# Patient Record
Sex: Male | Born: 1950
Health system: Southern US, Community
[De-identification: ages and names within clinical notes are randomized; demographics above are authoritative.]

## PROBLEM LIST (undated history)

## (undated) DIAGNOSIS — G959 Disease of spinal cord, unspecified: Secondary | ICD-10-CM

## (undated) DIAGNOSIS — I4891 Unspecified atrial fibrillation: Secondary | ICD-10-CM

## (undated) HISTORY — PX: APPENDECTOMY: SHX54

## (undated) HISTORY — PX: BACK SURGERY: SHX140

---

## 2005-10-25 ENCOUNTER — Ambulatory Visit: Payer: Self-pay | Admitting: Family Medicine

## 2005-11-01 ENCOUNTER — Ambulatory Visit: Payer: Self-pay | Admitting: Family Medicine

## 2005-11-16 ENCOUNTER — Ambulatory Visit: Payer: Self-pay | Admitting: Family Medicine

## 2006-09-06 HISTORY — PX: ANTERIOR CERVICAL DECOMP/DISCECTOMY FUSION: SHX1161

## 2018-07-31 DIAGNOSIS — F322 Major depressive disorder, single episode, severe without psychotic features: Secondary | ICD-10-CM | POA: Diagnosis not present

## 2018-07-31 DIAGNOSIS — G4701 Insomnia due to medical condition: Secondary | ICD-10-CM | POA: Diagnosis not present

## 2018-07-31 DIAGNOSIS — R252 Cramp and spasm: Secondary | ICD-10-CM | POA: Diagnosis not present

## 2018-08-16 DIAGNOSIS — F325 Major depressive disorder, single episode, in full remission: Secondary | ICD-10-CM | POA: Diagnosis not present

## 2018-08-16 DIAGNOSIS — G4701 Insomnia due to medical condition: Secondary | ICD-10-CM | POA: Diagnosis not present

## 2018-08-16 DIAGNOSIS — I712 Thoracic aortic aneurysm, without rupture: Secondary | ICD-10-CM | POA: Diagnosis not present

## 2018-08-16 DIAGNOSIS — Z23 Encounter for immunization: Secondary | ICD-10-CM | POA: Diagnosis not present

## 2018-08-16 DIAGNOSIS — R252 Cramp and spasm: Secondary | ICD-10-CM | POA: Diagnosis not present

## 2018-08-16 DIAGNOSIS — Z0000 Encounter for general adult medical examination without abnormal findings: Secondary | ICD-10-CM | POA: Diagnosis not present

## 2018-08-16 DIAGNOSIS — E782 Mixed hyperlipidemia: Secondary | ICD-10-CM | POA: Diagnosis not present

## 2018-08-16 DIAGNOSIS — I480 Paroxysmal atrial fibrillation: Secondary | ICD-10-CM | POA: Diagnosis not present

## 2021-09-06 DIAGNOSIS — M48061 Spinal stenosis, lumbar region without neurogenic claudication: Secondary | ICD-10-CM

## 2021-09-06 HISTORY — DX: Spinal stenosis, lumbar region without neurogenic claudication: M48.061

## 2021-12-03 ENCOUNTER — Emergency Department (HOSPITAL_COMMUNITY): Payer: Medicare HMO

## 2021-12-03 ENCOUNTER — Emergency Department (HOSPITAL_COMMUNITY): Payer: Medicare HMO | Admitting: Certified Registered Nurse Anesthetist

## 2021-12-03 ENCOUNTER — Encounter (HOSPITAL_COMMUNITY): Payer: Self-pay | Admitting: Neurology

## 2021-12-03 ENCOUNTER — Encounter (HOSPITAL_COMMUNITY)
Admission: EM | Disposition: A | Discharge status: Rehabilitation Facility | Payer: Self-pay | Source: Home / Self Care | Attending: Neurology

## 2021-12-03 ENCOUNTER — Other Ambulatory Visit: Payer: Self-pay

## 2021-12-03 ENCOUNTER — Inpatient Hospital Stay (HOSPITAL_COMMUNITY)
Admission: EM | Admit: 2021-12-03 | Discharge: 2021-12-08 | DRG: 023 | Disposition: A | Discharge status: Rehabilitation Facility | Payer: Medicare HMO | Attending: Neurology | Admitting: Neurology

## 2021-12-03 DIAGNOSIS — S01312A Laceration without foreign body of left ear, initial encounter: Secondary | ICD-10-CM | POA: Diagnosis present

## 2021-12-03 DIAGNOSIS — R7301 Impaired fasting glucose: Secondary | ICD-10-CM | POA: Diagnosis present

## 2021-12-03 DIAGNOSIS — I6601 Occlusion and stenosis of right middle cerebral artery: Secondary | ICD-10-CM | POA: Diagnosis not present

## 2021-12-03 DIAGNOSIS — R29810 Facial weakness: Secondary | ICD-10-CM | POA: Diagnosis present

## 2021-12-03 DIAGNOSIS — I712 Thoracic aortic aneurysm, without rupture, unspecified: Secondary | ICD-10-CM

## 2021-12-03 DIAGNOSIS — Z880 Allergy status to penicillin: Secondary | ICD-10-CM

## 2021-12-03 DIAGNOSIS — I63511 Cerebral infarction due to unspecified occlusion or stenosis of right middle cerebral artery: Principal | ICD-10-CM | POA: Diagnosis present

## 2021-12-03 DIAGNOSIS — I7121 Aneurysm of the ascending aorta, without rupture: Secondary | ICD-10-CM | POA: Diagnosis present

## 2021-12-03 DIAGNOSIS — Z20822 Contact with and (suspected) exposure to covid-19: Secondary | ICD-10-CM | POA: Diagnosis present

## 2021-12-03 DIAGNOSIS — H518 Other specified disorders of binocular movement: Secondary | ICD-10-CM | POA: Diagnosis not present

## 2021-12-03 DIAGNOSIS — G8194 Hemiplegia, unspecified affecting left nondominant side: Secondary | ICD-10-CM

## 2021-12-03 DIAGNOSIS — R482 Apraxia: Secondary | ICD-10-CM | POA: Diagnosis present

## 2021-12-03 DIAGNOSIS — I63411 Cerebral infarction due to embolism of right middle cerebral artery: Secondary | ICD-10-CM | POA: Diagnosis present

## 2021-12-03 DIAGNOSIS — J9811 Atelectasis: Secondary | ICD-10-CM | POA: Diagnosis not present

## 2021-12-03 DIAGNOSIS — Z981 Arthrodesis status: Secondary | ICD-10-CM

## 2021-12-03 DIAGNOSIS — M25552 Pain in left hip: Secondary | ICD-10-CM | POA: Diagnosis present

## 2021-12-03 DIAGNOSIS — Z789 Other specified health status: Secondary | ICD-10-CM

## 2021-12-03 DIAGNOSIS — W19XXXA Unspecified fall, initial encounter: Secondary | ICD-10-CM | POA: Diagnosis present

## 2021-12-03 DIAGNOSIS — I4891 Unspecified atrial fibrillation: Secondary | ICD-10-CM

## 2021-12-03 DIAGNOSIS — G936 Cerebral edema: Secondary | ICD-10-CM | POA: Diagnosis not present

## 2021-12-03 DIAGNOSIS — I63421 Cerebral infarction due to embolism of right anterior cerebral artery: Secondary | ICD-10-CM | POA: Diagnosis present

## 2021-12-03 DIAGNOSIS — I480 Paroxysmal atrial fibrillation: Secondary | ICD-10-CM

## 2021-12-03 DIAGNOSIS — R414 Neurologic neglect syndrome: Secondary | ICD-10-CM | POA: Diagnosis present

## 2021-12-03 DIAGNOSIS — Y92012 Bathroom of single-family (private) house as the place of occurrence of the external cause: Secondary | ICD-10-CM

## 2021-12-03 DIAGNOSIS — R29720 NIHSS score 20: Secondary | ICD-10-CM | POA: Diagnosis present

## 2021-12-03 DIAGNOSIS — K5900 Constipation, unspecified: Secondary | ICD-10-CM | POA: Diagnosis present

## 2021-12-03 DIAGNOSIS — D72829 Elevated white blood cell count, unspecified: Secondary | ICD-10-CM | POA: Diagnosis present

## 2021-12-03 DIAGNOSIS — I482 Chronic atrial fibrillation, unspecified: Secondary | ICD-10-CM | POA: Diagnosis present

## 2021-12-03 DIAGNOSIS — R4781 Slurred speech: Secondary | ICD-10-CM | POA: Diagnosis present

## 2021-12-03 DIAGNOSIS — J9601 Acute respiratory failure with hypoxia: Secondary | ICD-10-CM | POA: Diagnosis not present

## 2021-12-03 DIAGNOSIS — D62 Acute posthemorrhagic anemia: Secondary | ICD-10-CM | POA: Diagnosis present

## 2021-12-03 DIAGNOSIS — R471 Dysarthria and anarthria: Secondary | ICD-10-CM | POA: Diagnosis present

## 2021-12-03 DIAGNOSIS — I69391 Dysphagia following cerebral infarction: Secondary | ICD-10-CM | POA: Diagnosis not present

## 2021-12-03 DIAGNOSIS — M48061 Spinal stenosis, lumbar region without neurogenic claudication: Secondary | ICD-10-CM | POA: Diagnosis present

## 2021-12-03 DIAGNOSIS — G8114 Spastic hemiplegia affecting left nondominant side: Secondary | ICD-10-CM | POA: Diagnosis present

## 2021-12-03 DIAGNOSIS — Z87891 Personal history of nicotine dependence: Secondary | ICD-10-CM

## 2021-12-03 DIAGNOSIS — G479 Sleep disorder, unspecified: Secondary | ICD-10-CM | POA: Diagnosis not present

## 2021-12-03 DIAGNOSIS — L7632 Postprocedural hematoma of skin and subcutaneous tissue following other procedure: Secondary | ICD-10-CM | POA: Diagnosis not present

## 2021-12-03 DIAGNOSIS — I700 Atherosclerosis of aorta: Secondary | ICD-10-CM | POA: Diagnosis present

## 2021-12-03 DIAGNOSIS — E785 Hyperlipidemia, unspecified: Secondary | ICD-10-CM | POA: Diagnosis present

## 2021-12-03 DIAGNOSIS — R1312 Dysphagia, oropharyngeal phase: Secondary | ICD-10-CM | POA: Diagnosis not present

## 2021-12-03 DIAGNOSIS — I639 Cerebral infarction, unspecified: Secondary | ICD-10-CM | POA: Diagnosis not present

## 2021-12-03 DIAGNOSIS — R1310 Dysphagia, unspecified: Secondary | ICD-10-CM | POA: Diagnosis present

## 2021-12-03 DIAGNOSIS — K5901 Slow transit constipation: Secondary | ICD-10-CM | POA: Diagnosis not present

## 2021-12-03 HISTORY — PX: RADIOLOGY WITH ANESTHESIA: SHX6223

## 2021-12-03 HISTORY — PX: IR PERCUTANEOUS ART THROMBECTOMY/INFUSION INTRACRANIAL INC DIAG ANGIO: IMG6087

## 2021-12-03 HISTORY — PX: IR CT HEAD LTD: IMG2386

## 2021-12-03 HISTORY — DX: Disease of spinal cord, unspecified: G95.9

## 2021-12-03 HISTORY — DX: Unspecified atrial fibrillation: I48.91

## 2021-12-03 LAB — I-STAT CHEM 8, ED
BUN: 21 mg/dL (ref 8–23)
Calcium, Ion: 1.18 mmol/L (ref 1.15–1.40)
Chloride: 105 mmol/L (ref 98–111)
Creatinine, Ser: 1 mg/dL (ref 0.61–1.24)
Glucose, Bld: 133 mg/dL — ABNORMAL HIGH (ref 70–99)
HCT: 42 % (ref 39.0–52.0)
Hemoglobin: 14.3 g/dL (ref 13.0–17.0)
Potassium: 4.4 mmol/L (ref 3.5–5.1)
Sodium: 139 mmol/L (ref 135–145)
TCO2: 27 mmol/L (ref 22–32)

## 2021-12-03 LAB — DIFFERENTIAL
Abs Immature Granulocytes: 0.05 10*3/uL (ref 0.00–0.07)
Basophils Absolute: 0.1 10*3/uL (ref 0.0–0.1)
Basophils Relative: 1 %
Eosinophils Absolute: 0.1 10*3/uL (ref 0.0–0.5)
Eosinophils Relative: 1 %
Immature Granulocytes: 1 %
Lymphocytes Relative: 12 %
Lymphs Abs: 1.2 10*3/uL (ref 0.7–4.0)
Monocytes Absolute: 0.7 10*3/uL (ref 0.1–1.0)
Monocytes Relative: 7 %
Neutro Abs: 8.2 10*3/uL — ABNORMAL HIGH (ref 1.7–7.7)
Neutrophils Relative %: 78 %

## 2021-12-03 LAB — CBC
HCT: 42.6 % (ref 39.0–52.0)
Hemoglobin: 14.4 g/dL (ref 13.0–17.0)
MCH: 29.8 pg (ref 26.0–34.0)
MCHC: 33.8 g/dL (ref 30.0–36.0)
MCV: 88 fL (ref 80.0–100.0)
Platelets: 199 10*3/uL (ref 150–400)
RBC: 4.84 MIL/uL (ref 4.22–5.81)
RDW: 12.5 % (ref 11.5–15.5)
WBC: 10.3 10*3/uL (ref 4.0–10.5)
nRBC: 0 % (ref 0.0–0.2)

## 2021-12-03 LAB — COMPREHENSIVE METABOLIC PANEL
ALT: 24 U/L (ref 0–44)
AST: 28 U/L (ref 15–41)
Albumin: 4 g/dL (ref 3.5–5.0)
Alkaline Phosphatase: 67 U/L (ref 38–126)
Anion gap: 8 (ref 5–15)
BUN: 19 mg/dL (ref 8–23)
CO2: 24 mmol/L (ref 22–32)
Calcium: 9.4 mg/dL (ref 8.9–10.3)
Chloride: 106 mmol/L (ref 98–111)
Creatinine, Ser: 1.02 mg/dL (ref 0.61–1.24)
GFR, Estimated: >60 mL/min (ref >60)
Glucose, Bld: 134 mg/dL — ABNORMAL HIGH (ref 70–99)
Potassium: 4.4 mmol/L (ref 3.5–5.1)
Sodium: 138 mmol/L (ref 135–145)
Total Bilirubin: 0.9 mg/dL (ref 0.3–1.2)
Total Protein: 6.2 g/dL — ABNORMAL LOW (ref 6.5–8.1)

## 2021-12-03 LAB — PROTIME-INR
INR: 1.2 (ref 0.8–1.2)
Prothrombin Time: 14.8 seconds (ref 11.4–15.2)

## 2021-12-03 LAB — RESP PANEL BY RT-PCR (FLU A&B, COVID) ARPGX2
Influenza A by PCR: NEGATIVE
Influenza B by PCR: NEGATIVE
SARS Coronavirus 2 by RT PCR: NEGATIVE

## 2021-12-03 LAB — MRSA NEXT GEN BY PCR, NASAL: MRSA by PCR Next Gen: NOT DETECTED

## 2021-12-03 LAB — APTT: aPTT: 28 seconds (ref 24–36)

## 2021-12-03 LAB — CBG MONITORING, ED: Glucose-Capillary: 131 mg/dL — ABNORMAL HIGH (ref 70–99)

## 2021-12-03 SURGERY — IR WITH ANESTHESIA
Anesthesia: General

## 2021-12-03 MED ORDER — SENNOSIDES-DOCUSATE SODIUM 8.6-50 MG PO TABS: 1.0000 | ORAL_TABLET | Freq: Every evening | ORAL | Status: DC | PRN

## 2021-12-03 MED ORDER — DEXAMETHASONE SODIUM PHOSPHATE 10 MG/ML IJ SOLN
INTRAMUSCULAR | Status: DC | PRN
Start: 2021-12-03 — End: 2021-12-03
  Administered 2021-12-03: 10 mg via INTRAVENOUS

## 2021-12-03 MED ORDER — FENTANYL CITRATE PF 50 MCG/ML IJ SOSY
25.0000 ug | PREFILLED_SYRINGE | INTRAMUSCULAR | Status: DC | PRN
  Administered 2021-12-03: 25 ug via INTRAVENOUS

## 2021-12-03 MED ORDER — POLYETHYLENE GLYCOL 3350 17 G PO PACK: 17.0000 g | PACK | Freq: Every day | ORAL | Status: DC

## 2021-12-03 MED ORDER — LIDOCAINE 2% (20 MG/ML) 5 ML SYRINGE
INTRAMUSCULAR | Status: DC | PRN
  Administered 2021-12-03: 100 mg via INTRAVENOUS

## 2021-12-03 MED ORDER — ASPIRIN 81 MG PO CHEW
CHEWABLE_TABLET | ORAL | Status: AC
  Filled 2021-12-03: qty 1

## 2021-12-03 MED ORDER — PHENYLEPHRINE HCL-NACL 20-0.9 MG/250ML-% IV SOLN
INTRAVENOUS | Status: DC | PRN
Start: 2021-12-03 — End: 2021-12-03
  Administered 2021-12-03: 25 ug/min via INTRAVENOUS

## 2021-12-03 MED ORDER — ACETAMINOPHEN 325 MG PO TABS
650.0000 mg | ORAL_TABLET | ORAL | Status: DC | PRN
  Administered 2021-12-05 – 2021-12-06 (×2): 650 mg via ORAL
  Filled 2021-12-03 (×3): qty 2

## 2021-12-03 MED ORDER — FENTANYL CITRATE (PF) 250 MCG/5ML IJ SOLN
INTRAMUSCULAR | Status: DC | PRN
  Administered 2021-12-03: 100 ug via INTRAVENOUS

## 2021-12-03 MED ORDER — TICAGRELOR 90 MG PO TABS
ORAL_TABLET | ORAL | Status: AC
  Filled 2021-12-03: qty 2

## 2021-12-03 MED ORDER — PROPOFOL 10 MG/ML IV BOLUS
INTRAVENOUS | Status: DC | PRN
  Administered 2021-12-03: 100 mg via INTRAVENOUS

## 2021-12-03 MED ORDER — PROPOFOL 500 MG/50ML IV EMUL
INTRAVENOUS | Status: DC | PRN
  Administered 2021-12-03: 50 ug/kg/min via INTRAVENOUS

## 2021-12-03 MED ORDER — CEFAZOLIN SODIUM-DEXTROSE 2-4 GM/100ML-% IV SOLN
INTRAVENOUS | Status: AC
Start: 2021-12-03 — End: 2021-12-04
  Filled 2021-12-03: qty 100

## 2021-12-03 MED ORDER — PROPOFOL 500 MG/50ML IV EMUL
INTRAVENOUS | Status: AC
  Filled 2021-12-03: qty 50

## 2021-12-03 MED ORDER — IOHEXOL 300 MG/ML  SOLN
150.0000 mL | Freq: Once | INTRAMUSCULAR | Status: AC | PRN
  Administered 2021-12-03: 48 mL via INTRA_ARTERIAL

## 2021-12-03 MED ORDER — ACETAMINOPHEN 650 MG RE SUPP: 650.0000 mg | RECTAL | Status: DC | PRN

## 2021-12-03 MED ORDER — CANGRELOR TETRASODIUM 50 MG IV SOLR
INTRAVENOUS | Status: AC
  Filled 2021-12-03: qty 50

## 2021-12-03 MED ORDER — SUCCINYLCHOLINE CHLORIDE 200 MG/10ML IV SOSY
PREFILLED_SYRINGE | INTRAVENOUS | Status: DC | PRN
  Administered 2021-12-03: 120 mg via INTRAVENOUS

## 2021-12-03 MED ORDER — FENTANYL CITRATE PF 50 MCG/ML IJ SOSY
25.0000 ug | PREFILLED_SYRINGE | INTRAMUSCULAR | Status: DC | PRN
  Filled 2021-12-03: qty 1

## 2021-12-03 MED ORDER — ROCURONIUM BROMIDE 10 MG/ML (PF) SYRINGE
PREFILLED_SYRINGE | INTRAVENOUS | Status: DC | PRN
  Administered 2021-12-03: 100 mg via INTRAVENOUS

## 2021-12-03 MED ORDER — STROKE: EARLY STAGES OF RECOVERY BOOK: Freq: Once | Status: AC

## 2021-12-03 MED ORDER — TENECTEPLASE FOR STROKE
0.2500 mg/kg | PACK | Freq: Once | INTRAVENOUS | Status: AC
  Administered 2021-12-03: 24 mg via INTRAVENOUS
  Filled 2021-12-03: qty 10

## 2021-12-03 MED ORDER — CEFAZOLIN SODIUM-DEXTROSE 2-3 GM-%(50ML) IV SOLR
INTRAVENOUS | Status: DC | PRN
  Administered 2021-12-03: 2 g via INTRAVENOUS

## 2021-12-03 MED ORDER — LACTATED RINGERS IV SOLN: INTRAVENOUS | Status: DC | PRN

## 2021-12-03 MED ORDER — DOCUSATE SODIUM 50 MG/5ML PO LIQD: 100.0000 mg | Freq: Two times a day (BID) | ORAL | Status: DC

## 2021-12-03 MED ORDER — SODIUM CHLORIDE 0.9 % IV SOLN: INTRAVENOUS | Status: DC | PRN

## 2021-12-03 MED ORDER — SODIUM CHLORIDE 0.9% FLUSH: 3.0000 mL | Freq: Once | INTRAVENOUS | Status: DC

## 2021-12-03 MED ORDER — PANTOPRAZOLE SODIUM 40 MG IV SOLR
40.0000 mg | Freq: Every day | INTRAVENOUS | Status: DC
  Administered 2021-12-04 – 2021-12-06 (×3): 40 mg via INTRAVENOUS
  Filled 2021-12-03 (×3): qty 10

## 2021-12-03 MED ORDER — PROPOFOL 1000 MG/100ML IV EMUL
0.0000 ug/kg/min | INTRAVENOUS | Status: DC
  Administered 2021-12-03 – 2021-12-04 (×2): 35 ug/kg/min via INTRAVENOUS
  Administered 2021-12-04: 25 ug/kg/min via INTRAVENOUS
  Filled 2021-12-03 (×3): qty 100

## 2021-12-03 MED ORDER — ONDANSETRON HCL 4 MG/2ML IJ SOLN
INTRAMUSCULAR | Status: DC | PRN
  Administered 2021-12-03: 4 mg via INTRAVENOUS

## 2021-12-03 MED ORDER — IOHEXOL 350 MG/ML SOLN
75.0000 mL | Freq: Once | INTRAVENOUS | Status: AC | PRN
  Administered 2021-12-03: 75 mL via INTRAVENOUS

## 2021-12-03 MED ORDER — ACETAMINOPHEN 160 MG/5ML PO SOLN: 650.0000 mg | ORAL | Status: DC | PRN

## 2021-12-03 MED ORDER — NITROGLYCERIN 1 MG/10 ML FOR IR/CATH LAB
INTRA_ARTERIAL | Status: AC
  Administered 2021-12-03: 50 ug via INTRA_ARTERIAL
  Administered 2021-12-03: 25 ug via INTRA_ARTERIAL
  Filled 2021-12-03: qty 10

## 2021-12-03 MED ORDER — EPTIFIBATIDE 20 MG/10ML IV SOLN
INTRAVENOUS | Status: AC
  Filled 2021-12-03: qty 10

## 2021-12-03 MED ORDER — CLOPIDOGREL BISULFATE 300 MG PO TABS
ORAL_TABLET | ORAL | Status: AC
  Filled 2021-12-03: qty 1

## 2021-12-03 MED ORDER — SODIUM CHLORIDE 0.9 % IV SOLN: INTRAVENOUS | Status: DC

## 2021-12-03 MED ORDER — FENTANYL CITRATE (PF) 100 MCG/2ML IJ SOLN
INTRAMUSCULAR | Status: AC
  Filled 2021-12-03: qty 2

## 2021-12-03 MED ORDER — VERAPAMIL HCL 2.5 MG/ML IV SOLN
INTRAVENOUS | Status: AC
  Filled 2021-12-03: qty 2

## 2021-12-03 MED ORDER — FENTANYL CITRATE PF 50 MCG/ML IJ SOSY: 25.0000 ug | PREFILLED_SYRINGE | INTRAMUSCULAR | Status: DC | PRN

## 2021-12-03 NOTE — Transfer of Care (Signed)
Immediate Anesthesia Transfer of Care Note ? ?Patient: Patrick Rose ? ?Procedure(s) Performed: IR WITH ANESTHESIA ? ?Patient Location: ICU ? ?Anesthesia Type:General ? ?Level of Consciousness: unresponsive and Patient remains intubated per anesthesia plan ? ?Airway & Oxygen Therapy: Patient remains intubated per anesthesia plan and Patient placed on Ventilator (see vital sign flow sheet for setting) ? ?Post-op Assessment: Report given to RN and Post -op Vital signs reviewed and stable ? ?Post vital signs: Reviewed and stable ? ?Last Vitals:  ?Vitals Value Taken Time  ?BP 162/86 12/03/21 2038  ?Temp    ?Pulse 67 12/03/21 2045  ?Resp 16 12/03/21 2045  ?SpO2 98 % 12/03/21 2045  ?Vitals shown include unvalidated device data. ? ?Last Pain:  ?Vitals:  ? 12/03/21 1923  ?PainSc: 0-No pain  ?   ? ?  ? ?Complications: No notable events documented. ?

## 2021-12-03 NOTE — Consult Note (Signed)
? ?NAME:  Patrick Rose, MRN:  [id number], DOB:  [birth year], LOS: 0 ?ADMISSION DATE:  12/03/2021 CONSULTATION DATE:  12/03/2021 ?REFERRING MD:  Corliss Skains - NIR CHIEF COMPLAINT:  R MCA stroke  ? ?History of Present Illness:  ?71 year old man who presented to [facility] ED 3/30 as a Code Stroke due to L hemiplegia and R gaze deviation. LKW 1700 3/30. PMHx significant Afib (CHA2DS2-VASc 1, not on AC), aortic aneurysm (stable).  ? ?Per chart review patient developed acute left hemiplegia and right gaze deviation with slurred speech around 1703/30.  CT head did not demonstrate ICH but did show hyperdense R MCA.  TNK was administered.  CTA head and neck demonstrated right M1 cutoff. Patient was taken for R common carotid arteriogram via R common femoral approach.  Findings included occluded right MCA M1 segment.  Underwent IR mechanical thrombectomy (Dr. Corliss Skains) with complete revascularization of the R MCA M1 occlusion with 1 pass (TICI 3 revascularization). ? ?Patient remained intubated postprocedure and PCCM was consulted for ventilator management ? ?Pertinent Medical History:  ?Afib (not on [facility]), stable aortic aneurysm ? ?Significant Hospital Events: ?Including procedures, antibiotic start and stop dates in addition to other pertinent events   ?3/30 - Presented to [facility] ED as Code Stroke; LKW 1700. CT Head with hyperdense R MCA. CTA Head/Neck with R MCA M1 occlusion. Underwent mechanical thrombectomy with TICI 3 revascularization. ? ?Interim History / Subjective:  ?PCCM consulted post-IR mechanical thrombectomy, as patient left intubated ? ?Objective:  ?Blood pressure 99/71, pulse 69, resp. rate 16, height 6\' 1"  (1.854 m), weight 96.4 kg, SpO2 98 %. ?   ?Vent Mode: PRVC ?FiO2 (%):  [60 %] 60 % ?Set Rate:  [16 bmp] 16 bmp ?Vt Set:  mL] 640 mL ?PEEP:  [5 cmH20] 5 cmH20 ?Plateau Pressure:  [17 cmH20] 17 cmH20  ? ?Intake/Output Summary (Last 24 hours) at 12/03/2021 2343 ?Last data filed at 12/03/2021 2042 ?Gross per 24 hour   ?Intake 1000 ml  ?Output --  ?Net 1000 ml  ? ?Filed Weights  ? 12/03/21 1800 12/03/21 1929  ?Weight: 96.4 kg 96.4 kg  ? ?Physical Examination: ?General: Acutely ill-appearing elderly man in NAD. ?HEENT: /AT, anicteric sclera, PERRL 9mm, moist mucous membranes. L ear with small triangular laceration with active oozing. ?Neuro: Sedated. Withdraws to pain in all 4 extremities. Not following commands. Moves all 4 extremities spontaneously. +Cough and +Gag  ?CV: RRR, no m/g/r. ?PULM: Breathing even and unlabored on vent (PEEP 5, FiO2 60%). Lung fields CTAB. ?GI: Soft, nontender, nondistended. Normoactive bowel sounds. ?Extremities: No LE edema noted. +DP pulses present and equal. ?Skin: Warm/dry, no rashes. ? ?Resolved Hospital Problem List:  ? ? ?Assessment & Plan:  ?Ischemic stroke 2/2 R MCA M1 occlusion ?- Admit to 4N Neuro ICU ?- S/p NIR intervention (Dr. 1m), R MCA M1 recanalization (TICI 3) ?- Goal SBP 140-160 ?- Repeat CT post-TNK/intervention per Neuro/NIR ?- MRI Brain per Neuro ?- Neuro checks Q4H ?- Neuroprotective measures: HOB > 30 degrees, normoglycemia, normothermia, electrolytes WNL ? ?Acute hypoxemic respiratory failure 2/2 procedure ?- Continue full vent support (4-8cc/kg IBW) ?- Wean FiO2 for O2 sat > 90% ?- Daily WUA/SBT ?- VAP bundle ?- Pulmonary hygiene ?- PAD protocol for sedation: Propofol and Fentanyl for goal RASS 0 to -1 ? ?Atrial fibrillation ?CHA2DS2-VASc score 3 (1 prior to stroke, not on [facility] ?- NSR at present ?- Cardiac monitoring ?- Discuss AC initiation 24H post-procedure, given history of ischemic stroke and CHA2DS2-VASc 3 (moderate-high risk) ? ?  AAA, stable ?Stable at several recent checks ?- Continue monitoring ? ?Best Practice: (right click and "Reselect all SmartList Selections" daily)  ? ?Per Primary Team ? ?Labs:  ?CBC: ?Recent Labs  ?Lab 12/03/21 ?1835 12/03/21 ?1837  ?WBC 10.3  --   ?NEUTROABS 8.2*  --   ?HGB 14.4 14.3  ?HCT 42.6 42.0  ?MCV 88.0  --   ?PLT 199  --    ? ?Basic Metabolic Panel: ?Recent Labs  ?Lab 12/03/21 ?1835 12/03/21 ?1837  ?NA 138 139  ?K 4.4 4.4  ?CL 106 105  ?CO2 24  --   ?GLUCOSE 134* 133*  ?BUN 19 21  ?CREATININE 1.02 1.00  ?CALCIUM 9.4  --   ? ?GFR: ?Estimated Creatinine Clearance: 82.9 mL/min (by C-G formula based on SCr of 1 mg/dL). ?Recent Labs  ?Lab 12/03/21 ?1835  ?WBC 10.3  ? ?Liver Function Tests: ?Recent Labs  ?Lab 12/03/21 ?1835  ?AST 28  ?ALT 24  ?ALKPHOS 67  ?BILITOT 0.9  ?PROT 6.2*  ?ALBUMIN 4.0  ? ?No results for input(s): LIPASE, AMYLASE in the last 168 hours. ?No results for input(s): AMMONIA in the last 168 hours. ? ?ABG: ?   ?Component Value Date/Time  ? TCO2 27 12/03/2021 1837  ?  ?Coagulation Profile: ?Recent Labs  ?Lab 12/03/21 ?1835  ?INR 1.2  ? ?Cardiac Enzymes: ?No results for input(s): CKTOTAL, CKMB, CKMBINDEX, TROPONINI in the last 168 hours. ? ?HbA1C: ?No results found for: HGBA1C ? ?CBG: ?Recent Labs  ?Lab 12/03/21 ?1831  ?GLUCAP 131*  ? ?Review of Systems:   ?Patient is encephalopathic and/or intubated. Therefore history has been obtained from chart review.  ? ?Past Medical History:  ?Afib, AAA (stable) ? ?Surgical History:  ?History reviewed. No pertinent surgical history.  ? ?Social History:  ?   ? ?Family History:  ?His family history is not on file.  ? ?Allergies: ?Allergies  ?Allergen Reactions  ? Amoxicillin-Pot Clavulanate Rash  ? Penicillins Hives and Rash  ?  ?Home Medications: ?Prior to Admission medications   ?Not on File  ?  ?Critical care time: 42 minutes  ? ?Tim Lair, PA-C ?[facility] ?12/03/21 11:43 PM ? ?Please see [email or web address] for pager details. ? ?From 7A-7P if no response, please call [phone] ?After hours, please call ELink [phone]  ?

## 2021-12-03 NOTE — H&P (Signed)
NEUROLOGY H&P  ? ?Date of service: December 03, 2021 ?Patient Name: Patrick Rose ?MRN:  [id number] ?DOB:  1951-06-13 ?Reason for admission: stroke code L hemiplegia, R gaze deviation ?_ _ _   _ __   _ __ _ _  __ __   _ __   __ _ ? ?History of Present Illness  ? ?CONSENT FOR TNK AND IR MECHANICAL THROMBECTOMY OBTAINED FROM WIFE [place] BY PHONE [phone] ? ?This is a 71 yo man with hx a fib on daily aspirin 81 mg only, stable aortic aneurysm, who presented as a stroke code pre-activated by [facility] for left hemiplegia and right gaze deviation.  Last known well was 5 PM today. Wife reports that he is extremely healthy at baseline, is an avid biker, and has no baseline deficits.  The only medication he takes on a daily basis is aspirin 81 g daily.  He is not on any anticoagulation for A-fib per chart review because his CHA2DS2-VASc score was 1. ? ?At 5 PM he developed acute left hemiplegia with right gaze deviation and slurred speech. NIHSS = 15, see below. Head CT showed no ICH but did show hyperdense R MCA.  2/2 lethargy which worsened over the course of the stroke code, patient was unable to consent for himself.  I discussed the risks and benefits of TNK with wife by phone who confirmed he met inclusion criteria, did not meet exclusion criteria, and gave informed consent to administer TNK. Aortic aneurysm stable per wife on past several check ups. CTA H&N showed R M1 cutoff.  Benefits and risks of IR mechanical thrombectomy reviewed with wife including 10% risk of ICH and she gave informed consent to proceed with IR procedure. Case d/w Dr. Estanislado Pandy and code IR was activated. Patient is currently in IR and will be admitted to Neuro ICU after the procedure. ? ?CNS imaging personally reviewed and discussed by phone with neuroradiology and neuro-interventionalist. ? ?ROS  ? ?UTA 2/2 mental status ? ?Past History  ? ?I have reviewed the following: ? ?Pmhx - aortic aneurysm, stable on past 3 checkups per  wife ?- a fib not on anticoagulation 2/2 CHA2DS2-VASc score of 1 ? ?Daily medications at home - only ASA 69m daily ? ?Allergies - penicillin causes a rash ? ?Medications  ? ?(Not in a hospital admission) ?  ? ? ?Current Facility-Administered Medications:  ??   stroke: mapping our early stages of recovery book, , Does not apply, Once, SDerek Jack MD ??  0.9 %  sodium chloride infusion, , Intravenous, Continuous, SDerek Jack MD ??  acetaminophen (TYLENOL) tablet 650 mg, 650 mg, Oral, Q4H PRN **OR** acetaminophen (TYLENOL) 160 MG/5ML solution 650 mg, 650 mg, Per Tube, Q4H PRN **OR** acetaminophen (TYLENOL) suppository 650 mg, 650 mg, Rectal, Q4H PRN, SDerek Jack MD ??  aspirin 81 MG chewable tablet, , , ,  ??  cangrelor (KENGREAL) 50 MG SOLR, , , ,  ??  ceFAZolin (ANCEF) 2-4 GM/100ML-% IVPB, , , ,  ??  clopidogrel (PLAVIX) 300 MG tablet, , , ,  ??  eptifibatide (INTEGRILIN) 20 MG/10ML injection, , , ,  ??  iohexol (OMNIPAQUE) 300 MG/ML solution 150 mL, 150 mL, Intra-arterial, Once PRN, DLuanne Bras MD ??  nitroGLYCERIN 100 mcg/mL intra-arterial injection, , , ,  ??  pantoprazole (PROTONIX) injection 40 mg, 40 mg, Intravenous, QHS, SDerek Jack MD ??  senna-docusate (Senokot-S) tablet 1 tablet, 1 tablet, Oral, QHS PRN, SDerek Jack MD ??  sodium chloride flush (NS) 0.9 % injection 3 mL, 3 mL, Intravenous, Once, Horton, Kristie M, DO ??  ticagrelor (BRILINTA) 90 MG tablet, , , ,  ??  verapamil (ISOPTIN) 2.5 MG/ML injection, , , ,  ?No current outpatient medications on file. ? ?Facility-Administered Medications Ordered in Other Encounters:  ??  0.9 %  sodium chloride infusion, , Intravenous, Continuous PRN, Janene Harvey, CRNA, New Bag at 12/03/21 1910 ??  ceFAZolin (ANCEF) IVPB 2 g/50 mL premix, , Intravenous, Anesthesia Intra-op, Eligha Bridegroom, CRNA, 2 g at 12/03/21 1938 ??  fentaNYL citrate (PF) (SUBLIMAZE) injection, , Intravenous, Anesthesia Intra-op, Janene Harvey,  CRNA, 100 mcg at 12/03/21 1913 ??  lactated ringers infusion, , Intravenous, Continuous PRN, Janene Harvey, CRNA, New Bag at 12/03/21 1910 ??  lidocaine 2% (20 mg/mL) 5 mL syringe, , Intravenous, Anesthesia Intra-op, Janene Harvey, CRNA, 100 mg at 12/03/21 1912 ??  phenylephrine (NEO-SYNEPHRINE) 67m/NS 2564mpremix infusion, , Intravenous, Continuous PRN, WaJanene HarveyCRNA, Last Rate: 18.75 mL/hr at 12/03/21 1927, 25 mcg/min at 12/03/21 1927 ??  propofol (DIPRIVAN) 10 mg/mL bolus/IV push, , Intravenous, Anesthesia Intra-op, WaJanene HarveyCRNA, 100 mg at 12/03/21 1914 ??  rocuronium bromide 10 mg/mL (PF) syringe, , Intravenous, Anesthesia Intra-op, WaJanene HarveyCRNA, 100 mg at 12/03/21 1923 ??  succinylcholine (ANECTINE) syringe, , Intravenous, Anesthesia Intra-op, WaJanene HarveyCRNA, 120 mg at 12/03/21 1914 ? ?Vitals  ? ?Vitals:  ? 12/03/21 1837 12/03/21 1855 12/03/21 1909 12/03/21 1929  ?BP: (!) 120/96 125/85 (!) 152/94   ?Pulse: (!) 54     ?Resp: 18     ?SpO2: 95%     ?Weight:    96.4 kg  ?Height:    6' 1"  (1.854 m)  ?  ? ?Body mass index is 28.04 kg/m?. ? ?Physical Exam  ? ?Physical Exam ?Gen: drowsy, oriented to name, age, month ?Resp: increased WOB on NRB ?CV: irregularly irregular ? ?Neuro: ?*MS: drowsy, oriented to name, age, month. Follows simple commands ?*Speech: severe dysarthria, able to name and repeat ?*CN: PERRL, blinks to threat bilat, R gaze deviation, L UMN facial droop, hearing intact to voice ?*Motor: 5/5 strength no drift RUE and RLE. 0/5 LUE. Some movement against gravity LLE. ?*Sensory: impaired to LT LUE and LLE. L hemineglect ?*Coordination: intact FNF on R ?*Reflexes: 1+ symmetric throughout ?*Gait: UTA ? ?NIHSS ? ?1a Level of Conscious.: 1 ?1b LOC Questions: 0 ?1c LOC Commands: 0 ?2 Best Gaze: 2 ?3 Visual: 0 ?4 Facial Palsy: 2 ?5a Motor Arm - left: 4 ?5b Motor Arm - Right: 0 ?6a Motor Leg - Left: 2 ?6b Motor Leg - Right: 0 ?7 Limb Ataxia:  0 ?8 Sensory: 1 ?9 Best Language: 0 ?10 Dysarthria: 2 ?11 Extinct. and Inatten.: 1 ? ?TOTAL: 15 ? ? ?Premorbid mRS = 0 ? ? ?Labs  ? ?CBC:  ?Recent Labs  ?Lab 12/03/21 ?1835 12/03/21 ?1837  ?WBC 10.3  --   ?NEUTROABS 8.2*  --   ?HGB 14.4 14.3  ?HCT 42.6 42.0  ?MCV 88.0  --   ?PLT 199  --   ? ? ? ?Basic Metabolic Panel:  ?Lab Results  ?Component Value Date  ? NA 139 12/03/2021  ? K 4.4 12/03/2021  ? CO2 24 12/03/2021  ? GLUCOSE 133 (H) 12/03/2021  ? BUN 21 12/03/2021  ? CREATININE 1.00 12/03/2021  ? CALCIUM 9.4 12/03/2021  ? GFRNONAA >60 12/03/2021  ? ?Lipid Panel: No results found for: LDGreenwoodHgbA1c: No results  found for: HGBA1C ?Urine Drug Screen: No results found for: LABOPIA, COCAINSCRNUR, [place], [place], THCU, LABBARB  ?Alcohol Level No results found for: ETH ? ? ?Impression  ? ?This is a 71 yo man with hx a fib on daily aspirin 81 mg only, stable aortic aneurysm, who presented as a stroke code pre-activated by [facility] for left hemiplegia and right gaze deviation 2/2 evolving R MCA ischemic infarct 2/2 R M1 occlusion. S/p TNK. Currently in IR for mechanical thrombectomy ? ?Recommendations  ? ?# Acute ischemic R MCA stroke 2/2 M1 occlusion, s/p TNK and mechanical thrombectomy ?- Admit to [facility] ICU under Dr. Quinn Axe ?- Routine post Thrombolytic monitoring including neuro checks and blood pressure control during/after treatment Monitor blood pressure Check blood pressure and neuro assessment every 15 min for 2 h, then every 30 min for 6 h, and finally every hour for 16 h. ?- Manage Blood Pressure per post Thrombolytic protocol and IR BP parameters. Avoid oral antihypertensives ?- CT brain 24 hours post Thrombolytic ?- NPO until swallowing screen performed and passed ?- No antiplatelet agents or anticoagulants (including heparin for DVT prophylaxis) in first 24 hours ?- No Foley catheter, nasogastric tube, arterial catheter or central venous catheter for 24 hr, unless absolutely necessary ?- Telemetry ?- MRI  brain wo ?- TTE w/ bubble ?- Check A1c and LDL + add statin per guidelines ?- q4 hr neuro checks ?- STAT head CT for any change in neuro exam ?- PT/OT ?- Stroke education ?- Amb referral to neurology upon discha

## 2021-12-03 NOTE — Progress Notes (Signed)
eLink Physician-Brief Progress Note ?Patient Name: Patrick Rose ?DOB: 06-29-1951 ?MRN: [id number] ? ? ?Date of Service ? 12/03/2021  ?HPI/Events of Note ? Patient transferred to the ICU s/p IR thrombectomy  of a right MCA occlusion.  ?eICU Interventions ? New Patient Evaluation.  ? ? ? ?  ? ?Thomasene Lot Jule Whitsel ?12/03/2021, 10:00 PM ?

## 2021-12-03 NOTE — Code Documentation (Signed)
Stroke Response Nurse Documentation ?Code Documentation ? ?Patrick Rose is a 71 y.o. male arriving to [facility]  via [facility] on 12/03/21 with little past known medical history. On aspirin 325 mg daily. Code stroke was activated by EMS.  ? ?Patient from home where he was LKW at 1700. Wife noted that he fell in the bathroom and she was having difficulties helping him up so she called EMS. When EMS arrived they noted left sided neglect, weakness, and facial droop. ? ?Stroke team at the bedside on patient arrival. Labs drawn and patient cleared for CT by EDP. Patient to CT with team. NIHSS 20, see documentation for details and code stroke times. Patient with decreased LOC, right gaze preference , left facial droop, left arm weakness, left leg weakness, left decreased sensation, dysarthria , and Sensory  neglect on exam. The following imaging was completed:  CT Head and CTA. Patient is a candidate for IV Thrombolytic and received TNK at 1854. Patient is a candidate for IR due to positive LVO and arrived in IR suite at 1901. ? ?Care Plan: Patient bed requested in 4N-ICU, NIHSS q15 x2 hours, q30 x6 hours, q1 x16 hours. Post IR- follow sheath protocol.  ? ?Bedside handoff with IR and ED RN ? ?Meda Klinefelter  ?Stroke Response RN ? ? ?

## 2021-12-03 NOTE — Consult Note (Deleted)
NEUROLOGY H&P   Date of service: December 03, 2021 Patient Name: Patrick Rose MRN:  [id number] DOB:  12-28-1950 Reason for admission: stroke code L hemiplegia, R gaze deviation _ _ _   _ __   _ __ _ _  __ __   _ __   __ _  History of Present Illness   CONSENT FOR TNK AND IR MECHANICAL THROMBECTOMY OBTAINED FROM WIFE REBECCA Cousins BY PHONE [phone]  This is a 71 yo man with hx a fib on daily aspirin 81 mg only, stable aortic aneurysm, who presented as a stroke code pre-activated by [facility] for left hemiplegia and right gaze deviation.  Last known well was 5 PM today. Wife reports that he is extremely healthy at baseline, is an avid biker, and has no baseline deficits.  The only medication he takes on a daily basis is aspirin 81 g daily.  He is not on any anticoagulation for A-fib per chart review because his CHA2DS2-VASc score was 1.  At 5 PM he developed acute left hemiplegia with right gaze deviation and slurred speech. NIHSS = 15, see below. Head CT showed no ICH but did show hyperdense R MCA.  2/2 lethargy which worsened over the course of the stroke code, patient was unable to consent for himself.  I discussed the risks and benefits of TNK with wife by phone who confirmed he met inclusion criteria, did not meet exclusion criteria, and gave informed consent to administer TNK. Aortic aneurysm stable per wife on past several check ups. CTA H&N showed R M1 cutoff.  Benefits and risks of IR mechanical thrombectomy reviewed with wife including 10% risk of ICH and she gave informed consent to proceed with IR procedure. Code IR was activated. Patient is currently in IR and will be admitted to Neuro ICU after the procedure.  ROS   UTA 2/2 mental status  Past History   I have reviewed the following:  Pmhx - aortic aneurysm, stable on past 3 checkups per wife - a fib not on anticoagulation 2/2 CHA2DS2-VASc score of 1  Daily medications at home - only ASA 81mg  daily  Allergies -  penicillin causes a rash  Medications   (Not in a hospital admission)     Current Facility-Administered Medications:     stroke: mapping our early stages of recovery book, , Does not apply, Once, Jefferson Fuel, MD   0.9 %  sodium chloride infusion, , Intravenous, Continuous, Jefferson Fuel, MD   acetaminophen (TYLENOL) tablet 650 mg, 650 mg, Oral, Q4H PRN **OR** acetaminophen (TYLENOL) 160 MG/5ML solution 650 mg, 650 mg, Per Tube, Q4H PRN **OR** acetaminophen (TYLENOL) suppository 650 mg, 650 mg, Rectal, Q4H PRN, Jefferson Fuel, MD   aspirin 81 MG chewable tablet, , , ,    cangrelor (KENGREAL) 50 MG SOLR, , , ,    clopidogrel (PLAVIX) 300 MG tablet, , , ,    eptifibatide (INTEGRILIN) 20 MG/10ML injection, , , ,    pantoprazole (PROTONIX) injection 40 mg, 40 mg, Intravenous, QHS, Gwynevere Lizana, Malachi Carl, MD   senna-docusate (Senokot-S) tablet 1 tablet, 1 tablet, Oral, QHS PRN, Jefferson Fuel, MD   sodium chloride flush (NS) 0.9 % injection 3 mL, 3 mL, Intravenous, Once, Horton, Kristie M, DO   ticagrelor (BRILINTA) 90 MG tablet, , , ,    verapamil (ISOPTIN) 2.5 MG/ML injection, , , ,  No current outpatient medications on file.  Facility-Administered Medications Ordered in Other Encounters:  0.9 %  sodium chloride infusion, , Intravenous, Continuous PRN, Audie Pinto, CRNA, New Bag at 12/03/21 1910   fentaNYL citrate (PF) (SUBLIMAZE) injection, , Intravenous, Anesthesia Intra-op, Audie Pinto, CRNA, 100 mcg at 12/03/21 1913   lactated ringers infusion, , Intravenous, Continuous PRN, Audie Pinto, CRNA, New Bag at 12/03/21 1910   lidocaine 2% (20 mg/mL) 5 mL syringe, , Intravenous, Anesthesia Intra-op, Audie Pinto, CRNA, 100 mg at 12/03/21 1912   propofol (DIPRIVAN) 10 mg/mL bolus/IV push, , Intravenous, Anesthesia Intra-op, Audie Pinto, CRNA, 100 mg at 12/03/21 1914   rocuronium bromide 10 mg/mL (PF) syringe, , Intravenous, Anesthesia Intra-op,  Audie Pinto, CRNA, 100 mg at 12/03/21 1923   succinylcholine (ANECTINE) syringe, , Intravenous, Anesthesia Intra-op, Audie Pinto, CRNA, 120 mg at 12/03/21 1914  Vitals   Vitals:   12/03/21 1837 12/03/21 1855 12/03/21 1909 12/03/21 1929  BP: (!) 120/96 125/85 (!) 152/94   Pulse: (!) 54     Resp: 18     SpO2: 95%     Weight:    96.4 kg  Height:    6\' 1"  (1.854 m)     Body mass index is 28.04 kg/m.  Physical Exam   Physical Exam Gen: drowsy, oriented to name, age, month Resp: increased WOB on NRB CV: irregularly irregular  Neuro: *MS: drowsy, oriented to name, age, month. Follows simple commands *Speech: severe dysarthria, able to name and repeat *CN: PERRL, blinks to threat bilat, R gaze deviation, L UMN facial droop, hearing intact to voice *Motor: 5/5 strength no drift RUE and RLE. 0/5 LUE. Some movement against gravity LLE. *Sensory: impaired to LT LUE and LLE. L hemineglect *Coordination: intact FNF on R *Reflexes: 1+ symmetric throughout *Gait: UTA  NIHSS  1a Level of Conscious.: 1 1b LOC Questions: 0 1c LOC Commands: 0 2 Best Gaze: 2 3 Visual: 0 4 Facial Palsy: 2 5a Motor Arm - left: 4 5b Motor Arm - Right: 0 6a Motor Leg - Left: 2 6b Motor Leg - Right: 0 7 Limb Ataxia: 0 8 Sensory: 1 9 Best Language: 0 10 Dysarthria: 2 11 Extinct. and Inatten.: 1  TOTAL: 15   Premorbid mRS = 0   Labs   CBC:  Recent Labs  Lab 12/03/21 1835 12/03/21 1837  WBC 10.3  --   NEUTROABS 8.2*  --   HGB 14.4 14.3  HCT 42.6 42.0  MCV 88.0  --   PLT 199  --     Basic Metabolic Panel:  Lab Results  Component Value Date   NA 139 12/03/2021   K 4.4 12/03/2021   GLUCOSE 133 (H) 12/03/2021   BUN 21 12/03/2021   CREATININE 1.00 12/03/2021   Lipid Panel: No results found for: LDLCALC HgbA1c: No results found for: HGBA1C Urine Drug Screen: No results found for: LABOPIA, COCAINSCRNUR, LABBENZ, AMPHETMU, THCU, LABBARB  Alcohol Level No results  found for: ETH   Impression   This is a 71 yo man with hx a fib on daily aspirin 81 mg only, stable aortic aneurysm, who presented as a stroke code pre-activated by [facility] for left hemiplegia and right gaze deviation 2/2 evolving R MCA ischemic infarct 2/2 R M1 occlusion. S/p TNK. Currently in IR for mechanical thrombectomy  Recommendations   # Acute ischemic R MCA stroke 2/2 M1 occlusion, s/p TNK and mechanical thrombectomy - Admit to [facility] ICU under Dr. Selina Cooley - Routine post Thrombolytic monitoring including neuro checks and blood pressure  control during/after treatment Monitor blood pressure Check blood pressure and neuro assessment every 15 min for 2 h, then every 30 min for 6 h, and finally every hour for 16 h. - Manage Blood Pressure per post Thrombolytic protocol and IR BP parameters. Avoid oral antihypertensives - CT brain 24 hours post Thrombolytic - NPO until swallowing screen performed and passed - No antiplatelet agents or anticoagulants (including heparin for DVT prophylaxis) in first 24 hours - No Foley catheter, nasogastric tube, arterial catheter or central venous catheter for 24 hr, unless absolutely necessary - Telemetry - MRI brain wo - TTE w/ bubble - Check A1c and LDL + add statin per guidelines - q4 hr neuro checks - STAT head CT for any change in neuro exam - PT/OT - Stroke education - Amb referral to neurology upon discharge  # Atrial fibrillation, not previously on anticoagulation 2/2 low CHADS2VASC - Will need to consider therapeutic anticoagulation after the acute post-stroke period. Further guidance per stroke team   Stroke team will assume care tomorrow.  This patient is critically ill and at significant risk of neurological worsening, death and care requires constant monitoring of vital signs, hemodynamics,respiratory and cardiac monitoring, neurological assessment, discussion with family, other specialists and medical decision making of high  complexity. I spent 110 minutes of neurocritical care time  in the care of  this patient. This was time spent independent of any time provided by nurse practitioner or PA.  Bing Neighbors, MD [facility] [phone]  If 7pm- 7am, please page neurology on call as listed in AMION.

## 2021-12-03 NOTE — Anesthesia Preprocedure Evaluation (Addendum)
Anesthesia Evaluation  ?Patient identified by MRN, date of birth, ID band ? ?Reviewed: ?Allergy & Precautions, [name] Chart, lab work & pertinent test results, Unable to perform ROS - Chart review onlyPreop documentation limited or incomplete due to emergent nature of procedure. ? ?Airway ?Mallampati: II ? ?TM Distance: >3 FB ?Neck ROM: Full ? ? ? Dental ? ?(+) Missing ?  ?Pulmonary ?neg pulmonary ROS,  ?  ?Pulmonary exam normal ?breath sounds clear to auscultation ? ? ? ? ? ? Cardiovascular ?+ Peripheral Vascular Disease  ? ?Rhythm:Irregular Rate:Normal ? ?Ascending thoracic aortic aneurysm. Apparently stable on serial scans. 4.6 cm.  ?  ?Neuro/Psych ?CVA   ? GI/Hepatic ?negative GI ROS, Neg liver ROS,   ?Endo/Other  ?negative endocrine ROS ? Renal/GU ?negative Renal ROS  ? ?  ?Musculoskeletal ?negative musculoskeletal ROS ?(+)  ? Abdominal ?  ?Peds ? Hematology ?negative hematology ROS ?(+)   ?Anesthesia Other Findings ? ? Reproductive/Obstetrics ? ?  ? ? ? ? ? ? ? ? ? ? ? ? ? ?  ?  ? ? ? ? ? ? ? ?Anesthesia Physical ?Anesthesia Plan ? ?ASA: 3 and emergent ? ?Anesthesia Plan: General  ? ?Post-op Pain Management: Minimal or no pain anticipated  ? ?Induction: Intravenous, Cricoid pressure planned and Rapid sequence ? ?PONV Risk Score and Plan: 2 and Treatment may vary due to age or medical condition, Ondansetron and Dexamethasone ? ?Airway Management Planned: Oral ETT ? ?Additional Equipment: Arterial line ? ?Intra-op Plan:  ? ?Post-operative Plan: Possible Post-op intubation/ventilation ? ?Informed Consent: I have reviewed the patients History and Physical, chart, labs and discussed the procedure including the risks, benefits and alternatives for the proposed anesthesia with the patient or authorized representative who has indicated his/her understanding and acceptance.  ? ? ? ?Dental advisory given ? ?Plan Discussed with: CRNA ? ?Anesthesia Plan Comments:    ? ? ? ? ? ?Anesthesia Quick Evaluation ? ?

## 2021-12-03 NOTE — Progress Notes (Addendum)
PHARMACIST CODE STROKE RESPONSE ? ?Notified to mix TNK at 6:53 by Dr. Selina Cooley ?Delivered TNK to RN at 6:54 ? ?TNK dose = 24 mg IV over 5 seconds ? ?Issues/delays encountered (if applicable):  ?Initially told to mix TNK at 1841 and TNK was ready at 1842. MD instructed to obtain CTA before giving TNK for IR team. During the second call with the patient's wife, the wife noted he had an aortic aneurysm. Once MD received confirmation that the aneurysm was a stable abdominal aortic aneurysm, pharmacy was instructed to give TNK at 6:53. Nurse administered at 6:54. ? ?Marygrace Drought ?12/03/21 7:02 PM  ?

## 2021-12-03 NOTE — Anesthesia Procedure Notes (Signed)
Arterial Line Insertion ?Start/End3/30/2023 7:15 PM, 12/03/2021 7:25 PM ?Performed by: Lewie Loron, MD ? Patient location: Pre-op. ?Preanesthetic checklist: patient identified, IV checked, site marked, risks and benefits discussed, surgical consent, monitors and equipment checked, pre-op evaluation, timeout performed and anesthesia consent ?Lidocaine 1% used for infiltration ?Left, radial was placed ?Catheter size: 20 G ?Hand hygiene performed , maximum sterile barriers used  and Seldinger technique used ? ?Attempts: 1 ?Procedure performed without using ultrasound guided technique. ?Following insertion, dressing applied and Biopatch. ?Post procedure assessment: normal and unchanged ? ?Patient tolerated the procedure well with no immediate complications. ? ? ?

## 2021-12-03 NOTE — Procedures (Signed)
INR. ?Right common carotid arteriogram ?Right common femoral approach.Marland Kitchen ?Findings. ?1.  Occluded right middle cerebral artery M1 segment. ?2.  Complete revascularization of the right middle cerebral artery M1 occlusion with 1 pass with a 4 x 40 mm Selter a extra retrieval device and complex aspiration achieving a TICI 3 revascularization.   ?Post CT of the brain demonstrates small focus of hyperattenuation in the deep right posterior insular area.  No gross hemorrhage or mass effect noted. ?Patient left intubated awaiting COVID-19 results.  8 French Angio-Seal closure device used for hemostasis of the right groin puncture site.  Distal pulses dopplerable in both the unchanged.. ? ?Fatima Sanger MD ? ? ?

## 2021-12-03 NOTE — Progress Notes (Signed)
Patient ID: Patrick Rose, male   DOB: 06/05/1951, 71 y.o.   MRN: [id number] ?INR. ?71 year old right-handed gentleman.  Modified Rankin score of 0. ?Last seen well 5 PM. ? ?New onset of right gaze deviation slurred speech, and left hemiplegia. ?CT brain right MCA high-protein sign.  No ICH.  Aspects 10.. ? ?CT demonstrates a occluded right M1 segment. ?Endovascular treatment discussed with spouse Dr. Selina Cooley.  Procedure reasons radius alternatives reviewed.  Spouse expressed understanding and gave permission to proceed with the treatment. ? ?Fatima Sanger MD ? ? ?

## 2021-12-03 NOTE — ED Provider Notes (Signed)
?[facility] ?Provider Note ? ? ?CSN: [id number] ?Arrival date & time: 12/03/21  1828 ? ?  ? ?History ? ?No chief complaint on file. ? ? ?JAGEN OSBORNE is a 71 y.o. male with history of A-fib, aortic aneurysm presenting as a code stroke for left hemiplegia and right gaze deviation. Last known normal at 5 PM.  Wife reports he is " healthy as a horse" and takes no medications on a daily basis.  Patient takes aspirin on a daily basis.  He is not on any other anticoagulation for his A-fib. ? ?HPI ? ?  ? ?Home Medications ?Prior to Admission medications   ?Not on File  ?   ? ?Allergies    ?Patient has no allergy information on record.   ? ?Review of Systems   ?Review of Systems  ?Unable to perform ROS: Mental status change  ?Neurological:  Positive for weakness.  ? ?Physical Exam ?Updated Vital Signs ?There were no vitals taken for this visit. ?Physical Exam ?Vitals and nursing note reviewed.  ?Constitutional:   ?   General: He is in acute distress.  ?   Appearance: He is ill-appearing.  ?Eyes:  ?   Comments: Right-sided gaze deviation  ?Cardiovascular:  ?   Rate and Rhythm: Normal rate and regular rhythm.  ?Pulmonary:  ?   Effort: Pulmonary effort is normal. No respiratory distress.  ?   Breath sounds: Normal breath sounds.  ?Neurological:  ?   Mental Status: He is alert.  ?   Motor: Weakness present.  ?   Comments: Left hemiplegia  ? ? ?ED Results / Procedures / Treatments   ?Labs ?(all labs ordered are listed, but only abnormal results are displayed) ?Labs Reviewed  ?CBG MONITORING, ED - Abnormal; Notable for the following components:  ?    Result Value  ? Glucose-Capillary 131 (*)   ? All other components within normal limits  ?PROTIME-INR  ?APTT  ?CBC  ?DIFFERENTIAL  ?COMPREHENSIVE METABOLIC PANEL  ?I-STAT CHEM 8, ED  ? ? ?EKG ?None ? ?Radiology ?No results found. ? ?Procedures ?Procedures  ? ? ?Medications Ordered in ED ?Medications  ?sodium chloride flush (NS) 0.9 % injection  3 mL (has no administration in time range)  ? ? ?ED Course/ Medical Decision Making/ A&P ?Clinical Course as of 12/03/21 2329  ?Thu Dec 03, 2021  ?1858 70yoM w/ no known PMHx presenting with focal neuro deficit. CT head w/ hyperdensity Right MCA which is very suspicious for acute occlusion and thrombus on my review confirmed by radiology. Emergently to IR for thrombectomy.  [RK]  ?2031 Pt admitted to neurology ICU.  [RK]  ?  ?Clinical Course User Index ?[RK] Lupita Dawn, MD  ? ?                        ?Medical Decision Making ?Amount and/or Complexity of Data Reviewed ?Labs: ordered. ?Radiology: ordered. ? ?Risk ?Decision regarding hospitalization. ? ?[name] i-STAT overall reassuring.  CTA showed acute occlusion of right MCA likely secondary to embolus.  Patient taken emergently to IR for thrombectomy given acute threat to limb and life.  Patient seen in conjunction my attending Dr. Dina Rich. ? ?Final Clinical Impression(s) / ED Diagnoses ?Final diagnoses:  ?Acute ischemic right MCA stroke ([place])  ? ? ?Rx / DC Orders ?ED Discharge Orders   ? ? None  ? ?  ? ? ?  ?Lupita Dawn, MD ?12/03/21 2337 ? ?  ?Horton, Drue Dun  M, DO ?12/04/21 1836 ? ?

## 2021-12-03 NOTE — Anesthesia Procedure Notes (Signed)
Procedure Name: Intubation ?Date/Time: 12/03/2021 7:15 PM ?Performed by: Janene Harvey, CRNA ?Pre-anesthesia Checklist: Patient identified, Emergency Drugs available, Suction available and Patient being monitored ?Patient Re-evaluated:Patient Re-evaluated prior to induction ?Oxygen Delivery Method: Circle system utilized ?Preoxygenation: Pre-oxygenation with 100% oxygen ?Induction Type: IV induction, Rapid sequence and Cricoid Pressure applied ?Laryngoscope Size: Mac and 4 ?Grade View: Grade I ?Tube type: Oral ?Tube size: 7.5 mm ?Number of attempts: 1 ?Airway Equipment and Method: Stylet and Oral airway ?Placement Confirmation: ETT inserted through vocal cords under direct vision, positive ETCO2 and breath sounds checked- equal and bilateral ?Secured at: 23 cm ?Tube secured with: Tape ?Dental Injury: Teeth and Oropharynx as per pre-operative assessment  ? ? ? ? ?

## 2021-12-03 NOTE — Progress Notes (Signed)
Patient was transported from IR to 4N26 via the ventilator with no complications.  ?

## 2021-12-03 NOTE — Progress Notes (Signed)
Orthopedic Tech Progress Note ?Patient Details:  ?Patrick Rose ?1951-03-05 ?[id number] ? ?Ortho Devices ?Type of Ortho Device: Knee Immobilizer ?Ortho Device/Splint Location: RLE ?Ortho Device/Splint Interventions: Ordered, Application, Adjustment ?  ?  ? ?Darleen Crocker ?12/03/2021, 10:24 PM ? ?

## 2021-12-03 NOTE — Anesthesia Postprocedure Evaluation (Signed)
Anesthesia Post Note ? ?Patient: Patrick Rose ? ?Procedure(s) Performed: IR WITH ANESTHESIA ? ?  ? ?Patient location during evaluation: PACU ?Anesthesia Type: General ?Level of consciousness: sedated and patient remains intubated per anesthesia plan ?Pain management: pain level controlled ?Vital Signs Assessment: post-procedure vital signs reviewed and stable ?Respiratory status: patient remains intubated per anesthesia plan and patient on ventilator - see flowsheet for VS ?Cardiovascular status: stable ?Anesthetic complications: no ? ? ?No notable events documented. ? ?Last Vitals:  ?Vitals:  ? 12/03/21 2100 12/03/21 2109  ?BP: 109/68 (!) 168/77  ?Pulse: 65 66  ?Resp: 16   ?SpO2: 98%   ?  ?Last Pain:  ?Vitals:  ? 12/03/21 1923  ?PainSc: 0-No pain  ? ? ?  ?  ?  ?  ?  ?  ? ?Nolon Nations ? ? ? ? ?

## 2021-12-03 NOTE — ED Triage Notes (Signed)
Pt BIB [facility] from home. Wife called EMS due to patient falling in the bathroom and unable to get patient up. Upon EMS arrival, patient noted to have left sided facial droop, left sided weakness in arm and leg, along with left sided neglect. Last known well of 1700. Pt activated as a Code Stroke. Per EMS patient was initially hypoxic and requiring non rebreather at 15 L per minute. Per EMS HR ranging from 20s-120s. BP stable.  ?

## 2021-12-04 ENCOUNTER — Encounter (HOSPITAL_COMMUNITY): Payer: Self-pay | Admitting: Interventional Radiology

## 2021-12-04 ENCOUNTER — Inpatient Hospital Stay (HOSPITAL_COMMUNITY): Payer: Medicare HMO

## 2021-12-04 DIAGNOSIS — I6601 Occlusion and stenosis of right middle cerebral artery: Secondary | ICD-10-CM | POA: Diagnosis present

## 2021-12-04 DIAGNOSIS — I63511 Cerebral infarction due to unspecified occlusion or stenosis of right middle cerebral artery: Secondary | ICD-10-CM

## 2021-12-04 LAB — CBC WITH DIFFERENTIAL/PLATELET
Abs Immature Granulocytes: 0.03 10*3/uL (ref 0.00–0.07)
Basophils Absolute: 0 10*3/uL (ref 0.0–0.1)
Basophils Relative: 0 %
Eosinophils Absolute: 0 10*3/uL (ref 0.0–0.5)
Eosinophils Relative: 0 %
HCT: 39.4 % (ref 39.0–52.0)
Hemoglobin: 13.2 g/dL (ref 13.0–17.0)
Immature Granulocytes: 0 %
Lymphocytes Relative: 6 %
Lymphs Abs: 0.6 10*3/uL — ABNORMAL LOW (ref 0.7–4.0)
MCH: 29.4 pg (ref 26.0–34.0)
MCHC: 33.5 g/dL (ref 30.0–36.0)
MCV: 87.8 fL (ref 80.0–100.0)
Monocytes Absolute: 0.2 10*3/uL (ref 0.1–1.0)
Monocytes Relative: 2 %
Neutro Abs: 9.2 10*3/uL — ABNORMAL HIGH (ref 1.7–7.7)
Neutrophils Relative %: 92 %
Platelets: 203 10*3/uL (ref 150–400)
RBC: 4.49 MIL/uL (ref 4.22–5.81)
RDW: 12.5 % (ref 11.5–15.5)
WBC: 10 10*3/uL (ref 4.0–10.5)
nRBC: 0 % (ref 0.0–0.2)

## 2021-12-04 LAB — ECHOCARDIOGRAM COMPLETE
AR max vel: 4.63 cm2
AV Area VTI: 4.15 cm2
AV Area mean vel: 3.91 cm2
AV Mean grad: 2 mmHg
AV Peak grad: 4.3 mmHg
Ao pk vel: 1.04 m/s
Area-P 1/2: 2.99 cm2
Calc EF: 53.9 %
Height: 73 in
P 1/2 time: 993 msec
S' Lateral: 2.6 cm
Single Plane A2C EF: 57.9 %
Single Plane A4C EF: 48.4 %
Weight: 3400.38 oz

## 2021-12-04 LAB — LIPID PANEL
Cholesterol: 172 mg/dL (ref 0–200)
HDL: 48 mg/dL (ref >40)
LDL Cholesterol: 115 mg/dL — ABNORMAL HIGH (ref 0–99)
Total CHOL/HDL Ratio: 3.6 RATIO
Triglycerides: 45 mg/dL (ref <150)
VLDL: 9 mg/dL (ref 0–40)

## 2021-12-04 LAB — HEMOGLOBIN A1C
Hgb A1c MFr Bld: 5.4 % (ref 4.8–5.6)
Mean Plasma Glucose: 108.28 mg/dL

## 2021-12-04 LAB — TRIGLYCERIDES: Triglycerides: 43 mg/dL (ref <150)

## 2021-12-04 LAB — BASIC METABOLIC PANEL
Anion gap: 7 (ref 5–15)
BUN: 20 mg/dL (ref 8–23)
CO2: 22 mmol/L (ref 22–32)
Calcium: 8.9 mg/dL (ref 8.9–10.3)
Chloride: 105 mmol/L (ref 98–111)
Creatinine, Ser: 1.02 mg/dL (ref 0.61–1.24)
GFR, Estimated: >60 mL/min (ref >60)
Glucose, Bld: 164 mg/dL — ABNORMAL HIGH (ref 70–99)
Potassium: 4.5 mmol/L (ref 3.5–5.1)
Sodium: 134 mmol/L — ABNORMAL LOW (ref 135–145)

## 2021-12-04 MED ORDER — SODIUM CHLORIDE 0.9 % IV SOLN: INTRAVENOUS | Status: DC

## 2021-12-04 MED ORDER — ORAL CARE MOUTH RINSE
15.0000 mL | OROMUCOSAL | Status: DC
  Administered 2021-12-04: 15 mL via OROMUCOSAL

## 2021-12-04 MED ORDER — CHLORHEXIDINE GLUCONATE CLOTH 2 % EX PADS
6.0000 | MEDICATED_PAD | Freq: Every day | CUTANEOUS | Status: DC
  Administered 2021-12-04 – 2021-12-05 (×2): 6 via TOPICAL

## 2021-12-04 MED ORDER — CHLORHEXIDINE GLUCONATE 0.12% ORAL RINSE (MEDLINE KIT): 15.0000 mL | Freq: Two times a day (BID) | OROMUCOSAL | Status: DC

## 2021-12-04 MED ORDER — ACETAMINOPHEN 650 MG RE SUPP: 650.0000 mg | RECTAL | Status: DC | PRN

## 2021-12-04 MED ORDER — ACETAMINOPHEN 160 MG/5ML PO SOLN: 650.0000 mg | ORAL | Status: DC | PRN

## 2021-12-04 MED ORDER — HYDRALAZINE HCL 20 MG/ML IJ SOLN: 25.0000 mg | Freq: Three times a day (TID) | INTRAMUSCULAR | Status: DC | PRN

## 2021-12-04 MED ORDER — ACETAMINOPHEN 325 MG PO TABS: 650.0000 mg | ORAL_TABLET | ORAL | Status: DC | PRN

## 2021-12-04 MED ORDER — CLEVIDIPINE BUTYRATE 0.5 MG/ML IV EMUL: 0.0000 mg/h | INTRAVENOUS | Status: DC

## 2021-12-04 MED ORDER — ROSUVASTATIN CALCIUM 20 MG PO TABS: 20.0000 mg | ORAL_TABLET | Freq: Every day | ORAL | Status: DC

## 2021-12-04 MED ORDER — NOREPINEPHRINE 4 MG/250ML-% IV SOLN: 0.0000 ug/min | INTRAVENOUS | Status: DC

## 2021-12-04 MED ORDER — LABETALOL HCL 5 MG/ML IV SOLN
20.0000 mg | INTRAVENOUS | Status: DC | PRN
  Administered 2021-12-04: 20 mg via INTRAVENOUS
  Filled 2021-12-04: qty 4

## 2021-12-04 MED ORDER — SODIUM CHLORIDE 0.9 % IV SOLN: 250.0000 mL | INTRAVENOUS | Status: DC

## 2021-12-04 NOTE — Progress Notes (Addendum)
STROKE TEAM PROGRESS NOTE  ?INTERVAL HISTORY ?[name] is a 71 y.o. male with history of Afib no anticoagulation (chadsvasc 1) but daily asa 81, stable aortic aneurysm. ?"Presented as a stroke code pre-activated by [facility] for left hemiplegia and right gaze deviation.  Last known well was 5 PM today. Wife reports that he is extremely healthy at baseline, is an avid biker, and has no baseline deficits.  The only medication he takes on a daily basis is aspirin 81 g daily.  He was not on any anticoagulation for A-fib per chart review because his CHA2DS2-VASc score was 1.At 5 PM he developed acute left hemiplegia with right gaze deviation and slurred speech. NIHSS = 15, see below. Head CT showed no ICH but did show hyperdense R MCA. CTA showed right M1 occlusion   CTA head and neck negative for other intracranial or extracranial artery diseases.  2/2 lethargy which worsened over the course of the stroke code, patient was unable to consent for himself.  Dr Quinn Axe discussed the risks and benefits of TNK with wife by phone who confirmed he met inclusion criteria, did not meet exclusion criteria, and gave informed consent to administer TNK. Aortic aneurysm stable per wife on past several check ups. CTA H&N showed R M1 cutoff.  Benefits and risks of IR mechanical thrombectomy reviewed with wife including 10% risk of ICH and she gave informed consent to proceed with IR procedure. Case d/w Dr. Estanislado Pandy and code IR was activated. Patient is currently in IR and will be admitted to Neuro ICU after the procedure."  ? ?Overnight patient had right groin hematoma with SBPs 80s. Lipid panel and A1c resulted. Added statin. ECHO and MRI resulted. UDS pending ? ?Wife and daughter were at bedside. Patient is intubated, eyes closed, but awake and alert. Only motion is right UE and LE, some head nodding up and down. He is able to follow 2 point and midline crossing commands. Left hemiplegia with no sensation. Dysconjugate,  fixed gaze upward.  ? ?Patient was successfully extubated. Later in the afternoon  SLP recommended NPO, but ok for meds to be crushed with apple sauce.  ?MRI scan of the brain done this morning shows patchy right MCA cortical and subcortical infarct without any hemorrhagic transformation, midline shift or mass effect ?Vitals:  ? 12/04/21 1100 12/04/21 1102 12/04/21 1200 12/04/21 1300  ?BP: 126/66  (!) 144/62 (!) 158/78  ?Pulse: 79 71 86 (!) 112  ?Resp: _0 ?Temp:   98 ?F (36.7 ?C)   ?TempSrc:   Axillary   ?SpO2: 99% 98% 95% 92%  ?Weight:      ?Height:      ? ?CBC:  ?Recent Labs  ?Lab 12/03/21 ?1835 12/03/21 ?1837 12/04/21 ?0400  ?WBC 10.3  --  10.0  ?NEUTROABS 8.2*  --  9.2*  ?HGB 14.4 14.3 13.2  ?HCT 42.6 42.0 39.4  ?MCV 88.0  --  87.8  ?PLT 199  --  203  ? ?Basic Metabolic Panel:  ?Recent Labs  ?Lab 12/03/21 ?1835 12/03/21 ?1837 12/04/21 ?0650  ?NA 138 139 134*  ?K 4.4 4.4 4.5  ?CL 106 105 105  ?CO2 24  --  22  ?GLUCOSE 134* 133* 164*  ?BUN _1 ?CREATININE 1.02 1.00 1.02  ?CALCIUM 9.4  --  8.9  ? ?Lipid Panel:  ?Recent Labs  ?Lab 12/04/21 ?3754  ?CHOL 172  ?TRIG 45  43  ?HDL 48  ?CHOLHDL 3.6  ?VLDL  9  ?LDLCALC 115*  ? ?HgbA1c:  ?Recent Labs  ?Lab 12/04/21 ?0400  ?HGBA1C 5.4  ? ?Urine Drug Screen:  ?No results for input(s): LABOPIA, COCAINSCRNUR, LABBENZ, AMPHETMU, THCU, LABBARB in the last 168 hours. ?Alcohol Level No results for input(s): ETH in the last 168 hours. ? ?IMAGING past 24 hours ?MR BRAIN WO CONTRAST ? ?Result Date: 12/04/2021 ?CLINICAL DATA:  Provided history: Neuro deficit, acute, stroke suspected. Right M1 occlusion status post IR. EXAM: MRI HEAD WITHOUT CONTRAST TECHNIQUE: Multiplanar, multiecho pulse sequences of the brain and surrounding structures were obtained without intravenous contrast. COMPARISON:  CT angiogram head/neck and non-contrast head CT 12/03/2021. FINDINGS: Mild intermittent motion degradation. Brain: No age advanced or lobar predominant atrophy. Large acute  cortical/subcortical infarct within the right MCA territory, within the right frontal lobe, right parietal lobe, right temporal lobe, right insula/subinsular region, right basal ganglia, anterior limb of right internal capsule and right corona radiata. Associated mild petechial hemorrhage within the right basal ganglia. Mass effect with partial effacement of the right lateral ventricle and 1-2 mm leftward midline shift. Moderate-sized acute cortically-based infarct within the medial right frontoparietal lobes (right ACA vascular territory). Minimal multifocal T2 FLAIR hyperintense signal abnormality elsewhere within the cerebral white matter, nonspecific but compatible with chronic small vessel ischemic disease. 3.6 cm focus of T2 FLAIR hyperintense signal abnormality within the superior right cerebellar hemisphere, with an appearance most suggestive of a chronic infarct. Punctate chronic microhemorrhage within the left cerebellar hemisphere (series 14, image 17). No chronic intracranial blood products. No extra-axial fluid collection. Vascular: Maintained flow voids within the proximal large arterial vessels. Skull and upper cervical spine: No focal suspicious marrow lesion. Sinuses/Orbits: Visualized orbits show no acute finding. Minimal mucosal thickening within the bilateral ethmoid sinuses. Other: Trace fluid within the right mastoid air cells. IMPRESSION: Large acute cortical/subcortical right MCA territory. Associated mild petechial hemorrhage within the right basal ganglia. Mass effect with partial effacement of the right lateral ventricle and 1-2 mm leftward midline shift. Moderate-sized acute cortically-based infarct within the medial right frontoparietal lobes (right ACA vascular territory). Background minimal chronic small-vessel ischemic changes within the cerebral white matter. 3.6 cm chronic infarct within the superior right cerebellar hemisphere. Electronically Signed   By: Kellie Simmering D.O.   On:  12/04/2021 11:22  ? ?ECHOCARDIOGRAM COMPLETE ? ?Result Date: 12/04/2021 ?   ECHOCARDIOGRAM REPORT   Patient Name:   MALEKI HIPPE Hackel Date of Exam: 12/04/2021 Medical Rec #:  [id number]      Height:       73.0 in Accession #:    [id number]     Weight:       212.5 lb Date of Birth:  12-Apr-1951      BSA:          2.208 m? Patient Age:    19 years       BP:           107/74 mmHg Patient Gender: M              HR:           76 bpm. Exam Location:  Inpatient Procedure: 2D Echo, Cardiac Doppler and Color Doppler Indications:    I63.9 STROKE  History:        Patient has no prior history of Echocardiogram examinations. NO                 HISTORY.  Sonographer:    Beryle Beams Referring Phys: [place]  1. Left ventricular ejection fraction, by estimation, is 55 to 60%. The left ventricle has normal function. The left ventricle has no regional wall motion abnormalities. There is moderate concentric left ventricular hypertrophy. Left ventricular diastolic parameters are indeterminate.  2. Right ventricular systolic function is normal. The right ventricular size is normal.  3. The mitral valve is grossly normal. No evidence of mitral valve regurgitation. No evidence of mitral stenosis.  4. The aortic valve is tricuspid. There is mild calcification of the aortic valve. Aortic valve regurgitation is trivial. Aortic valve sclerosis is present, with no evidence of aortic valve stenosis.  5. Aortic dilatation noted. There is borderline dilatation of the ascending aorta, measuring 39 mm.  6. The inferior vena cava is normal in size with <50% respiratory variability, suggesting right atrial pressure of 8 mmHg. Comparison(s): No prior Echocardiogram. Conclusion(s)/Recommendation(s): No intracardiac source of embolism detected on this transthoracic study. Consider a transesophageal echocardiogram to exclude cardiac source of embolism if clinically indicated. Technically challenging images with limited  windows.  Cannot exclude minor abnormalitiees. FINDINGS  Left Ventricle: Left ventricular ejection fraction, by estimation, is 55 to 60%. The left ventricle has normal function. The left ventricle has no regional wall motion ab

## 2021-12-04 NOTE — Progress Notes (Signed)
SLP Cancellation Note ? ?Patient Details ?Name: Patrick Rose ?MRN: [id number] ?DOB: 10-29-1950 ? ? ?Cancelled eval:   SLP order received. Pt is currently still on the vent. Will follow up for evaluation post-extubation.  ?      ? ?[name], MA CCC/SLP ?Acute Rehabilitation Services ?Office number [phone] ?Pager [phone] ? ?[name] ?12/04/2021, 9:16 AM ?

## 2021-12-04 NOTE — Progress Notes (Signed)
At 2300 right groin check patient had palpable hematoma. Notified Deveshwar, instructed to hold pressure for 20 min. Hematoma no longer present after holding pressure.  ?

## 2021-12-04 NOTE — Progress Notes (Addendum)
eLink Physician-Brief Progress Note ?Patient Name: Patrick Rose ?DOB: 07-22-1951 ?MRN: [id number] ? ? ?Date of Service ? 12/04/2021  ?HPI/Events of Note ? Patient with soft blood pressures, SBP 80's, Bedside RN reported a groin hematoma earlier in the night .  ?eICU Interventions ? Will check a stat H & H to r/o hemorrhage, Peripheral Levo gtt ordered. Patient was unable to wean Propofol due to patient agitation.  ? ? ? ?  ? ?Kerry Kass Leilanee Righetti ?12/04/2021, 3:08 AM ?

## 2021-12-04 NOTE — Progress Notes (Signed)
? ?  Echocardiogram ?2D Echocardiogram has been performed. ? ?Patrick Rose ?12/04/2021, 10:03 AM ?

## 2021-12-04 NOTE — TOC CAGE-AID Note (Signed)
Transition of Care (TOC) - CAGE-AID Screening ? ? ?Patient Details  ?Name: Patrick Rose ?MRN: [id number] ?Date of Birth: 26-Mar-1951 ? ?Transition of Care (TOC) CM/SW Contact:    ?Richey Doolittle C Tarpley-Carter, LCSWA ?Phone Number: ?12/04/2021, 11:36 AM ? ? ?Clinical Narrative: ?Pt is unable to participate in Cage Aid. ?Pt on full support and currently out of room.  CSW will assess at a better time. ? ?Insurance underwriter, MSW, LCSW-A ?Pronouns:  She/Her/Hers ?[facility]Transitions of Care ?Clinical Social Worker ?Direct Number:  [phone] ?[name] ? ?CAGE-AID Screening: ?Substance Abuse Screening unable to be completed due to: : Patient unable to participate ? ?  ?  ?  ?  ?  ? ?Substance Abuse Education Offered: No ? ?  ? ? ? ? ? ? ?

## 2021-12-04 NOTE — Progress Notes (Signed)
PT Cancellation Note ? ?Patient Details ?Name: Patrick Rose ?MRN: [id number] ?DOB: 02/20/1951 ? ? ?Cancelled Treatment:    Reason Eval/Treat Not Completed: Patient not medically ready - hematoma formation x2 s/p revasc, remains intubated. Hold for now, will check back when medically appropriate.  ? ?Stacie Glaze, PT DPT ?Acute Rehabilitation Services ?Pager [phone]  ?Office [phone] ? ? ? ?[name] ?12/04/2021, 9:26 AM ?

## 2021-12-04 NOTE — Progress Notes (Signed)
Patient transported to MRI and back to 4N26 without complication. Patient placed on ventilator full support during MRI trip and placed back on CPAP/PSV 5/5 when back in room. Tolerating well at this time. ?

## 2021-12-04 NOTE — Progress Notes (Signed)
Patient re-evaluated: ? ?Tolerating SBT with vital capacity of 1.1 L. [place] following commands on the right, left with hemiparesis.  Extubated to [place]. No stridor or respiratory distress. Attempting to verbalize. SLP consulted. NPO for now. Family updated at bedside.  ? ?Additional CCT: 20 min  ?

## 2021-12-04 NOTE — Progress Notes (Signed)
Patient developed hematoma at 0400. Held pressure for 15 min. ?

## 2021-12-04 NOTE — Progress Notes (Signed)
? ?NAME:  Patrick Rose, MRN:  [id number], DOB:  03/28/1951, LOS: 1 ?ADMISSION DATE:  12/03/2021 CONSULTATION DATE:  12/03/2021 ?REFERRING MD:  Estanislado Pandy - NIR CHIEF COMPLAINT:  R MCA stroke  ? ?History of Present Illness:  ?71 year old man who presented to [facility] ED 3/30 as a Code Stroke due to L hemiplegia and R gaze deviation. LKW 1700 3/30. PMHx significant Afib (CHA2DS2-VASc 1, not on AC), aortic aneurysm (stable) admitted with acute stroke s/p TNK and mechanical thrombectomy of right MCA m1 with TICI 3.  ? ?Pertinent Medical History:  ?Afib (not on [facility]), stable aortic aneurysm ? ?Significant Hospital Events: ?Including procedures, antibiotic start and stop dates in addition to other pertinent events   ?3/30 - Presented to [facility] ED as Code Stroke; LKW 1700. CT Head with hyperdense R MCA. CTA Head/Neck with R MCA M1 occlusion. Underwent mechanical thrombectomy with TICI 3 revascularization. ? ?Interim History / Subjective:  ?New admit, see above ?Remains intubated but following commands with left sided weakness. Small amount of bleeding at femoral sites, to lay flat until 1100 per bedside RN. Family is being updated by Neurology team.   ? ?Objective:  ?Blood pressure 131/73, pulse 79, temperature 97.8 ?F (36.6 ?C), temperature source Oral, resp. rate 14, height 6\' 1"  (1.854 m), weight 96.4 kg, SpO2 98 %. ?   ?Vent Mode: CPAP;PSV ?FiO2 (%):  [40 %-60 %] 40 % ?Set Rate:  [16 bmp] 16 bmp ?Vt Set:  [id number] mL] 640 mL ?PEEP:  [5 cmH20] 5 cmH20 ?Pressure Support:  [10 cmH20] 10 cmH20 ?Plateau Pressure:  [14 cmH20-17 cmH20] 15 cmH20  ? ?Intake/Output Summary (Last 24 hours) at 12/04/2021 0852 ?Last data filed at 12/04/2021 0800 ?Gross per 24 hour  ?Intake 1539.95 ml  ?Output 400 ml  ?Net 1139.95 ml  ? ? ?Filed Weights  ? 12/03/21 1800 12/03/21 1929  ?Weight: 96.4 kg 96.4 kg  ? ?Physical Examination: ?General: Acutely ill-appearing elderly man in NAD. Intubated ?HEENT: [place]/AT, anicteric sclera, PERRL 46mm, moist mucous membranes. L  ear with small triangular laceration. ?Neuro: Sedated. Eyes open with stimulation, following commands on the right. Left sided weakness.  ?CV: RRR, no m/g/r. ?PULM: Lungs clear, symmetric expansion, synchronous with ventilator, minimal secretions.  ?GI: Soft, nontender, nondistended. Normoactive bowel sounds. ?Extremities: No lower ext edema. Palpable pulses ?Skin: Warm/dry, no rashes. ? ?Resolved Hospital Problem List:  ? ? ?Assessment & Plan:  ?Ischemic stroke 2/2 R MCA M1 occlusion ?- s/p TNK 3/30 ?- S/p NIR intervention (Dr. Estanislado Pandy), R MCA M1 recanalization (TICI 3) on 3/30 ?- Goal SBP 140-160, blood pressure controlled without intervention  ?- 24 hour post-TNK/intervention per Neuro/NIR ?- MRI Brain per Neuro ?- Secondary stroke work up per Neuro ?- No antiplt or a/c x 24 hour post TNK ?- Statin therapy ? ?Acute hypoxemic respiratory failure 2/2 procedure ?- PAD protocol for sedation: Propofol and Fentanyl for goal RASS 0 to -1 ?- Patient to lay flat until 1100 and going for MRI this morning.  SAT/SBT upon return from MRI. ? ?Atrial fibrillation ?CHA2DS2-VASc score 3 (1 prior to stroke, not on [facility].) ?- NSR on telemetry ?- Cardiac monitoring ?- Rate controlled  ? ?AAA, stable ?Stable at several recent checks ?- Continue monitoring ? ?Best Practice: (right click and "Reselect all SmartList Selections" daily)  ? ?Per Primary Team ? ?Labs:  ?CBC: ?Recent Labs  ?Lab 12/03/21 ?1835 12/03/21 ?1837 12/04/21 ?0400  ?WBC 10.3  --  10.0  ?NEUTROABS 8.2*  --  9.2*  ?HGB  14.4 14.3 13.2  ?HCT 42.6 42.0 39.4  ?MCV 88.0  --  87.8  ?PLT 199  --  203  ? ? ?Basic Metabolic Panel: ?Recent Labs  ?Lab 12/03/21 ?1835 12/03/21 ?1837 12/04/21 ?0650  ?NA 138 139 134*  ?K 4.4 4.4 4.5  ?CL 106 105 105  ?CO2 24  --  22  ?GLUCOSE 134* 133* 164*  ?BUN 19 21 20   ?CREATININE 1.02 1.00 1.02  ?CALCIUM 9.4  --  8.9  ? ? ?GFR: ?Estimated Creatinine Clearance: 81.3 mL/min (by C-G formula based on SCr of 1.02 mg/dL). ?Recent Labs  ?Lab  12/03/21 ?1835 12/04/21 ?0400  ?WBC 10.3 10.0  ? ? ?Liver Function Tests: ?Recent Labs  ?Lab 12/03/21 ?1835  ?AST 28  ?ALT 24  ?ALKPHOS 67  ?BILITOT 0.9  ?PROT 6.2*  ?ALBUMIN 4.0  ? ? ?No results for input(s): LIPASE, AMYLASE in the last 168 hours. ?No results for input(s): AMMONIA in the last 168 hours. ? ?ABG: ?   ?Component Value Date/Time  ? TCO2 27 12/03/2021 1837  ? ?  ?Coagulation Profile: ?Recent Labs  ?Lab 12/03/21 ?1835  ?INR 1.2  ? ? ?Cardiac Enzymes: ?No results for input(s): CKTOTAL, CKMB, CKMBINDEX, TROPONINI in the last 168 hours. ? ?HbA1C: ?Hgb A1c MFr Bld  ?Date/Time Value Ref Range Status  ?12/04/2021 04:00 AM 5.4 4.8 - 5.6 % Final  ?  Comment:  ?  (NOTE) ?Pre diabetes:          5.7%-6.4% ? ?Diabetes:              >6.4% ? ?Glycemic control for   <7.0% ?adults with diabetes ?  ? ? ?CBG: ?Recent Labs  ?Lab 12/03/21 ?1831  ?GLUCAP 131*  ? ? ?  ?Critical care time: 35 minutes  ? ?Minda Ditto, NP ?[facility] ?12/04/21 8:52 AM ? ?Please see [place] ? ?From 7A-7P if no response, please call [phone] ?After hours, please call ELink [phone]  ?

## 2021-12-04 NOTE — Progress Notes (Signed)
Patient developed hematoma at 0650. Pressure held for 15 min.  ?

## 2021-12-04 NOTE — Procedures (Signed)
Extubation Procedure Note ? ?Patient Details:   ?Name: Patrick Rose ?DOB: 1950-12-11 ?MRN: [id number] ?  ?Airway Documentation:  ?  ?Vent end date: (not recorded) Vent end time: (not recorded)  ? ?Evaluation ? O2 sats: stable throughout ?Complications: No apparent complications ?Patient did tolerate procedure well. ?Bilateral Breath Sounds: Clear ?  ?Yes, patient able to speak. Patient extubated per MD order with RN at bedside. Patient placed on 4L nasal cannula. Patient tolerated well. All vitals stable. ? ?[name] ?12/04/2021, 6:09 PM ? ?

## 2021-12-04 NOTE — Progress Notes (Signed)
?  Transition of Care (TOC) Screening Note ? ? ?Patient Details  ?Name: Patrick Rose ?Date of Birth: [birth year] ? ? ?Transition of Care [facility]) CM/SW Contact:    ?Glennon Mac, RN ?Phone Number: ?12/04/2021, 12:25 PM ? ? ? ?Transition of Care Department [facility]) has reviewed patient and no TOC needs have been identified at this time. We will continue to monitor patient advancement through interdisciplinary progression rounds. If new patient transition needs arise, please place a TOC consult. ? ?Quintella Baton, RN, BSN  ?Trauma/Neuro ICU Case Manager ?[phone] ? ?

## 2021-12-04 NOTE — Evaluation (Signed)
Clinical/Bedside Swallow Evaluation ?Patient Details  ?Name: Patrick Rose ?MRN: [id number] ?Date of Birth: 1951/08/24 ? ?Today's Date: 12/04/2021 ?Time: SLP Start Time (ACUTE ONLY): 1440 SLP Stop Time (ACUTE ONLY): 1500 ?SLP Time Calculation (min) (ACUTE ONLY): 20 min ? ?Past Medical History: History reviewed. No pertinent past medical history. ?Past Surgical History:  ?Past Surgical History:  ?Procedure Laterality Date  ? RADIOLOGY WITH ANESTHESIA N/A 12/03/2021  ? Procedure: IR WITH ANESTHESIA;  Surgeon: Luanne Bras, MD;  Location: [place];  Service: Radiology;  Laterality: N/A;  ? ?HPI:  ?71 year old man who presented to [facility] ED 3/30 as a Code Stroke due to L hemiplegia and R gaze deviation. LKW 1700 3/30. PMHx significant Afib (CHA2DS2-VASc 1, not on AC), aortic aneurysm (stable) admitted with acute stroke s/p TNK and mechanical thrombectomy of right MCA m1 with TICI 3.  ?  ?Assessment / Plan / Recommendation  ?Clinical Impression ? Pt was seen for bedside swallow evaluation which was limited d/t pt's decreased level of alterness and participation. A complete oral mech exam could not be completed d/t pt's inability to maintain alert state; may also be secondary to apraxia. Prior to PO trials, pt had intermittent snoring respirations and ptosis. Slightly opened his R eye momentarily to wave at clinician. SLP facilitated individual straw sips of thin liquid to the pt. Pt initially had difficulty generating adequate negative pressure in order to receive straw sips. Pt eventually able to independently receive individual sips of thin liquid with verbal cues to drink from the straw. Pt demonstrated delayed cough with wet vocal quality. He accepted a 1/2 tsp bolus of applesauce, but exhibited poor awareness and propulsion of bolus. Pt observed to engage in oral holding of applesauce bolus. SLP used dry spoon acceptance in order to initiate pharyngeal swallow sequence and pt continued to orally hold applesauce. SLP  incorporated tactile chin cue which pt then resolved oral holding. It is recommended that the pt's NPO status be maintained at this time. If oral medications are necessitated, pt may receive them crushed in applesauce. SLP educated family members on not providing any further PO trials to the pt; family verbalized understanding. SLP to follow next date for PO readiness or need for further instrumental testing. ? ?SLP Visit Diagnosis: Dysphagia, unspecified (R13.10) ?   ?Aspiration Risk ? Moderate aspiration risk  ?  ?Diet Recommendation NPO  ? ?Medication Administration: Crushed with puree ?Compensations: Minimize environmental distractions;Slow rate;Small sips/bites ?Postural Changes: Seated upright at 90 degrees  ?  ?Other  Recommendations Oral Care Recommendations: Oral care BID   ? ?Recommendations for follow up therapy are one component of a multi-disciplinary discharge planning process, led by the attending physician.  Recommendations may be updated based on patient status, additional functional criteria and insurance authorization. ? ?Follow up Recommendations Other (comment) (TBD)  ? ? ?  ?Assistance Recommended at Discharge Other (comment) (TBD)  ?Functional Status Assessment Patient has had a recent decline in their functional status and demonstrates the ability to make significant improvements in function in a reasonable and predictable amount of time.  ?Frequency and Duration min 2x/week  ?2 weeks ?  ?   ? ?Prognosis Prognosis for Safe Diet Advancement: Good ?Barriers to Reach Goals: Cognitive deficits  ? ?  ? ?Swallow Study   ?General HPI: 71 year old man who presented to [facility] ED 3/30 as a Code Stroke due to L hemiplegia and R gaze deviation. LKW 1700 3/30. PMHx significant Afib (CHA2DS2-VASc 1, not on AC), aortic aneurysm (stable) admitted with  acute stroke s/p TNK and mechanical thrombectomy of right MCA m1 with TICI 3. ?Type of Study: Bedside Swallow Evaluation ?Diet Prior to this Study: Regular;Thin  liquids ?Temperature Spikes Noted: No ?Respiratory Status: Room air ?History of Recent Intubation: Yes ?Length of Intubations (days): 1 days ?Date extubated: 12/04/21 ?Behavior/Cognition: Lethargic/Drowsy;Requires cueing ?Oral Cavity Assessment: Within Functional Limits ?Oral Care Completed by SLP: No ?Oral Cavity - Dentition: Adequate natural dentition ?Vision: Impaired for self-feeding ?Self-Feeding Abilities: Total assist ?Patient Positioning: Upright in bed ?Baseline Vocal Quality: Normal  ?  ?Oral/Motor/Sensory Function Overall Oral Motor/Sensory Function: Mild impairment ?Facial Symmetry: Abnormal symmetry right ?Lingual ROM: Within Functional Limits ?Lingual Symmetry: Abnormal symmetry right   ?Ice Chips Ice chips: Not tested   ?Thin Liquid Thin Liquid: Impaired ?Presentation: Straw ?Oral Phase Functional Implications: Oral holding ?Pharyngeal  Phase Impairments: Wet Vocal Quality;Throat Clearing - Immediate  ?  ?Nectar Thick Nectar Thick Liquid: Not tested   ?Honey Thick Honey Thick Liquid: Not tested   ?Puree Puree: Impaired ?Presentation: Spoon ?Oral Phase Impairments: Impaired mastication;Poor awareness of bolus ?Oral Phase Functional Implications: Oral holding   ?Solid ? ? ?  Solid: Not tested  ? ?  ? ?Vaughan Sine ?12/04/2021,3:13 PM ? ? ? ?

## 2021-12-04 NOTE — Progress Notes (Signed)
?LATE ENTRY: Patient assessed this morning around 0815.  ? ?Referring Physician(s): CODE STROKE ? ?Supervising Physician: Luanne Bras ? ?Patient Status:  [facility] - In-pt ? ?Chief Complaint: Occluded right middle cerebral artery s/p TNK and thrombectomy with Dr. Estanislado Pandy 12/03/21 achieving TICI 3 revascularization.  ? ?Subjective: Patient sedated/intubated. 31 male family members at the bedside.  ? ?Allergies: ?Amoxicillin-pot clavulanate and Penicillins ? ?Medications: ?Prior to Admission medications   ?Medication Sig Start Date End Date Taking? Authorizing Provider  ?Ascorbic Acid (VITAMIN C PO) Take 1 tablet by mouth at bedtime.   Yes [provider]  ?aspirin EC 81 MG tablet Take 81 mg by mouth at bedtime. Swallow whole.   Yes [provider]  ?MELATONIN PO Take 1 capsule by mouth at bedtime as needed (sleep).   Yes [provider]  ?Multiple Vitamin (MULTIVITAMIN) tablet Take 1 tablet by mouth at bedtime.   Yes [provider]  ?Turmeric (QC TUMERIC COMPLEX PO) Take 1 capsule by mouth at bedtime.   Yes [provider]  ?VITAMIN D PO Take 1 tablet by mouth at bedtime.   Yes [provider]  ? ? ? ?Vital Signs: ?BP (!) 158/78   Pulse (!) 112   Temp 98 ?F (36.7 ?C) (Axillary)   Resp 15   Ht 6\' 1"  (1.854 m)   Wt 212 lb 8.4 oz (96.4 kg)   SpO2 92%   BMI 28.04 kg/m?  ? ?Physical Exam ?Constitutional:   ?   Comments: Intubated/sedated.   ?Cardiovascular:  ?   Comments: Right groin vascular site stable during exam. Last dressing change at 6:50 this morning. Dressing has oozing/blood outlined in marker. Bruising noted around the site.  ?Neurological:  ?   Comments: Per RN the patient has moved both legs and the right arm. No left arm movement observed.   ? ? ?Imaging: ?MR BRAIN WO CONTRAST ? ?Result Date: 12/04/2021 ?CLINICAL DATA:  Provided history: Neuro deficit, acute, stroke suspected. Right M1 occlusion status post IR. EXAM: MRI HEAD WITHOUT CONTRAST  TECHNIQUE: Multiplanar, multiecho pulse sequences of the brain and surrounding structures were obtained without intravenous contrast. COMPARISON:  CT angiogram head/neck and non-contrast head CT 12/03/2021. FINDINGS: Mild intermittent motion degradation. Brain: No age advanced or lobar predominant atrophy. Large acute cortical/subcortical infarct within the right MCA territory, within the right frontal lobe, right parietal lobe, right temporal lobe, right insula/subinsular region, right basal ganglia, anterior limb of right internal capsule and right corona radiata. Associated mild petechial hemorrhage within the right basal ganglia. Mass effect with partial effacement of the right lateral ventricle and 1-2 mm leftward midline shift. Moderate-sized acute cortically-based infarct within the medial right frontoparietal lobes (right ACA vascular territory). Minimal multifocal T2 FLAIR hyperintense signal abnormality elsewhere within the cerebral white matter, nonspecific but compatible with chronic small vessel ischemic disease. 3.6 cm focus of T2 FLAIR hyperintense signal abnormality within the superior right cerebellar hemisphere, with an appearance most suggestive of a chronic infarct. Punctate chronic microhemorrhage within the left cerebellar hemisphere (series 14, image 17). No chronic intracranial blood products. No extra-axial fluid collection. Vascular: Maintained flow voids within the proximal large arterial vessels. Skull and upper cervical spine: No focal suspicious marrow lesion. Sinuses/Orbits: Visualized orbits show no acute finding. Minimal mucosal thickening within the bilateral ethmoid sinuses. Other: Trace fluid within the right mastoid air cells. IMPRESSION: Large acute cortical/subcortical right MCA territory. Associated mild petechial hemorrhage within the right basal ganglia. Mass effect with partial effacement of the right lateral  ventricle and 1-2 mm leftward midline shift. Moderate-sized acute  cortically-based infarct within the medial right frontoparietal lobes (right ACA vascular territory). Background minimal chronic small-vessel ischemic changes within the cerebral white matter. 3.6 cm chronic infarct within the superior right cerebellar hemisphere. Electronically Signed   By: Kellie Simmering D.O.   On: 12/04/2021 11:22  ? ?CT HEAD CODE STROKE WO CONTRAST ? ?Result Date: 12/03/2021 ?CLINICAL DATA:  Code stroke.  Acute neuro deficit EXAM: CT HEAD WITHOUT CONTRAST TECHNIQUE: Contiguous axial images were obtained from the base of the skull through the vertex without intravenous contrast. RADIATION DOSE REDUCTION: This exam was performed according to the departmental dose-optimization program which includes automated exposure control, adjustment of the mA and/or kV according to patient size and/or use of iterative reconstruction technique. COMPARISON:  CT head 12/17/2019 FINDINGS: Brain: Mild atrophy. Hypodensity right lateral cerebellum unchanged compatible with chronic infarct. Negative for acute infarct, hemorrhage, mass. Vascular: Hyperdensity right middle cerebral artery M1 and M2 segments suspicious for acute occlusion and thrombus. Skull: Negative Sinuses/Orbits: Paranasal sinuses clear.  Negative orbit Other: None ASPECTS ([place] Stroke Program Early CT Score) - Ganglionic level infarction (caudate, lentiform nuclei, internal capsule, insula, M1-M3 cortex): 7 - Supraganglionic infarction (M4-M6 cortex): 3 Total score (0-10 with 10 being normal): 10 IMPRESSION: 1. Negative for acute infarct or hemorrhage. There is hyperdensity right MCA which is very suspicious for acute occlusion and thrombus. Correlate with CTA findings. 2. ASPECTS is 10 3. These results were called by telephone at the time of interpretation on 12/03/2021 at 6:50 pm to provider Quinn Axe, who verbally acknowledged these results. Electronically Signed   By: Franchot Gallo M.D.   On: 12/03/2021 18:51  ? ?CT ANGIO HEAD NECK W WO CM (CODE  STROKE) ? ?Result Date: 12/03/2021 ?CLINICAL DATA:  Acute neuro deficit. Right MCA syndrome. Symptoms 1 hour. EXAM: CT ANGIOGRAPHY HEAD AND NECK TECHNIQUE: Multidetector CT imaging of the head and neck was performed using the standard protocol during bolus administration of intravenous contrast. Multiplanar CT image reconstructions and MIPs were obtained to evaluate the vascular anatomy. Carotid stenosis measurements (when applicable) are obtained utilizing NASCET criteria, using the distal internal carotid diameter as the denominator. RADIATION DOSE REDUCTION: This exam was performed according to the departmental dose-optimization program which includes automated exposure control, adjustment of the mA and/or kV according to patient size and/or use of iterative reconstruction technique. CONTRAST:  75 mL Omnipaque 300 IV COMPARISON:  CT head 12/03/2021 FINDINGS: CTA NECK FINDINGS Aortic arch: Standard branching. Imaged portion shows no evidence of aneurysm or dissection. No significant stenosis of the major arch vessel origins. Right carotid system: Right carotid widely patent. No stenosis or atherosclerotic disease. Negative for dissection Left carotid system: Left carotid widely patent. Negative for atherosclerotic disease or dissection. Vertebral arteries: Left vertebral artery dominant and widely patent without stenosis. Right vertebral artery is patent without stenosis. Skeleton: ACDF C6-7. No acute skeletal abnormality. Cervical facet degeneration on the left at multiple levels. Other neck: Negative Upper chest: Lung apices clear bilaterally. Review of the MIP images confirms the above findings CTA HEAD FINDINGS Anterior circulation: Abrupt occlusion right M1 segment appears acute. This corresponds to hyperdense MCA on CT. Poor collateral circulation is present in the right MCA territory. Internal carotid artery widely patent bilaterally through the cavernous segment. Dominant right anterior cerebral artery.  This is widely patent. Small left A1 segment. Left A2 segment appears to occlude and have diffuse atherosclerotic disease distally. This is likely chronic. Left middle cerebral  artery is patent without stenosis

## 2021-12-05 DIAGNOSIS — I63511 Cerebral infarction due to unspecified occlusion or stenosis of right middle cerebral artery: Secondary | ICD-10-CM | POA: Diagnosis not present

## 2021-12-05 LAB — RAPID URINE DRUG SCREEN, HOSP PERFORMED
Amphetamines: NOT DETECTED
Barbiturates: NOT DETECTED
Benzodiazepines: NOT DETECTED
Cocaine: NOT DETECTED
Opiates: NOT DETECTED
Tetrahydrocannabinol: NOT DETECTED

## 2021-12-05 LAB — BASIC METABOLIC PANEL
Anion gap: 6 (ref 5–15)
BUN: 22 mg/dL (ref 8–23)
CO2: 23 mmol/L (ref 22–32)
Calcium: 8.5 mg/dL — ABNORMAL LOW (ref 8.9–10.3)
Chloride: 110 mmol/L (ref 98–111)
Creatinine, Ser: 1.03 mg/dL (ref 0.61–1.24)
GFR, Estimated: >60 mL/min (ref >60)
Glucose, Bld: 136 mg/dL — ABNORMAL HIGH (ref 70–99)
Potassium: 4 mmol/L (ref 3.5–5.1)
Sodium: 139 mmol/L (ref 135–145)

## 2021-12-05 LAB — CBC
HCT: 35.2 % — ABNORMAL LOW (ref 39.0–52.0)
Hemoglobin: 11.9 g/dL — ABNORMAL LOW (ref 13.0–17.0)
MCH: 29.5 pg (ref 26.0–34.0)
MCHC: 33.8 g/dL (ref 30.0–36.0)
MCV: 87.3 fL (ref 80.0–100.0)
Platelets: 182 10*3/uL (ref 150–400)
RBC: 4.03 MIL/uL — ABNORMAL LOW (ref 4.22–5.81)
RDW: 12.9 % (ref 11.5–15.5)
WBC: 13.3 10*3/uL — ABNORMAL HIGH (ref 4.0–10.5)
nRBC: 0 % (ref 0.0–0.2)

## 2021-12-05 LAB — PHOSPHORUS: Phosphorus: 2.9 mg/dL (ref 2.5–4.6)

## 2021-12-05 LAB — URINALYSIS, ROUTINE W REFLEX MICROSCOPIC
Bilirubin Urine: NEGATIVE
Glucose, UA: NEGATIVE mg/dL
Hgb urine dipstick: NEGATIVE
Ketones, ur: NEGATIVE mg/dL
Leukocytes,Ua: NEGATIVE
Nitrite: NEGATIVE
Protein, ur: NEGATIVE mg/dL
Specific Gravity, Urine: 1.025 (ref 1.005–1.030)
pH: 5 (ref 5.0–8.0)

## 2021-12-05 LAB — MAGNESIUM: Magnesium: 2 mg/dL (ref 1.7–2.4)

## 2021-12-05 MED ORDER — ASPIRIN 325 MG PO TABS
325.0000 mg | ORAL_TABLET | Freq: Every day | ORAL | Status: DC
  Administered 2021-12-05 – 2021-12-08 (×4): 325 mg via ORAL
  Filled 2021-12-05 (×4): qty 1

## 2021-12-05 MED ORDER — FOOD THICKENER (SIMPLYTHICK)
10.0000 | ORAL | Status: DC | PRN
  Filled 2021-12-05: qty 10

## 2021-12-05 MED ORDER — ROSUVASTATIN CALCIUM 20 MG PO TABS
20.0000 mg | ORAL_TABLET | Freq: Every day | ORAL | Status: DC
  Administered 2021-12-05 – 2021-12-07 (×3): 20 mg via ORAL
  Filled 2021-12-05 (×3): qty 1

## 2021-12-05 MED ORDER — DOCUSATE SODIUM 50 MG/5ML PO LIQD
100.0000 mg | Freq: Two times a day (BID) | ORAL | Status: DC
Start: 2021-12-05 — End: 2021-12-08
  Administered 2021-12-05 – 2021-12-08 (×6): 100 mg via ORAL
  Filled 2021-12-05 (×6): qty 10

## 2021-12-05 MED ORDER — POLYETHYLENE GLYCOL 3350 17 G PO PACK
17.0000 g | PACK | Freq: Every day | ORAL | Status: DC
Start: 2021-12-06 — End: 2021-12-08
  Administered 2021-12-06 – 2021-12-08 (×3): 17 g via ORAL
  Filled 2021-12-05 (×3): qty 1

## 2021-12-05 NOTE — Progress Notes (Signed)
Speech Language Pathology Treatment: Dysphagia  ?Patient Details ?Name: Patrick Rose ?MRN: [id number] ?DOB: 1951/07/10 ?Today's Date: 12/05/2021 ?Time: [id number] ?SLP Time Calculation (min) (ACUTE ONLY): 16 min ? ?Assessment / Plan / Recommendation ?Clinical Impression ? F/u after yesterday's swallow assessment. Pt is more alert and interactive. His wife and sister in law were at the bedside.  Kept eyes closed for duration of session.  Provided oral care; pt assisted and self-suctioned.  Provided with ice chips and thin water; the latter elicited immediate and consistent cough response, concerning for aspiration.  Nectar thick liquids did not lead to coughing nor wet phonation. He demonstrated left oral spillage with poor awareness and mild left oral residue with consumption of applesauce.  Required overall moderate verbal/tactile cues to attend to left face during self-feeding/cleaning.  Recommend initiating a dysphagia 1 diet with nectar thick liquids for now.  Discussed impact of stroke on swallowing with pt/wife and goal for advancing diet safely.  He may benefit from [facility] over the next few days. SLP will follow. ?  ?HPI HPI: 71 year old man who presented to [facility] ED 3/30 as a Code Stroke due to L hemiplegia and R gaze deviation. LKW 1700 3/30. PMHx significant Afib (CHA2DS2-VASc 1, not on AC), aortic aneurysm (stable) admitted with acute stroke s/p TNK and mechanical thrombectomy of right MCA m1 with TICI 3. ?  ?   ?SLP Plan ? Continue with current plan of care ? ?  ?  ?Recommendations for follow up therapy are one component of a multi-disciplinary discharge planning process, led by the attending physician.  Recommendations may be updated based on patient status, additional functional criteria and insurance authorization. ?  ? ?Recommendations  ?Diet recommendations: Dysphagia 1 (puree);Nectar-thick liquid ?Liquids provided via: Cup;Straw ?Medication Administration: Crushed with puree ?Supervision: Staff to assist  with self feeding ?Compensations: Minimize environmental distractions;Slow rate;Small sips/bites  ?   ?    ?   ? ? ? ? Oral Care Recommendations: Oral care BID ?Follow Up Recommendations: Other (comment) (tbd) ?SLP Visit Diagnosis: Dysphagia, oropharyngeal phase (R13.12) ?Plan: Continue with current plan of care ? ? ? ? ?  ?  ?Patrick Rose L. Patrick Veracruz, MA CCC/SLP ?Acute Rehabilitation Services ?Office number [phone] ?Pager [phone] ? ? ?Patrick Rose Patrick Rose ? ?12/05/2021, 11:17 AM ?

## 2021-12-05 NOTE — Progress Notes (Signed)
Inpatient Rehab Admissions Coordinator Note:  ? ?Per PT/OT/ST patient was screened for CIR candidacy by Nikola Marone Luvenia Starch, CCC-SLP. At this time, pt appears to be a potential candidate for CIR. I will place an order for rehab consult for full assessment, per our protocol.  Please contact me any with questions.. ? ? ?Patrick Phoenix, MS, CCC-SLP ?Admissions Coordinator ?[phone] ?12/05/21 ?4:24 PM ? ?

## 2021-12-05 NOTE — Progress Notes (Addendum)
STROKE TEAM PROGRESS NOTE  ?INTERVAL HISTORY ?Patient was seen in his room with his wife at the bedside.  HE was extubated yesterday and is doing well.  He has failed his swallow study and remains NPO.  He has been hemodynamically stable and his neurological exam is stable.  WBC slightly elevated to 13.3, low-grade fevers noted overnight.  Chest x-ray shows bilateral atelectasis- will encourage incentive spirometer use. Will transfer him out of the ICU today. ? ?Vitals:  ? 12/05/21 0700 12/05/21 0800 12/05/21 0900 12/05/21 1000  ?BP: 138/68 (!) 130/58 133/88 132/68  ?Pulse: 97 91 95 93  ?Resp: (!) 21 18 17  (!) 21  ?Temp:  98.1 ?F (36.7 ?C)    ?TempSrc:  Oral    ?SpO2: 95% 95% 94% 96%  ?Weight:      ?Height:      ? ?CBC:  ?Recent Labs  ?Lab 12/03/21 ?1835 12/03/21 ?1837 12/04/21 ?0400 12/05/21 ?0415  ?WBC 10.3  --  10.0 13.3*  ?NEUTROABS 8.2*  --  9.2*  --   ?HGB 14.4   < > 13.2 11.9*  ?HCT 42.6   < > 39.4 35.2*  ?MCV 88.0  --  87.8 87.3  ?PLT 199  --  203 182  ? < > = values in this interval not displayed.  ? ? ?Basic Metabolic Panel:  ?Recent Labs  ?Lab 12/04/21 ?0650 12/05/21 ?0415  ?NA 134* 139  ?K 4.5 4.0  ?CL 105 110  ?CO2 22 23  ?GLUCOSE 164* 136*  ?BUN 20 22  ?CREATININE 1.02 1.03  ?CALCIUM 8.9 8.5*  ?MG  --  2.0  ?PHOS  --  2.9  ? ? ?Lipid Panel:  ?Recent Labs  ?Lab 12/04/21 ?[id number]  ?CHOL 172  ?TRIG 45  43  ?HDL 48  ?CHOLHDL 3.6  ?VLDL 9  ?[place] 115*  ? ? ?HgbA1c:  ?Recent Labs  ?Lab 12/04/21 ?0400  ?HGBA1C 5.4  ? ? ?Urine Drug Screen:  ?Recent Labs  ?Lab 12/04/21 ?0500  ?LABOPIA NONE DETECTED  ?COCAINSCRNUR NONE DETECTED  ?LABBENZ NONE DETECTED  ?AMPHETMU NONE DETECTED  ?THCU NONE DETECTED  ?LABBARB NONE DETECTED  ? ?Alcohol Level No results for input(s): ETH in the last 168 hours. ? ?IMAGING past 24 hours ?MR BRAIN WO CONTRAST ? ?Result Date: 12/04/2021 ?CLINICAL DATA:  Provided history: Neuro deficit, acute, stroke suspected. Right M1 occlusion status post IR. EXAM: MRI HEAD WITHOUT CONTRAST TECHNIQUE:  Multiplanar, multiecho pulse sequences of the brain and surrounding structures were obtained without intravenous contrast. COMPARISON:  CT angiogram head/neck and non-contrast head CT 12/03/2021. FINDINGS: Mild intermittent motion degradation. Brain: No age advanced or lobar predominant atrophy. Large acute cortical/subcortical infarct within the right MCA territory, within the right frontal lobe, right parietal lobe, right temporal lobe, right insula/subinsular region, right basal ganglia, anterior limb of right internal capsule and right corona radiata. Associated mild petechial hemorrhage within the right basal ganglia. Mass effect with partial effacement of the right lateral ventricle and 1-2 mm leftward midline shift. Moderate-sized acute cortically-based infarct within the medial right frontoparietal lobes (right ACA vascular territory). Minimal multifocal T2 FLAIR hyperintense signal abnormality elsewhere within the cerebral white matter, nonspecific but compatible with chronic small vessel ischemic disease. 3.6 cm focus of T2 FLAIR hyperintense signal abnormality within the superior right cerebellar hemisphere, with an appearance most suggestive of a chronic infarct. Punctate chronic microhemorrhage within the left cerebellar hemisphere (series 14, image 17). No chronic intracranial blood products. No extra-axial fluid collection. Vascular: Maintained flow voids  within the proximal large arterial vessels. Skull and upper cervical spine: No focal suspicious marrow lesion. Sinuses/Orbits: Visualized orbits show no acute finding. Minimal mucosal thickening within the bilateral ethmoid sinuses. Other: Trace fluid within the right mastoid air cells. IMPRESSION: Large acute cortical/subcortical right MCA territory. Associated mild petechial hemorrhage within the right basal ganglia. Mass effect with partial effacement of the right lateral ventricle and 1-2 mm leftward midline shift. Moderate-sized acute  cortically-based infarct within the medial right frontoparietal lobes (right ACA vascular territory). Background minimal chronic small-vessel ischemic changes within the cerebral white matter. 3.6 cm chronic infarct within the superior right cerebellar hemisphere. Electronically Signed   By: Kellie Simmering D.O.   On: 12/04/2021 11:22  ? ?DG CHEST PORT 1 VIEW ? ?Result Date: 12/04/2021 ?CLINICAL DATA:  Hypoxia EXAM: PORTABLE CHEST 1 VIEW COMPARISON:  10/21/2014 FINDINGS: Shallow inspiration. Heart size and pulmonary vascularity are normal. Atelectasis in the lung bases. No effusion or pneumothorax. Mediastinal contours appear intact. Postoperative changes in the cervical spine. Degenerative changes in the spine and shoulders. IMPRESSION: Shallow inspiration with atelectasis in the lung bases. Electronically Signed   By: Lucienne Capers M.D.   On: 12/04/2021 19:47   ? ?PHYSICAL EXAM ?Constitutional: Elderly white male  ?Respiratory: Regular, unlabored respirations ? ? ?NEURO:  ?Mental Status: AA&Ox1 ?Speech/Language: speech is without dysarthria or aphasia.  Naming and comprehension intact. ? ?Cranial Nerves:  ?II: PERRL. Left sided visual neglect present ?III, IV, VI: EOMI. Eyelids elevate symmetrically.  ?V: Sensation is intact to light touch and symmetrical to face.  ?VII: Left sided facial droop noted ?VIII: hearing intact to voice. ?IX, X:  Phonation is normal.  ?XII: tongue is midline without fasciculations. ?Motor: 5/5 strength to RUE and RLE, 2/5 to LLE, no movement of LUE ?Tone: is increased on the left side ?Sensation- Intact to light touch bilaterally.  ?Gait- deferred ? ? ?ASSESSMENT/PLAN ?Patrick Rose is a 71 y.o. male with history of Afib no anticoagulation (chadsvasc 1 prior cva) but daily asa 81, stable aortic aneurysm presenting with left hemiplegia and right gaze deviation. He was extubated 3/31 and is doing well. ? ?Stroke: Right cortical M1/MCA occlusion s/p tNK and mechanical thrombectomy,  TICI 3 - (12/03/2021). Etiology presumably cardio-embolic source in the setting of afib not on anticoagulation due to low CHA2DS2-VASc score of 1 ?Code Stroke CT head:  Hyperdensity right MCA  ?MRI: Right cortical M1/MCA occlusion, wedge shape, within the right frontal lobe, right parietal lobe, right temporal lobe, right insula/subinsular region, right basal ganglia, anterior limb of right internal capsule and right corona radiata. Minimal multifocal T2 FLAIR hyperintense signal abnormality elsewhere within the cerebral white matter. 3.6 cm focus of T2 FLAIR hyperintense signal abnormality within the superior right cerebellar hemisphere. Punctate chronic microhemorrhage within the left cerebellar hemisphere  ?CTA head: Right M1/MCA occlusion ?CTA neck: B/l carotids and vertebral arteries without significant stenosis or dissection.  ?2D Echo: EF 55-60%.  ?LDL 115  (12/04/2021) ?HgbA1c 5.4 (12/04/2021)  ?VTE prophylaxis - Scd's start: 12/03/21 1912, Sequential compression devices, below knee,  ?Diet: NPO - can have meds crushed in apple sauce ?aspirin 81 mg daily prior to admission, anticoagulation after 48 to 72 hours given large size of infarct  ?therapy recommendations: pending ?Disposition:   inpt rehab. ? ?Hypertension ?Home meds: N/A  ?Stable ?Permissive hypertension (OK if < 220/120) 1-2 days but gradually normalize in 5-7 days ?Long-term BP goal normotensive ? ?Hyperlipidemia ?Home meds:  N/A ?LDL 115  (12/04/2021), goal <  70  ?Added Crestor 20 mg daily  ?High intensity statin  ?Continue statin at discharge ? ?Other Stroke Risk Factors ?Afib no anticoagulation - chadsvasc 3  ?Advanced Age >/= 49  ? ?Other Active Problems ?Aortic aneurysm - stable ? ?Hospital day # 2 ? ? ?ATTENDING ATTESTATION: ? ?71 year old with large right MCA CVA right M1 occlusion status post TNK and IR procedure.  History of atrial fibrillation not on anticoagulation due to low CHA2DS2-VASc score. ? ?Dr. Reeves Forth evaluated pt independently,  reviewed imaging, chart, labs. Discussed and formulated plan with the APP. Please see APP note above for details.   He continues to have left hemiplegia.  He has trouble with eye opening and tends to keep in close.  H

## 2021-12-05 NOTE — Evaluation (Signed)
Physical Therapy Evaluation ?Patient Details ?Name: Patrick Rose ?MRN: [id number] ?DOB: [birth year] ?Today's Date: 12/05/2021 ? ?History of Present Illness ? Pt. is a 71 y.o. M presenting to [facility] on 3/30 with left hemiplegia, right gaze deviation, and slurred speech. Head CT shows hyperdense R MCA and 2/2 lethargy. He recieved TNK on 3/30.  PMH significant for a fib and stable aortic aneurysm. ?  ?Clinical Impression ? Pt presents with condition above and deficits mentioned below, see PT Problem List. PTA, he was IND without DME  and riding bicycles. He reports he had lumbar surgery on 1/23 and has been limited activity wise since surgery. He lives with his wife in a house with ~7 STE and a basement. Currently, pt demonstrates balance deficits, apraxia, L neglect, pushing syndrome to the L, and L-sided weakness and proprioception deficits. Pt with extension patterns noted in his L lower extremity with mobility. He also displays cognitive deficits, not appearing to have any awareness of his deficits. He required maxA-TA x2 for all functional mobility and seated balance this date and was unable to take any steps. Pt would greatly benefit from intensive therapy in the AIR setting to maximize his safety and independence with all functional mobility as he has had a significant functional decline and is very pleasant and motivated to participate. Will continue to follow acutely. ?   ? ?Recommendations for follow up therapy are one component of a multi-disciplinary discharge planning process, led by the attending physician.  Recommendations may be updated based on patient status, additional functional criteria and insurance authorization. ? ?Follow Up Recommendations Acute inpatient rehab (3hours/day) ? ?  ?Assistance Recommended at Discharge Frequent or constant Supervision/Assistance  ?Patient can return home with the following ? A lot of help with walking and/or transfers;Two people to help with walking and/or transfers;A  lot of help with bathing/dressing/bathroom;Two people to help with bathing/dressing/bathroom;Assistance with cooking/housework;Direct supervision/assist for medications management;Direct supervision/assist for financial management;Assistance with feeding;Assist for transportation;Help with stairs or ramp for entrance ? ?  ?Equipment Recommendations BSC/3in1;Wheelchair (measurements PT);Wheelchair cushion (measurements PT);Rolling walker (2 wheels);Hospital bed;Other (comment) (hoyer lift; pending progress)  ?Recommendations for Other Services ? Rehab consult  ?  ?Functional Status Assessment Patient has had a recent decline in their functional status and demonstrates the ability to make significant improvements in function in a reasonable and predictable amount of time.  ? ?  ?Precautions / Restrictions Precautions ?Precautions: Fall ?Precaution Comments: pushing to L; watch SpO2; L neglect ?Restrictions ?Weight Bearing Restrictions: No  ? ?  ? ?Mobility ? Bed Mobility ?Overal bed mobility: Needs Assistance ?Bed Mobility: Supine to Sit, Sit to Supine ?  ?  ?Supine to sit: Total assist, +2 for physical assistance, +2 for safety/equipment ?Sit to supine: Max assist, +2 for physical assistance, +2 for safety/equipment ?  ?General bed mobility comments: Pt required max cues to initiate moving LEs toward EOB.  He required assist for all aspects due to motor planning and attentional deficits ?  ? ?Transfers ?Overall transfer level: Needs assistance ?Equipment used: 2 person hand held assist ?Transfers: Sit to/from Stand ?Sit to Stand: Max assist, +2 physical assistance, +2 safety/equipment ?  ?  ?  ?  ?  ?General transfer comment: assist for all aspects.  Pt was able to achieve standing with facilitation at bil. hips for extension and Rt knee blocked to prevent buckling.  Pt unable to fully extend trunk ?  ? ?Ambulation/Gait ?  ?  ?  ?  ?  ?  ?  ?  General Gait Details: unable ? ?Stairs ?  ?  ?  ?  ?  ? ?Wheelchair  Mobility ?  ? ?Modified Rankin (Stroke Patients Only) ?Modified Rankin (Stroke Patients Only) ?Pre-Morbid Rankin Score: No symptoms ?Modified Rankin: Severe disability ? ?  ? ?Balance Overall balance assessment: Needs assistance ?Sitting-balance support: Feet supported, No upper extremity supported ?Sitting balance-Leahy Scale: Zero ?Sitting balance - Comments: Pt required max A - total A for EOB sitting with brief periods of mod A.  Pt with pushing syndrome.  Did not initiate balance reactions ?Postural control: Posterior lean, Left lateral lean ?Standing balance support: Single extremity supported ?Standing balance-Leahy Scale: Zero ?Standing balance comment: max A +2 ?  ?  ?  ?  ?  ?  ?  ?  ?  ?  ?  ?   ? ? ? ?Pertinent Vitals/Pain Pain Assessment ?Pain Assessment: Faces ?Faces Pain Scale: Hurts little more ?Pain Location: R hamstring ?Pain Descriptors / Indicators: Sore, Other (Comment) (muscle spasm) ?Pain Intervention(s): Limited activity within patient's tolerance, Monitored during session, Repositioned  ? ? ?Home Living Family/patient expects to be discharged to:: Private residence ?Living Arrangements: Spouse/significant other ?Available Help at Discharge: Family ?Type of Home: House ?Home Access: Stairs to enter ?  ?Entrance Stairs-Number of Steps: ~7 ?  ?Home Layout: Laundry or work area in basement ?Home Equipment: None ?   ?  ?Prior Function Prior Level of Function : Independent/Modified Independent ?  ?  ?  ?  ?  ?  ?Mobility Comments: Pt ambulated without assistive device. He reports he had lumbar surgery in 1/23 and has been limited activity wise since surgery.  Typically he enjoys bicycling, and mowing grass on his tractor ?ADLs Comments: pt reports he was independent.  He is retired from [employer] jobs ?  ? ? ?Hand Dominance  ?   ? ?  ?Extremity/Trunk Assessment  ? Upper Extremity Assessment ?Upper Extremity Assessment: Defer to OT evaluation ?LUE Deficits / Details: PROM WFL. Flexor spasticity  noted ?LUE Coordination: decreased fine motor;decreased gross motor ?  ? ?Lower Extremity Assessment ?Lower Extremity Assessment: RLE deficits/detail;LLE deficits/detail ?RLE Deficits / Details: Pt with hamstring tightness noted ?LLE Deficits / Details: no spasticity noted when performing ashworth test on hamstrings, quads, and gastrocs; withdraws to noxious stimuli; extension patterns noted with functional mobility; poor motor planning and proprioception ?LLE Sensation: decreased proprioception ?LLE Coordination: decreased gross motor;decreased fine motor ?  ? ?Cervical / Trunk Assessment ?Cervical / Trunk Assessment: Back Surgery;Other exceptions (lumbar surgery 1/23) ?Cervical / Trunk Exceptions: capital extension of head/neck.  head/neck rotated to the Rt.  Pt with decreased dissassociation of upper trunk from lower trunk and decreased activation of lower trunk musculature.  poor trunk control  ?Communication  ? Communication: No difficulties  ?Cognition Arousal/Alertness: Awake/alert, Lethargic ?Behavior During Therapy: [facility] for tasks assessed/performed ?Overall Cognitive Status: Impaired/Different from baseline ?Area of Impairment: Orientation, Attention, Memory, Following commands, Safety/judgement, Awareness, Problem solving ?  ?  ?  ?  ?  ?  ?  ?  ?Orientation Level: Disoriented to, Place, Situation ?Current Attention Level: Divided, Sustained ?Memory: Decreased short-term memory ?Following Commands: Follows one step commands with increased time ?Safety/Judgement: Decreased awareness of deficits ?Awareness:  (pt with no awareness of deficits currently) ?Problem Solving: Slow processing, Difficulty sequencing, Requires verbal cues, Requires tactile cues ?General Comments: Pt reporting he was at "home" and that he was here because of his heart. Pt with L neglect and pushes self to his L with no  awareness of his deficits, despite repeated cues to correct. No reactional strategies noted. Poor motor planning  throughout, needing max multi-modal cues to direct him. ?  ?  ? ?  ?General Comments General comments (skin integrity, edema, etc.): Sp02 87-93% with pt on RA - RN notified ? ?  ?Exercises    ? ?Assessment/Plan  ?

## 2021-12-05 NOTE — Evaluation (Signed)
Occupational Therapy Evaluation Patient Details Name: Patrick Rose MRN: [id number] DOB: 09-04-1951 Today's Date: 12/05/2021   History of Present Illness Pt. is a 71 y.o. M presenting to [facility] on 3/30 with left hemiplegia, right gaze deviation, and slurred speech. Head CT shows hyperdense R MCA and 2/2 lethargy. He recieved TNK on 3/30.  PMH significant for a fib and stable aortic aneurysm.   Clinical Impression   Pt admitted with above. He demonstrates the below listed deficits and will benefit from continued OT to maximize safety and independence with BADLs.  Pt presents to OT with Lt hemiparesis with spasticity, impaired balance, Lt inattention, apraxia including eye opening apraxia, impaired balance, pusher syndrome, cognitive and visual deficits.  He currently requires mod - max A for UB ADLs and max A - total A for LB ADLs.  He requires max A +2 to stand and maintain standing balance briefly.  PTA, pt lived with wife and was fully independent without AD.  Recommend AIR to allow him to maximize safety and independence with ADLs and functional mobility.          Recommendations for follow up therapy are one component of a multi-disciplinary discharge planning process, led by the attending physician.  Recommendations may be updated based on patient status, additional functional criteria and insurance authorization.   Follow Up Recommendations  Acute inpatient rehab (3hours/day)    Assistance Recommended at Discharge Frequent or constant Supervision/Assistance  Patient can return home with the following A lot of help with walking and/or transfers;A lot of help with bathing/dressing/bathroom;Assistance with cooking/housework;Assistance with feeding;Direct supervision/assist for medications management;Direct supervision/assist for financial management;Assist for transportation;Help with stairs or ramp for entrance    Functional Status Assessment  Patient has had a recent decline in their  functional status and demonstrates the ability to make significant improvements in function in a reasonable and predictable amount of time.  Equipment Recommendations  BSC/3in1;Tub/shower seat;Wheelchair (measurements OT)    Recommendations for Other Services Rehab consult     Precautions / Restrictions Precautions Precautions: Fall Precaution Comments: pushing to L; watch SpO2; L neglect Restrictions Weight Bearing Restrictions: No      Mobility Bed Mobility Overal bed mobility: Needs Assistance Bed Mobility: Supine to Sit, Sit to Supine     Supine to sit: Total assist, +2 for physical assistance, +2 for safety/equipment Sit to supine: Max assist, +2 for physical assistance, +2 for safety/equipment   General bed mobility comments: Pt required max cues to initiate moving LEs toward EOB.  He required assist for all aspects due to motor planning and attentional deficits    Transfers Overall transfer level: Needs assistance Equipment used: 2 person hand held assist Transfers: Sit to/from Stand Sit to Stand: Max assist, +2 physical assistance, +2 safety/equipment           General transfer comment: assist for all aspects.  Pt was able to achieve standing with facilitation at bil. hips for extension and Rt knee blocked to prevent buckling.  Pt unable to fully extend trunk      Balance Overall balance assessment: Needs assistance Sitting-balance support: Feet supported Sitting balance-Leahy Scale: Zero Sitting balance - Comments: Pt required max A - total A for EOB sitting with brief periods of mod A.  Pt with pushing syndrome.  Did not initiate balance reactions Postural control: Posterior lean, Left lateral lean Standing balance support: Single extremity supported Standing balance-Leahy Scale: Zero Standing balance comment: max A +2  ADL either performed or assessed with clinical judgement   ADL Overall ADL's : Needs  assistance/impaired Eating/Feeding: Bed level;Maximal assistance Eating/Feeding Details (indicate cue type and reason): demonstrates appropriate use of utensils Grooming: Oral care;Minimal assistance;Bed level   Upper Body Bathing: Maximal assistance;Sitting   Lower Body Bathing: Maximal assistance;Sit to/from stand;Bed level   Upper Body Dressing : Maximal assistance;Sitting   Lower Body Dressing: Total assistance;Bed level;Sit to/from stand   Toilet Transfer: Total assistance Toilet Transfer Details (indicate cue type and reason): unable to safely attempt this date Toileting- Clothing Manipulation and Hygiene: Total assistance;Sit to/from stand;Bed level       Functional mobility during ADLs: Maximal assistance;+2 for safety/equipment       Vision Patient Visual Report: No change from baseline Additional Comments: Pt appears to have eye opening apraxia     Perception Perception Comments: will turn head/neck to Lt with cues, and is able to locate Lt UE when cued.  but does not spontaneously attend to Lt.  Pt demonstrates pusher syndrome   Praxis Praxis Praxis-Other Comments: appears to have eye opening apraxia    Pertinent Vitals/Pain Pain Assessment Pain Assessment: No/denies pain     Hand Dominance     Extremity/Trunk Assessment Upper Extremity Assessment Upper Extremity Assessment: Defer to OT evaluation LUE Deficits / Details: PROM WFL. Flexor spasticity noted LUE Coordination: decreased fine motor;decreased gross motor   Lower Extremity Assessment Lower Extremity Assessment: RLE deficits/detail;LLE deficits/detail RLE Deficits / Details: Pt with hamstring tightness noted LLE Deficits / Details: no spasticity noted when performing ashworth test on hamstrings, quads, and gastrocs; withdraws to noxious stimuli; extension patterns noted with functional mobility; poor motor planning and proprioception LLE Sensation: decreased proprioception LLE Coordination:  decreased gross motor;decreased fine motor   Cervical / Trunk Assessment Cervical / Trunk Assessment: Back Surgery;Other exceptions (lumbar surgery 1/23) Cervical / Trunk Exceptions: capital extension of head/neck.  head/neck rotated to the Rt.  Pt with decreased dissassociation of upper trunk from lower trunk and decreased activation of lower trunk musculature.  poor trunk control   Communication Communication Communication: No difficulties   Cognition Arousal/Alertness: Awake/alert, Lethargic Behavior During Therapy: WFL for tasks assessed/performed Overall Cognitive Status: Impaired/Different from baseline Area of Impairment: Orientation, Attention, Memory, Following commands, Safety/judgement, Awareness, Problem solving                 Orientation Level: Disoriented to, Place, Situation Current Attention Level: Divided, Sustained Memory: Decreased short-term memory Following Commands: Follows one step commands with increased time Safety/Judgement: Decreased awareness of deficits Awareness:  (pt with no awareness of deficits currently) Problem Solving: Slow processing, Difficulty sequencing, Requires verbal cues, Requires tactile cues       General Comments  Sp02 87-93% with pt on RA - RN notified    Exercises     Shoulder Instructions      Home Living Family/patient expects to be discharged to:: Private residence Living Arrangements: Spouse/significant other Available Help at Discharge: Family Type of Home: House Home Access: Stairs to enter Secretary/administrator of Steps: ~7   Home Layout: Laundry or work area in basement     [employer] Shower/Tub: Producer, television/film/video: Handicapped height     Home Equipment: None          Prior Functioning/Environment Prior Level of Function : Independent/Modified Independent             Mobility Comments: Pt ambulated without assistive device. He reports he had lumbar surgery in 1/23 and has  been  limited activity wise since surgery.  Typically he enjoys bicycling, and mowing grass on his tractor ADLs Comments: pt reports he was independent.  He is retired from [employer] jobs        OT Problem List: Decreased strength;Decreased range of motion;Decreased activity tolerance;Impaired balance (sitting and/or standing);Impaired vision/perception;Decreased coordination;Decreased cognition;Decreased knowledge of use of DME or AE;Decreased safety awareness;Impaired sensation;Impaired UE functional use      OT Treatment/Interventions: Self-care/ADL training;Neuromuscular education;DME and/or AE instruction;Therapeutic activities;Splinting;Cognitive remediation/compensation;Visual/perceptual remediation/compensation;Patient/family education;Balance training    OT Goals(Current goals can be found in the care plan section) Acute Rehab OT Goals Patient Stated Goal: family hopes for him to regain maximum function OT Goal Formulation: With patient/family Time For Goal Achievement: 12/19/21 Potential to Achieve Goals: Good ADL Goals Pt Will Perform Eating: with supervision;with set-up;sitting Pt Will Perform Grooming: with set-up;with supervision;sitting Pt Will Perform Upper Body Bathing: with mod assist;sitting Pt Will Perform Lower Body Bathing: with mod assist;sit to/from stand Pt Will Transfer to Toilet: with mod assist;stand pivot transfer;bedside commode Pt Will Perform Toileting - Clothing Manipulation and hygiene: with mod assist;sit to/from stand Pt/caregiver will Perform Home Exercise Program: Increased ROM;Left upper extremity;With written HEP provided Additional ADL Goal #1: Pt will locate ADL items on the Lt with no more than min cues during ADL tasks Additional ADL Goal #2: Pt will maintain eye opening at least 75% of time during activity  OT Frequency: Min 2X/week    Co-evaluation PT/OT/SLP Co-Evaluation/Treatment: Yes Reason for Co-Treatment: Necessary to address  cognition/behavior during functional activity;For patient/therapist safety;To address functional/ADL transfers PT goals addressed during session: Mobility/safety with mobility;Balance OT goals addressed during session: ADL's and self-care      AM-PAC OT "6 Clicks" Daily Activity     Outcome Measure Help from another person eating meals?: A Lot Help from another person taking care of personal grooming?: A Lot Help from another person toileting, which includes using toliet, bedpan, or urinal?: Total Help from another person bathing (including washing, rinsing, drying)?: A Lot Help from another person to put on and taking off regular upper body clothing?: A Lot Help from another person to put on and taking off regular lower body clothing?: Total 6 Click Score: 10   End of Session Equipment Utilized During Treatment: Oxygen Nurse Communication: Mobility status  Activity Tolerance: Patient tolerated treatment well Patient left: in bed;with call bell/phone within reach;with family/visitor present;with nursing/sitter in room  OT Visit Diagnosis: Unsteadiness on feet (R26.81);Apraxia (R48.2);Hemiplegia and hemiparesis;Muscle weakness (generalized) (M62.81) Hemiplegia - Right/Left: Left Hemiplegia - dominant/non-dominant: Non-Dominant Hemiplegia - caused by: Cerebral infarction                Time: 1610-9604 OT Time Calculation (min): 37 min Charges:  OT General Charges $OT Visit: 1 Visit OT Evaluation $OT Eval Moderate Complexity: 1 Mod  Eber Jones., OTR/L Acute Rehabilitation Services Pager [phone] Office [phone]   Jeani Hawking M 12/05/2021, 3:31 PM

## 2021-12-05 NOTE — Evaluation (Signed)
Speech Language Pathology Evaluation ?Patient Details ?Name: Patrick Rose ?MRN: [id number] ?DOB: 1950-09-20 ?Today's Date: 12/05/2021 ?Time: [id number] ?SLP Time Calculation (min) (ACUTE ONLY): 33 min ? ?Problem List:  ?Patient Active Problem List  ? Diagnosis Date Noted  ? Middle cerebral artery embolism, right 12/04/2021  ? Acute ischemic right MCA stroke ([place]) 12/03/2021  ? ?Past Medical History: History reviewed. No pertinent past medical history. ?Past Surgical History:  ?Past Surgical History:  ?Procedure Laterality Date  ? RADIOLOGY WITH ANESTHESIA N/A 12/03/2021  ? Procedure: IR WITH ANESTHESIA;  Surgeon: Luanne Bras, MD;  Location: [place];  Service: Radiology;  Laterality: N/A;  ? ?HPI:  ?71 year old man who presented to [facility] ED 3/30 as a Code Stroke due to L hemiplegia and R gaze deviation. LKW 1700 3/30. PMHx significant Afib (CHA2DS2-VASc 1, not on AC), aortic aneurysm (stable) admitted with acute stroke s/p TNK and mechanical thrombectomy of right MCA m1 with TICI 3.  ? ?Assessment / Plan / Recommendation ?Clinical Impression ? Pt presents with Right Hemisphere Communication Disorder characterized by left neglect, poor awareness, impaired pragmatics, and significant difficulty with appropriate initiation, sustaining/selecting, then ceasing attention. There was notable perseveratory output - recurrent, continuous, and stuck-in-set perseveration patterns were present.  Pt will benefit from SLP intervention to address the aforementioned deficits; from an SLP perspective, he is an excellent candidate for AIR. Will await OT/PT recommendations. ?   ?SLP Assessment ? SLP Recommendation/Assessment: Patient needs continued [place] [facility] ?SLP Visit Diagnosis: Cognitive communication deficit (R41.841)  ?  ?Recommendations for follow up therapy are one component of a multi-disciplinary discharge planning process, led by the attending physician.  Recommendations may be updated based on patient  status, additional functional criteria and insurance authorization. ?   ?Follow Up Recommendations ? Acute inpatient rehab (3hours/day)  ?  ?Assistance Recommended at Discharge ? Frequent or constant Supervision/Assistance  ?Functional Status Assessment Patient has had a recent decline in their functional status and demonstrates the ability to make significant improvements in function in a reasonable and predictable amount of time.  ?Frequency and Duration min 2x/week  ?2 weeks ?  ?   ?SLP Evaluation ?Cognition ? Overall Cognitive Status: Impaired/Different from baseline ?Arousal/Alertness: Awake/alert ?Orientation Level: Oriented to person;Oriented to time;Oriented to situation;Disoriented to place ?Attention: Sustained;Selective ?Sustained Attention: Impaired ?Sustained Attention Impairment: Verbal basic;Functional basic ?Selective Attention: Impaired ?Selective Attention Impairment: Verbal basic;Functional basic ?Memory: Impaired ?Memory Impairment: Storage deficit;Decreased recall of new information ?Awareness: Impaired ?Awareness Impairment: Intellectual impairment ?Behaviors: Perseveration ?Safety/Judgment: Impaired  ?  ?   ?Comprehension ? Auditory Comprehension ?Overall Auditory Comprehension: Appears within functional limits for tasks assessed ?Reading Comprehension ?Reading Status: Not tested  ?  ?Expression Expression ?Primary Mode of Expression: Verbal ?Verbal Expression ?Overall Verbal Expression: Appears within functional limits for tasks assessed   ?Oral / Motor ? Oral Motor/Sensory Function ?Overall Oral Motor/Sensory Function: Mild impairment ?Facial Symmetry: Abnormal symmetry right ?Facial Sensation: Reduced left;Suspected CN V (Trigeminal) dysfunction ?Lingual ROM: Within Functional Limits ?Lingual Symmetry: Abnormal symmetry right ?Motor Speech ?Overall Motor Speech: Impaired ?Respiration: Within functional limits ?Phonation: Normal ?Resonance: Within functional limits ?Articulation:  Impaired ?Level of Impairment: Sentence ?Intelligibility: Intelligibility reduced ?Word: 75-100% accurate ?Phrase: 75-100% accurate ?Sentence: 75-100% accurate ?Motor Planning: Witnin functional limits   ?        ? ?Patrick Rose ?12/05/2021, 11:40 AM ?Patrick Bamberg L. Jola Critzer, MA CCC/SLP ?Acute Rehabilitation Services ?Office number [phone] ?Pager [phone] ? ?

## 2021-12-06 ENCOUNTER — Encounter (HOSPITAL_COMMUNITY): Payer: Self-pay | Admitting: Neurology

## 2021-12-06 ENCOUNTER — Inpatient Hospital Stay (HOSPITAL_COMMUNITY): Payer: Medicare HMO

## 2021-12-06 DIAGNOSIS — I63511 Cerebral infarction due to unspecified occlusion or stenosis of right middle cerebral artery: Secondary | ICD-10-CM | POA: Diagnosis not present

## 2021-12-06 LAB — CBC
HCT: 34.4 % — ABNORMAL LOW (ref 39.0–52.0)
Hemoglobin: 11.3 g/dL — ABNORMAL LOW (ref 13.0–17.0)
MCH: 29.3 pg (ref 26.0–34.0)
MCHC: 32.8 g/dL (ref 30.0–36.0)
MCV: 89.1 fL (ref 80.0–100.0)
Platelets: 165 10*3/uL (ref 150–400)
RBC: 3.86 MIL/uL — ABNORMAL LOW (ref 4.22–5.81)
RDW: 12.9 % (ref 11.5–15.5)
WBC: 9.3 10*3/uL (ref 4.0–10.5)
nRBC: 0 % (ref 0.0–0.2)

## 2021-12-06 LAB — BASIC METABOLIC PANEL
Anion gap: 6 (ref 5–15)
BUN: 15 mg/dL (ref 8–23)
CO2: 27 mmol/L (ref 22–32)
Calcium: 8.8 mg/dL — ABNORMAL LOW (ref 8.9–10.3)
Chloride: 108 mmol/L (ref 98–111)
Creatinine, Ser: 0.99 mg/dL (ref 0.61–1.24)
GFR, Estimated: >60 mL/min (ref >60)
Glucose, Bld: 131 mg/dL — ABNORMAL HIGH (ref 70–99)
Potassium: 4.1 mmol/L (ref 3.5–5.1)
Sodium: 141 mmol/L (ref 135–145)

## 2021-12-06 NOTE — Plan of Care (Signed)
Pt is alert oriented x 4, pt is on 2L nasal cannula, greater than 95%. Attempted to wean off but pts O2 level dropped to 90%.  pt keeps eyes closed and opens them at times. Pt denies pain. Pt stated he feels hot. Pt had bath temp in room adjusted. Pt is now cool and comfortable. Pt left side is spastic. Pt is able to take medications whole. Pt has condom cath in place. Pt has been very thirsty. Pt has been turned and repositioned. No distress noted.  ? ? ? ? ?Problem: Safety: ?Goal: Non-violent Restraint(s) ?Outcome: Completed/Met ?  ?Problem: Education: ?Goal: Knowledge of General Education information will improve ?Description: Including pain rating scale, medication(s)/side effects and non-pharmacologic comfort measures ?Outcome: Progressing ?  ?Problem: Health Behavior/Discharge Planning: ?Goal: Ability to manage health-related needs will improve ?Outcome: Progressing ?  ?Problem: Clinical Measurements: ?Goal: Ability to maintain clinical measurements within normal limits will improve ?Outcome: Progressing ?Goal: Will remain free from infection ?Outcome: Progressing ?Goal: Diagnostic test results will improve ?Outcome: Progressing ?Goal: Respiratory complications will improve ?Outcome: Progressing ?Goal: Cardiovascular complication will be avoided ?Outcome: Progressing ?  ?Problem: Activity: ?Goal: Risk for activity intolerance will decrease ?Outcome: Progressing ?  ?Problem: Nutrition: ?Goal: Adequate nutrition will be maintained ?Outcome: Progressing ?  ?Problem: Coping: ?Goal: Level of anxiety will decrease ?Outcome: Progressing ?  ?Problem: Elimination: ?Goal: Will not experience complications related to bowel motility ?Outcome: Progressing ?Goal: Will not experience complications related to urinary retention ?Outcome: Progressing ?  ?Problem: Pain Managment: ?Goal: General experience of comfort will improve ?Outcome: Progressing ?  ?Problem: Safety: ?Goal: Ability to remain free from injury will  improve ?Outcome: Progressing ?  ?Problem: Skin Integrity: ?Goal: Risk for impaired skin integrity will decrease ?Outcome: Progressing ?  ?Problem: Education: ?Goal: Knowledge of disease or condition will improve ?Outcome: Progressing ?  ?Problem: Coping: ?Goal: Will verbalize positive feelings about self ?Outcome: Progressing ?  ?Problem: Health Behavior/Discharge Planning: ?Goal: Ability to manage health-related needs will improve ?Outcome: Progressing ?  ?Problem: Self-Care: ?Goal: Ability to participate in self-care as condition permits will improve ?Outcome: Progressing ?  ?Problem: Nutrition: ?Goal: Risk of aspiration will decrease ?Outcome: Progressing ?Goal: Dietary intake will improve ?Outcome: Progressing ?  ?Problem: Ischemic Stroke/TIA Tissue Perfusion: ?Goal: Complications of ischemic stroke/TIA will be minimized ?Outcome: Progressing ?  ?

## 2021-12-06 NOTE — PMR Pre-admission (Signed)
PMR Admission Coordinator Pre-Admission Assessment ? ?Patient: Patrick Rose is an 71 y.o., male ?MRN: [id number] ?DOB: 1951-06-24 ?Height: 6' 1"  (185.4 cm) ?Weight: 96.4 kg ? ?Insurance Information ?HMO: yes    PPO:      PCP:      IPA:      80/20:      OTHER:  ?PRIMARY: [employer]      Policy#: [id number]      Subscriber: patient ?CM Name: Barbera Setters      Phone#: [phone]     Fax#: [phone] ?Pre-Cert#: [id number]   approved for 5 days with f/u with Marcene Brawn phone [phone] fax [phone]   Employer:  ?Benefits:  Phone #: [phone]     Name:  ?Eff. Date: 09/06/20-still active     Deduct: does not have one      Out of Pocket Max: $3,500 ($280 met)      Life Max: NA ?CIR: $250/day co-pay with max co-pay of $1,000/admission      SNF: 100% coverage days 1-20, $196/day co-pay for days 21-100 ?Outpatient: $20/visit co-pay     Co-Pay:  ?Home Health: 100% coverage      Co-Pay:  ?DME: 80% coverage     Co-Pay: 20% co-insurance ?Providers: in-network ?SECONDARY:       Policy#:      Phone#:  ? ?Financial Counselor:       Phone#:  ? ?The ?Data Collection Information Summary? for patients in Inpatient Rehabilitation Facilities with attached ?Privacy Act [place] Records? was provided and verbally reviewed with: Patient and Family ? ?Emergency Contact Information ?Contact Information   ? ? Name Relation Home Work Mobile  ? Kistner,Rebacca Spouse   [phone]  ? Hamilton,Angie Daughter   [phone]  ? ?  ? ? ?Current Medical History  ?Patient Admitting Diagnosis: R MCA CVA ? ?History of Present Illness:  71 year old male with medical hx significant for: A-fib, aortic aneurysm. Pt presented to [facility] ED on 12/03/21 as a code stroke. Pt had left hemiplegia and right gaze deviation. CT head revealed hyperdensity right MCA. CTA showed acute occlusion of right MCA likely secondary to embolus. Pt received TNK and taken emergently to IR. Pt underwent right common carotid arteriogram and mechanical  thrombectomy.  Etiology presumably cardio-embolic source in the setting of afib not on anticoagulation due to low [place] score. Pt had revascularization of right MCA. Pt developed groin hematoma. Follow up MRI brain showed patchy right MCA cortical and subcortical infarct without any hemorrhagic transformation, midline shift, or mass effect.  ? ?CTA neck with bilateral carotids and vertebral arteries without significant stenosis or dissection. 2 d echo with EF 55-60%.  Hgb A1c 5.4. ASA 81 mg pta. Plan anticoagulation after 48 to 72 hours given large size of infarct. Allow permissive HTN. Added Crestor for LDL 115.  ?  ?Complete NIHSS TOTAL: 14 ? ?[name] medical record from [facility] has been reviewed by the rehabilitation admission coordinator and physician. ? ?Past Medical History  ?Past Medical History:  ?Diagnosis Date  ? Afib ([place])   ? ?Has the patient had major surgery during 100 days prior to admission? Yes ? ?Family History   ?family history is not on file. ? ?Current Medications ? ?Current Facility-Administered Medications:  ?  0.9 %  sodium chloride infusion, , Intravenous, Continuous, Derek Jack, MD, Last Rate: 50 mL/hr at 12/06/21 2322, New Bag at 12/06/21 2322 ?  acetaminophen (TYLENOL) tablet 650 mg, 650 mg, Oral, Q4H PRN, 650 mg at  12/06/21 1112 **OR** acetaminophen (TYLENOL) 160 MG/5ML solution 650 mg, 650 mg, Per Tube, Q4H PRN **OR** acetaminophen (TYLENOL) suppository 650 mg, 650 mg, Rectal, Q4H PRN, Derek Jack, MD ?  apixaban (ELIQUIS) tablet 5 mg, 5 mg, Oral, BID, Shafer, Devon, NP ?  docusate (COLACE) 50 MG/5ML liquid 100 mg, 100 mg, Oral, BID, Garvin Fila, MD, 100 mg at 12/08/21 0830 ?  food thickener (SIMPLYTHICK (NECTAR/LEVEL 2/MILDLY THICK)) 10 packet, 10 packet, Oral, PRN, Garvin Fila, MD ?  hydrALAZINE (APRESOLINE) injection 25 mg, 25 mg, Intravenous, Q8H PRN, Garvin Fila, MD ?  labetalol (NORMODYNE) injection 20-40 mg, 20-40 mg, Intravenous, Q2H PRN,  Garvin Fila, MD, 20 mg at 12/04/21 1321 ?  pantoprazole sodium (PROTONIX) 40 mg/20 mL oral suspension 40 mg, 40 mg, Per Tube, QHS, Shafer, Devon, NP, 40 mg at 12/07/21 2251 ?  polyethylene glycol (MIRALAX / GLYCOLAX) packet 17 g, 17 g, Oral, Daily, Garvin Fila, MD, 17 g at 12/08/21 0830 ?  rosuvastatin (CRESTOR) tablet 20 mg, 20 mg, Oral, QHS, Garvin Fila, MD, 20 mg at 12/07/21 2251 ?  senna-docusate (Senokot-S) tablet 1 tablet, 1 tablet, Oral, QHS PRN, Derek Jack, MD ?  sodium chloride flush (NS) 0.9 % injection 3 mL, 3 mL, Intravenous, Once, Horton, Kristie M, DO ? ?Patients Current Diet:  ?Diet Order   ? ?       ?  DIET DYS 2 Room service appropriate? Yes with Assist; Fluid consistency: Nectar Thick  Diet effective now       ?  ? ?  ?  ? ?  ? ?Precautions / Restrictions ?Precautions ?Precautions: Fall ?Precaution Comments: pushing to L; L neglect ?Restrictions ?Weight Bearing Restrictions: No  ? ?Has the patient had 2 or more falls or a fall with injury in the past year? No ? ?Prior Activity Level ?Community (5-7x/wk): get out of house reguraly. Rides 4 to 5 times per week, bike. Very active ? ?Prior Functional Level ?Self Care: Did the patient need help bathing, dressing, using the toilet or eating? Independent ? ?Indoor Mobility: Did the patient need assistance with walking from room to room (with or without device)? Independent ? ?Stairs: Did the patient need assistance with internal or external stairs (with or without device)? Independent ? ?Functional Cognition: Did the patient need help planning regular tasks such as shopping or remembering to take medications? Independent ? ?Patient Information ?Are you of Hispanic, Latino/a,or Spanish origin?: A. No, not of Hispanic, Latino/a, or Spanish origin ?What is your race?: A. White ?Do you need or want an interpreter to communicate with a doctor or health care staff?: 0. No ? ?[name] Response To:  ?Health Literacy and Transportation ?Is the  patient able to respond to health literacy and transportation needs?: Yes ?Health Literacy - How often do you need to have someone help you when you read instructions, pamphlets, or other written material from your doctor or pharmacy?: Never ?In the past 12 months, has lack of transportation kept you from medical appointments or from getting medications?: No ?In the past 12 months, has lack of transportation kept you from meetings, work, or from getting things needed for daily living?: No ? ?Home Assistive Devices / Equipment ?Home Assistive Devices/Equipment: None ?Home Equipment: None ? ?Prior Device Use: Indicate devices/aids used by the patient prior to current illness, exacerbation or injury? None of the above ? ?Current Functional Level ?Cognition ? Arousal/Alertness: Awake/alert ?Overall Cognitive Status: Impaired/Different from baseline ?Current Attention Level:  Focused ?Orientation Level: Oriented X4 ?Following Commands: Follows one step commands consistently, Follows multi-step commands inconsistently ?Safety/Judgement: Decreased awareness of deficits, Decreased awareness of safety ?General Comments: Pt. plesant and talkative throughout session, easily distracted and requires constant cuing to maintain task. Significant L neglect and skewed sense of upright. Requires constant cues for sequencing. ?Attention: Sustained, Selective ?Sustained Attention: Impaired ?Sustained Attention Impairment: Verbal basic, Functional basic ?Selective Attention: Impaired ?Selective Attention Impairment: Verbal basic, Functional basic ?Memory: Impaired ?Memory Impairment: Storage deficit, Decreased recall of new information ?Awareness: Impaired ?Awareness Impairment: Intellectual impairment ?Behaviors: Perseveration ?Safety/Judgment: Impaired ?   ?Extremity Assessment ?(includes Sensation/Coordination) ? Upper Extremity Assessment: Defer to OT evaluation ?LUE Deficits / Details: PROM WFL. Flexor spasticity noted ?LUE  Coordination: decreased fine motor, decreased gross motor  ?Lower Extremity Assessment: RLE deficits/detail, LLE deficits/detail ?RLE Deficits / Details: Pt with hamstring tightness noted ?LLE Deficits / Details: no spastici

## 2021-12-06 NOTE — Progress Notes (Signed)
Inpatient Rehab Admissions: ? ?Inpatient Rehab Consult received.  I met with patient and wife at the bedside for rehabilitation assessment and to discuss goals and expectations of an inpatient rehab admission.  Both acknowledged understanding of CIR goals and expectations. Both interested in put pursuing CIR. Wife confirmed that she will be able to provide 24/7 support for pt after discharge. Will continue to follow. ? ?Signed: ?Gayland Curry, MS, CCC-SLP ?Admissions Coordinator ?[phone] ? ? ?

## 2021-12-06 NOTE — Progress Notes (Addendum)
STROKE TEAM PROGRESS NOTE  ?INTERVAL HISTORY ?Patient was seen in his room with his wife at the bedside.  He passed his swallow evaluation yesterday and was placed on a diet.  He has been hemodynamically stable, his neurological exam is stable and he has had no acute events overnight. ? ?Vitals:  ? 12/05/21 1520 12/05/21 2233 12/06/21 0725 12/06/21 1120  ?BP: (!) 144/66 117/76 124/61 (!) 163/95  ?Pulse: 82 83 (!) 56 85  ?Resp: 16 20 16 20   ?Temp: 99.3 ?F (37.4 ?C) 99.3 ?F (37.4 ?C) 98.2 ?F (36.8 ?C) 97.8 ?F (36.6 ?C)  ?TempSrc: Oral Oral Oral Oral  ?SpO2: 99% 95% 98% 94%  ?Weight:      ?Height:      ? ?CBC:  ?Recent Labs  ?Lab 12/03/21 ?1835 12/03/21 ?1837 12/04/21 ?0400 12/05/21 ?0415 12/06/21 ?0258  ?WBC 10.3  --  10.0 13.3* 9.3  ?NEUTROABS 8.2*  --  9.2*  --   --   ?HGB 14.4   < > 13.2 11.9* 11.3*  ?HCT 42.6   < > 39.4 35.2* 34.4*  ?MCV 88.0  --  87.8 87.3 89.1  ?PLT 199  --  203 182 165  ? < > = values in this interval not displayed.  ? ? ?Basic Metabolic Panel:  ?Recent Labs  ?Lab 12/05/21 ?0415 12/06/21 ?0258  ?NA 139 141  ?K 4.0 4.1  ?CL 110 108  ?CO2 23 27  ?GLUCOSE 136* 131*  ?BUN 22 15  ?CREATININE 1.03 0.99  ?CALCIUM 8.5* 8.8*  ?MG 2.0  --   ?PHOS 2.9  --   ? ? ?Lipid Panel:  ?Recent Labs  ?Lab 12/04/21 ?[id number]  ?CHOL 172  ?TRIG 45  43  ?HDL 48  ?CHOLHDL 3.6  ?VLDL 9  ?[place] 115*  ? ? ?HgbA1c:  ?Recent Labs  ?Lab 12/04/21 ?0400  ?HGBA1C 5.4  ? ? ?Urine Drug Screen:  ?Recent Labs  ?Lab 12/04/21 ?0500  ?LABOPIA NONE DETECTED  ?COCAINSCRNUR NONE DETECTED  ?LABBENZ NONE DETECTED  ?AMPHETMU NONE DETECTED  ?THCU NONE DETECTED  ?LABBARB NONE DETECTED  ? ? ?Alcohol Level No results for input(s): ETH in the last 168 hours. ? ?IMAGING past 24 hours ?No results found. ? ?PHYSICAL EXAM ?Constitutional: Elderly white male  ?Respiratory: Regular, unlabored respirations ? ? ?NEURO:  ?Mental Status: AA&Ox1 ?Speech/Language: speech is without dysarthria or aphasia.  Naming and comprehension intact. ? ?Cranial Nerves:   ?II: PERRL.  ?III, IV, VI: EOMI. Eyelids elevate symmetrically.  ?V: Sensation is intact to light touch and symmetrical to face.  ?VII: Left sided facial droop noted ?VIII: hearing intact to voice. ?IX, X:  Phonation is normal.  ?XII: tongue is midline without fasciculations. ?Motor: 5/5 strength to RUE and RLE, 2/5 to LLE, no movement of LUE ?Tone: is increased on the left side ?Sensation- Intact to light touch bilaterally.  ?Gait- deferred ? ? ?ASSESSMENT/PLAN ?[name] is a 71 y.o. male with history of Afib no anticoagulation (chadsvasc 1 prior cva) but daily asa 81, stable aortic aneurysm presenting with left hemiplegia and right gaze deviation. He was extubated 3/31 and is doing well. ? ?Stroke: Right cortical M1/MCA occlusion s/p tNK and mechanical thrombectomy, TICI 3 - (12/03/2021). Etiology presumably cardio-embolic source in the setting of afib not on anticoagulation due to low CHA2DS2-VASc score of 1 ?Code Stroke CT head:  Hyperdensity right MCA  ?MRI: Right cortical M1/MCA occlusion, wedge shape, within the right frontal lobe, right parietal lobe, right temporal lobe, right  insula/subinsular region, right basal ganglia, anterior limb of right internal capsule and right corona radiata. Minimal multifocal T2 FLAIR hyperintense signal abnormality elsewhere within the cerebral white matter. 3.6 cm focus of T2 FLAIR hyperintense signal abnormality within the superior right cerebellar hemisphere. Punctate chronic microhemorrhage within the left cerebellar hemisphere  ?CTA head: Right M1/MCA occlusion ?CTA neck: B/l carotids and vertebral arteries without significant stenosis or dissection.  ?2D Echo: EF 55-60%.  ?LDL 115  (12/04/2021) ?HgbA1c 5.4 (12/04/2021)  ?VTE prophylaxis - Scd's start: 12/03/21 1912, Sequential compression devices, below knee,  ?Diet: NPO - can have meds crushed in apple sauce ?aspirin 81 mg daily prior to admission, anticoagulation after 48 to 72 hours given large size of  infarct  ?therapy recommendations: pending ?Disposition: Acute inpatient rehab (3hours/day) inpt rehab. ? ?Hypertension ?Home meds: N/A  ?Stable ?Permissive hypertension (OK if < 220/120) 1-2 days but gradually normalize in 5-7 days ?Long-term BP goal normotensive ? ?Hyperlipidemia ?Home meds:  N/A ?LDL 115  (12/04/2021), goal < 70  ?Added Crestor 20 mg daily  ?High intensity statin  ?Continue statin at discharge ? ?Other Stroke Risk Factors ?Afib no anticoagulation - chadsvasc 3  ?Advanced Age >/= 74  ? ?Other Active Problems ?Aortic aneurysm - stable ? ?Hospital day # 3 ? ?ATTENDING ATTESTATION: ?  ?71 year old with large right MCA CVA right M1 occlusion status post TNK and IR procedure.  History of atrial fibrillation not on anticoagulation due to low CHA2DS2-VASc score.He continues to have left hemiplegia.  eye opening apraxia.  He has right gaze preference but will cross midline easily.  He can follow commands and is oriented.  Updated his wife at the bedside.  . repeat CT if stable then start Louann today. ?  ?Dr. Reeves Forth evaluated pt independently, reviewed imaging, chart, labs. Discussed and formulated plan with the APP. Please see APP note above for details. I spent 35 minutes of neurocritical care time in the care of this patient.  ?  ?Patrick Pronovost,MD   ?To contact Stroke Continuity provider, please refer to [email or web address] ?After hours, contact General Neurology   ?

## 2021-12-07 DIAGNOSIS — I63511 Cerebral infarction due to unspecified occlusion or stenosis of right middle cerebral artery: Secondary | ICD-10-CM | POA: Diagnosis not present

## 2021-12-07 LAB — CBC
HCT: 36 % — ABNORMAL LOW (ref 39.0–52.0)
Hemoglobin: 12.2 g/dL — ABNORMAL LOW (ref 13.0–17.0)
MCH: 29.5 pg (ref 26.0–34.0)
MCHC: 33.9 g/dL (ref 30.0–36.0)
MCV: 87.2 fL (ref 80.0–100.0)
Platelets: 179 10*3/uL (ref 150–400)
RBC: 4.13 MIL/uL — ABNORMAL LOW (ref 4.22–5.81)
RDW: 12.4 % (ref 11.5–15.5)
WBC: 9.9 10*3/uL (ref 4.0–10.5)
nRBC: 0 % (ref 0.0–0.2)

## 2021-12-07 LAB — BASIC METABOLIC PANEL
Anion gap: 8 (ref 5–15)
BUN: 16 mg/dL (ref 8–23)
CO2: 23 mmol/L (ref 22–32)
Calcium: 8.9 mg/dL (ref 8.9–10.3)
Chloride: 108 mmol/L (ref 98–111)
Creatinine, Ser: 0.77 mg/dL (ref 0.61–1.24)
GFR, Estimated: >60 mL/min (ref >60)
Glucose, Bld: 114 mg/dL — ABNORMAL HIGH (ref 70–99)
Potassium: 4 mmol/L (ref 3.5–5.1)
Sodium: 139 mmol/L (ref 135–145)

## 2021-12-07 MED ORDER — PANTOPRAZOLE 2 MG/ML SUSPENSION
40.0000 mg | Freq: Every day | ORAL | Status: DC
Start: 2021-12-07 — End: 2021-12-08
  Administered 2021-12-07: 40 mg
  Filled 2021-12-07: qty 20

## 2021-12-07 NOTE — Progress Notes (Signed)
Speech Language Pathology Treatment: Dysphagia;Cognitive-Linquistic  ?Patient Details ?Name: WIKTOR GREENAWALT ?MRN: [id number] ?DOB: 09/30/1950 ?Today's Date: 12/07/2021 ?Time: [id number] ?SLP Time Calculation (min) (ACUTE ONLY): 33 min ? ?Assessment / Plan / Recommendation ?Clinical Impression ? Mr. Puleio participated in cognitive and swallowing tx this am.  He demonstrated improved spontaneous eye opening.  He required mod verbal cues for intellectual awareness; ongoing left neglect; poor persistence through tasks with frequent cues needed to continue self-feeding or respond to questions.  Affect flat.  Swallowing was marked by continued coughing after drinking trials of thin liquids, concerning for possible aspiration. He demonstrated improved effort with solids, masticating effectively with intermittent verbal and tactile prompts needed to check for left pocketing or spillage.  He was able to draw a clock with independent placement of 12/3/6/9 markers in correct location and difficulty with positioning of surrounding numbers. Demonstrated recall of events during session but some anosagnosia with regard to overall deficits.  SLP will continue to follow.  ?  ?HPI HPI: 71 year old man who presented to [facility] ED 3/30 as a Code Stroke due to L hemiplegia and R gaze deviation. LKW 1700 3/30. PMHx significant Afib (CHA2DS2-VASc 1, not on AC), aortic aneurysm (stable) admitted with acute stroke s/p TNK and mechanical thrombectomy of right MCA m1 with TICI 3. ?  ?   ?SLP Plan ? Continue with current plan of care ? ?  ?  ?Recommendations for follow up therapy are one component of a multi-disciplinary discharge planning process, led by the attending physician.  Recommendations may be updated based on patient status, additional functional criteria and insurance authorization. ?  ? ?Recommendations  ?Diet recommendations: Dysphagia 2 (fine chop);Nectar-thick liquid ?Liquids provided via: Cup;Straw ?Medication Administration: Crushed  with puree ?Supervision: Staff to assist with self feeding ?Compensations: Minimize environmental distractions;Slow rate;Small sips/bites  ?   ?    ?   ? ? ? ? Oral Care Recommendations: Oral care BID ?Follow Up Recommendations: Acute inpatient rehab (3hours/day) ?Assistance recommended at discharge: Frequent or constant Supervision/Assistance ?SLP Visit Diagnosis: Cognitive communication deficit (R41.841) ?Plan: Continue with current plan of care ? ? ? ? ?  ?  ? ?Callen Zuba L. Kaylon Laroche, MA CCC/SLP ?Acute Rehabilitation Services ?Office number [phone] ?Pager [phone] ? ?[name] ? ?12/07/2021, 12:08 PM ?

## 2021-12-07 NOTE — Progress Notes (Signed)
Inpatient Rehab Admissions Coordinator:  Began insurance authorization. Awaiting decision and bed availability. Will continue to follow.  Gavinn Collard Graves Madden, MS, CCC-SLP Admissions Coordinator [phone]  

## 2021-12-07 NOTE — Progress Notes (Signed)
Physical Therapy Treatment ?Patient Details ?Name: Patrick Rose ?MRN: [id number] ?DOB: 26-Apr-1951 ?Today's Date: 12/07/2021 ? ? ?History of Present Illness Pt. is a 71 y.o. M presenting to [facility] on 3/30 with left hemiplegia, right gaze deviation, and slurred speech. Head CT shows hyperdense R MCA and 2/2 lethargy. He recieved TNK on 3/30.  PMH significant for a fib and stable aortic aneurysm. ? ?  ?PT Comments  ? ? Pt pleasant and motivated, was very independent and active prior.  Pt continues to have significant L neglect, easily distractible, needing max cues for focus on task at hand during session.  Pt with significant tone on L side - decreased throughout session.  Pt with poor EOB balance but making good progress during session (good progress after a L rotation).  Session focused on tone and balance at EOB.  Unable to progress to standing today.  ?  ?Recommendations for follow up therapy are one component of a multi-disciplinary discharge planning process, led by the attending physician.  Recommendations may be updated based on patient status, additional functional criteria and insurance authorization. ? ?Follow Up Recommendations ? Acute inpatient rehab (3hours/day) ?  ?  ?Assistance Recommended at Discharge Frequent or constant Supervision/Assistance  ?Patient can return home with the following Two people to help with walking and/or transfers;Two people to help with bathing/dressing/bathroom;Assistance with cooking/housework;Help with stairs or ramp for entrance ?  ?Equipment Recommendations ? BSC/3in1;Wheelchair (measurements PT);Wheelchair cushion (measurements PT);Rolling walker (2 wheels);Hospital bed;Other (comment) (hoyer)  ?  ?Recommendations for Other Services Rehab consult ? ? ?  ?Precautions / Restrictions Precautions ?Precautions: Fall ?Precaution Comments: pushing to L; L neglect  ?  ? ?Mobility ? Bed Mobility ?Overal bed mobility: Needs Assistance ?Bed Mobility: Supine to Sit, Sit to Supine ?  ?   ?Supine to sit: Max assist, +2 for physical assistance ?Sit to supine: Total assist, +2 for physical assistance ?  ?General bed mobility comments: Pt assisting some with max cues with R side, total assist for L side and to lift trunk; frequent cues ?  ? ?Transfers ?  ?  ?  ?  ?  ?  ?  ?  ?  ?General transfer comment: Pt unsafe to attempt standing - focused on EOB balance ?  ? ?Ambulation/Gait ?  ?  ?  ?  ?  ?  ?  ?  ? ? ?Stairs ?  ?  ?  ?  ?  ? ? ?Wheelchair Mobility ?  ? ?Modified Rankin (Stroke Patients Only) ?Modified Rankin (Stroke Patients Only) ?Pre-Morbid Rankin Score: No symptoms ?Modified Rankin: Severe disability ? ? ?  ?Balance Overall balance assessment: Needs assistance ?Sitting-balance support: Single extremity supported, Bilateral upper extremity supported, Feet supported ?Sitting balance-Leahy Scale: Poor ?Sitting balance - Comments: Session focused on EOB balance.  Sitting for 15-20 mins.  Pt initially with strong posterior lean and pushing toward L side requiring max A.  Tried different hand positions (R hand beside, rail, chair in front, knee) and good improvement with hand on chair or knee.  Pt also had good improvement after rotation to the L with max cues and facilitation - required frequent cues, had wife move behind L side for visual target, assisted with head and trunk movement initially and then able to progress L hand back.  Tone in L arm also improving after rotation and able to position on bed beside pt. After rotation, pt maintain static balance with min A majority of the time.  Progressing to leaning forwad  with both hands on knee and close min guard but required constant cues and guarding to keep R hand from reaching back. Pt would tend to lean L from this position- worked on finding midline with therapist imminating lean and having pt follow back (may do well with mirror). ?  ?  ?  ?Standing balance comment: unsafe to attempt today - too off balance, weak, distractable ?  ?  ?  ?  ?   ?  ?  ?  ?  ?  ?  ?  ? ?  ?Cognition Arousal/Alertness: Awake/alert ?Behavior During Therapy:  (talkative) ?Overall Cognitive Status: Impaired/Different from baseline ?Area of Impairment: Orientation, Attention, Memory, Following commands, Safety/judgement, Awareness, Problem solving ?  ?  ?  ?  ?  ?  ?  ?  ?Orientation Level: Disoriented to ?Current Attention Level: Sustained ?Memory: Decreased short-term memory ?Following Commands: Follows one step commands inconsistently (easily distracted) ?Safety/Judgement: Decreased awareness of deficits, Decreased awareness of safety ?Awareness: Emergent ?Problem Solving: Slow processing, Difficulty sequencing, Requires verbal cues, Requires tactile cues ?General Comments: Pt very friendly and talkative - easily distracted and frequently needing cues to focus on task at hand.  Significant L neglect with frequent cues for task at hand. ?  ?  ? ?  ?Exercises Other Exercises ?Other Exercises: Pt with significant tone in L side (UE improved in sitting - see OT note for further).  Worked on PROM/stretching ankle pumps x10.  Heel Slides x 10 with AAROM/stretching - tone in both directions loosening with repetition but pt requiring max cues to focus/look at leg to assist ? ?  ?General Comments General comments (skin integrity, edema, etc.): VSS ?  ?  ? ?Pertinent Vitals/Pain Pain Assessment ?Pain Assessment: No/denies pain  ? ? ?Home Living   ?  ?  ?  ?  ?  ?  ?  ?  ?  ?   ?  ?Prior Function    ?  ?  ?   ? ?PT Goals (current goals can now be found in the care plan section) Progress towards PT goals: Progressing toward goals ? ?  ?Frequency ? ? ? Min 4X/week ? ? ? ?  ?PT Plan Current plan remains appropriate  ? ? ?Co-evaluation PT/OT/SLP Co-Evaluation/Treatment: Yes ?Reason for Co-Treatment: Complexity of the patient's impairments (multi-system involvement);For patient/therapist safety;Other (comment) (good co-tx 2 skilled therapist; additionally no tech time available) ?  ?  ?  ? ?   ?AM-PAC PT "6 Clicks" Mobility   ?Outcome Measure ? Help needed turning from your back to your side while in a flat bed without using bedrails?: A Lot ?Help needed moving from lying on your back to sitting on the side of a flat bed without using bedrails?: Total ?Help needed moving to and from a bed to a chair (including a wheelchair)?: Total ?Help needed standing up from a chair using your arms (e.g., wheelchair or bedside chair)?: Total ?Help needed to walk in hospital room?: Total ?Help needed climbing 3-5 steps with a railing? : Total ?6 Click Score: 7 ? ?  ?End of Session   ?Activity Tolerance: Patient tolerated treatment well ?Patient left: in bed;with call bell/phone within reach;with bed alarm set;with family/visitor present ((family positioned on L side of pt)) ?Nurse Communication: Mobility status;Need for lift equipment ?PT Visit Diagnosis: Unsteadiness on feet (R26.81);Other abnormalities of gait and mobility (R26.89);Muscle weakness (generalized) (M62.81);Difficulty in walking, not elsewhere classified (R26.2);Apraxia (R48.2);Other symptoms and signs involving the nervous system (R29.898);Hemiplegia and hemiparesis ?  Hemiplegia - Right/Left: Left ?Hemiplegia - caused by: Cerebral infarction ?  ? ? ?Time: [id number] ?PT Time Calculation (min) (ACUTE ONLY): 32 min ? ?Charges:  $Neuromuscular Re-education: 8-22 mins          ?          ? ?Abran Richard, PT ?Acute Rehab Services ?Pager [phone] ?[facility] [id number] ? ? ? ?Mikael Spray Faraaz Wolin ?12/07/2021, 3:20 PM ? ?

## 2021-12-07 NOTE — Progress Notes (Addendum)
STROKE TEAM PROGRESS NOTE  ? ?INTERVAL HISTORY ?Patient was seen in his room with his wife at the bedside. He is tired today and his wife is helping him eat. He has been hemodynamically stable, his neurological exam is stable and he has had no acute events overnight.  Insurance authorization for CIR started. Discussed importance of regular sleep/wake cycle and sleep hygiene with patient and wife.  ? ?Vitals:  ? 12/06/21 2000 12/07/21 0000 12/07/21 0400 12/07/21 0743  ?BP: 138/71 140/73 140/72 127/77  ?Pulse: 67 68 68 60  ?Resp: 18 18 18 16   ?Temp: 97.9 ?F (36.6 ?C) 98 ?F (36.7 ?C) 98.4 ?F (36.9 ?C)   ?TempSrc: Oral Oral Oral   ?SpO2: 99% 98% 100% 99%  ?Weight:      ?Height:      ? ?CBC:  ?Recent Labs  ?Lab 12/03/21 ?1835 12/03/21 ?1837 12/04/21 ?0400 12/05/21 ?0415 12/06/21 ?0258 12/07/21 ?[id number]  ?WBC 10.3  --  10.0   < > 9.3 9.9  ?NEUTROABS 8.2*  --  9.2*  --   --   --   ?HGB 14.4   < > 13.2   < > 11.3* 12.2*  ?HCT 42.6   < > 39.4   < > 34.4* 36.0*  ?MCV 88.0  --  87.8   < > 89.1 87.2  ?PLT 199  --  203   < > 165 179  ? < > = values in this interval not displayed.  ? ? ?Basic Metabolic Panel:  ?Recent Labs  ?Lab 12/05/21 ?0415 12/06/21 ?0258 12/07/21 ?0307  ?NA 139 141 139  ?K 4.0 4.1 4.0  ?CL 110 108 108  ?CO2 23 27 23   ?GLUCOSE 136* 131* 114*  ?BUN 22 15 16   ?CREATININE 1.03 0.99 0.77  ?CALCIUM 8.5* 8.8* 8.9  ?MG 2.0  --   --   ?PHOS 2.9  --   --   ? ? ?Lipid Panel:  ?Recent Labs  ?Lab 12/04/21 ?0650  ?CHOL 172  ?TRIG 45  43  ?HDL 48  ?CHOLHDL 3.6  ?VLDL 9  ?LDLCALC 115*  ? ? ?HgbA1c:  ?Recent Labs  ?Lab 12/04/21 ?0400  ?HGBA1C 5.4  ? ? ?Urine Drug Screen:  ?Recent Labs  ?Lab 12/04/21 ?0500  ?LABOPIA NONE DETECTED  ?COCAINSCRNUR NONE DETECTED  ?LABBENZ NONE DETECTED  ?AMPHETMU NONE DETECTED  ?THCU NONE DETECTED  ?LABBARB NONE DETECTED  ? ? ?Alcohol Level No results for input(s): ETH in the last 168 hours. ? ?IMAGING past 24 hours ?CT HEAD WO CONTRAST (5MM) ? ?Result Date: 12/06/2021 ?CLINICAL DATA:  Stroke  follow-up. EXAM: CT HEAD WITHOUT CONTRAST TECHNIQUE: Contiguous axial images were obtained from the base of the skull through the vertex without intravenous contrast. RADIATION DOSE REDUCTION: This exam was performed according to the departmental dose-optimization program which includes automated exposure control, adjustment of the mA and/or kV according to patient size and/or use of iterative reconstruction technique. COMPARISON:  MR head 12/04/2021; CT head 12/03/2021 FINDINGS: Brain: Extensive right MCA territory infarct and moderate right ACA territory infarct, stable compared to 12/04/2021 MR head. Unchanged mass effect with near complete effacement of the frontal horn of the right lateral ventricle. There is approximately 3 mm of leftward midline shift, similar to most recent prior exam. No interval intracranial hemorrhage, herniation, extra-axial fluid collection or hydrocephalus. No definite correlate for the T2 FLAIR hyperintense signal abnormality within the superior right cerebellar hemisphere seen on MR head 12/04/2021. Vascular: No hyperdense vessel or unexpected calcification. Skull: Normal.  Negative for fracture or focal lesion. Sinuses/Orbits: No acute finding. Other: None. IMPRESSION: Extensive right MCA territory infarct and moderate right ACA territory infarct are stable compared to 12/04/2021. Unchanged mild leftward midline shift. No interval intracranial hemorrhage, herniation, or hydrocephalus. Electronically Signed   By: Sherron Ales M.D.   On: 12/06/2021 17:27   ? ?PHYSICAL EXAM ?Constitutional: Elderly white male  ?Respiratory: Regular, unlabored respirations ? ?NEURO:  ?Mental Status: AA&Ox1, eyes are closed with eye opening apraxia but follows commands quite briskly. ?Speech/Language: speech is without dysarthria or aphasia.  Naming and comprehension intact. ? ?Cranial Nerves:  ?II: PERRL.  ?III, IV, VI: EOMI. Eyelids elevate symmetrically.  ?V: Sensation is intact to light touch and  symmetrical to face.  ?VII: Left sided facial droop noted ?VIII: hearing intact to voice. ?IX, X:  Phonation is normal.  ?XII: tongue is midline without fasciculations. ?Motor: 5/5 strength to RUE and RLE, 2/5 to LLE, no movement of LUE ?Tone: is increased on the left side ?Sensation- Intact to light touch bilaterally.  ?Gait- deferred ? ? ?ASSESSMENT/PLAN ?Patrick Rose is a 71 y.o. male with history of Afib no anticoagulation (chadsvasc 1 prior cva) but daily asa 81, stable aortic aneurysm presenting with left hemiplegia and right gaze deviation. He was extubated 3/31 and is doing well. Moved to 3W and pending CIR admission.  ? ?Stroke: Right cortical M1/MCA occlusion s/p tNK and mechanical thrombectomy, TICI 3 - (12/03/2021). Etiology presumably cardio-embolic source in the setting of afib not on anticoagulation due to low CHA2DS2-VASc score of 1 ?Code Stroke CT head:  Hyperdensity right MCA  ?Repeat Head CT- Extensive right MCA territory infarct and moderate right ACA territory infarct are stable compared to 12/04/2021. Unchanged mild leftward midline shift. No interval intracranial hemorrhage, herniation, or hydrocephalus. ?MRI: Right cortical M1/MCA occlusion, wedge shape, within the right frontal lobe, right parietal lobe, right temporal lobe, right insula/subinsular region, right basal ganglia, anterior limb of right internal capsule and right corona radiata. Minimal multifocal T2 FLAIR hyperintense signal abnormality elsewhere within the cerebral white matter. 3.6 cm focus of T2 FLAIR hyperintense signal abnormality within the superior right cerebellar hemisphere. Punctate chronic microhemorrhage within the left cerebellar hemisphere  ?CTA head: Right M1/MCA occlusion ?CTA neck: B/l carotids and vertebral arteries without significant stenosis or dissection.  ?2D Echo: EF 55-60%.  ?LDL 115  (12/04/2021) ?HgbA1c 5.4 (12/04/2021)  ?VTE prophylaxis - Scd's start: 12/03/21 1912, Sequential compression devices,  below knee,  ?aspirin 81 mg daily prior to admission, anticoagulation after 48 to 72 hours given large size of infarct  ?therapy recommendations: CIR ?Disposition: Acute inpatient rehab (3hours/day) inpt rehab. ? ?Hypertension ?Home meds: N/A  ?Stable ?Permissive hypertension (OK if < 220/120) 1-2 days but gradually normalize in 5-7 days ?Long-term BP goal normotensive ? ?Hyperlipidemia ?Home meds:  N/A ?LDL 115  (12/04/2021), goal < 70  ?Added Crestor 20 mg daily  ?High intensity statin  ?Continue statin at discharge ? ?Other Stroke Risk Factors ?Afib no anticoagulation - chadsvasc 3  ?Advanced Age >/= 80  ? ?Other Active Problems ?Aortic aneurysm - stable ? ?Hospital day # 4 ? ?Patient seen and examined by NP/APP with MD. MD to update note as needed.  ? ?Elmer Picker, DNP, FNP-BC ?[facility] ?Pager: [phone] ?I have personally obtained history,examined this patient, reviewed notes, independently viewed imaging studies, participated in medical decision making and plan of care.ROS completed by me personally and pertinent positives fully documented  I have made any additions or  clarifications directly to the above note. Agree with note above.  Patient continues to have significant eye-opening apraxia and dense left hemiplegia.  Continue aspirin for now and repeat CT head tomorrow morning and if hemorrhagic transformation is stable will consider resuming anticoagulation.  Long discussion with patient and wife at the bedside and answered questions.  Greater than 50% time during this 35-minute visit was spent in counseling and coordination of care and discussion about risks stroke and anticoagulation risk-benefit and answering questions ? ?Delia Heady, MD ?Medical Director ?[facility] ?Pager: [phone] ?12/07/2021 3:08 PM ? ? ?

## 2021-12-07 NOTE — Care Management Important Message (Signed)
Important Message ? ?Patient Details  ?Name: Patrick Rose ?MRN: [id number] ?Date of Birth: 11/07/1950 ? ? ?Medicare Important Message Given:  Yes ? ? ? ? ?Darivs Lunden ?12/07/2021, 2:42 PM ?

## 2021-12-07 NOTE — Progress Notes (Signed)
Occupational Therapy Treatment ?Patient Details ?Name: Patrick Rose ?MRN: [id number] ?DOB: 1951-08-06 ?Today's Date: 12/07/2021 ? ? ?History of present illness Pt. is a 71 y.o. M presenting to [facility] on 3/30 with left hemiplegia, right gaze deviation, and slurred speech. Head CT shows hyperdense R MCA and 2/2 lethargy. He recieved TNK on 3/30.  PMH significant for a fib and stable aortic aneurysm. ?  ?OT comments ? Patient continues to make incremental progress towards goals in skilled OT session. [name] session encompassed co-treat with PT due to significant tone on L arm and leg, working on bed mobility, sitting EOB, and following simple commands. Patient max of 2 to transition to EOB with significant posterior lean. Patient requiring tactile cues at R hand (placing R hand on R knee) to combat pushing. Patient with heavy L neglect with min-mod facilitation to initiate neck rotation. OT and PT using wife as visual cue for patient to look to L, able to maintain for 5-10 seconds prior to fatigue. Patient very tangential and requiring max cues in order to attend to simple commands. Poor initiation at trunk to initiate attempt to stand, patient needing to be dependent lift transfer to the recliner at this stage. Continue to recommend AIR to regain maximum function. OT will continue to follow.   ? ?Recommendations for follow up therapy are one component of a multi-disciplinary discharge planning process, led by the attending physician.  Recommendations may be updated based on patient status, additional functional criteria and insurance authorization. ?   ?Follow Up Recommendations ? Acute inpatient rehab (3hours/day)  ?  ?Assistance Recommended at Discharge Frequent or constant Supervision/Assistance  ?Patient can return home with the following ? A lot of help with walking and/or transfers;A lot of help with bathing/dressing/bathroom;Assistance with cooking/housework;Assistance with feeding;Direct supervision/assist for  medications management;Direct supervision/assist for financial management;Assist for transportation;Help with stairs or ramp for entrance ?  ?Equipment Recommendations ? BSC/3in1;Tub/shower seat;Wheelchair (measurements OT)  ?  ?Recommendations for Other Services   ? ?  ?Precautions / Restrictions Precautions ?Precautions: Fall ?Precaution Comments: pushing to L; L neglect ?Restrictions ?Weight Bearing Restrictions: No  ? ? ?  ? ?Mobility Bed Mobility ?Overal bed mobility: Needs Assistance ?Bed Mobility: Supine to Sit, Sit to Supine ?  ?  ?Supine to sit: Max assist, +2 for physical assistance ?Sit to supine: Total assist, +2 for physical assistance ?  ?General bed mobility comments: Pt assisting some with max cues with R side, total assist for L side and to lift trunk; frequent cues ?  ? ?Transfers ?  ?  ?  ?  ?  ?  ?  ?  ?  ?General transfer comment: Pt unsafe to attempt standing - focused on EOB balance ?  ?  ?Balance Overall balance assessment: Needs assistance ?Sitting-balance support: Single extremity supported, Bilateral upper extremity supported, Feet supported ?Sitting balance-Leahy Scale: Poor ?Sitting balance - Comments: Session focused on EOB balance.  Sitting for 15-20 mins.  Pt initially with strong posterior lean and pushing toward L side requiring max A.  Tried different hand positions (R hand beside, rail, chair in front, knee) and good improvement with hand on chair or knee.  Pt also had good improvement after rotation to the L with max cues and facilitation - required frequent cues, had wife move behind L side for visual target, assisted with head and trunk movement initially and then able to progress L hand back.  Tone in L arm also improving after rotation and able to position on  bed beside pt. After rotation, pt maintain static balance with min A majority of the time.  Progressing to leaning forwad with both hands on knee and close min guard but required constant cues and guarding to keep R hand  from reaching back. Pt would tend to lean L from this position- worked on finding midline with therapist imminating lean and having pt follow back (may do well with mirror). ?  ?  ?Standing balance-Leahy Scale: Zero ?Standing balance comment: unsafe to attempt today - too off balance, weak, distractable ?  ?  ?  ?  ?  ?  ?  ?  ?  ?  ?  ?   ? ?ADL either performed or assessed with clinical judgement  ? ?ADL Overall ADL's : Needs assistance/impaired ?  ?  ?  ?  ?Upper Body Bathing: Maximal assistance;Sitting ?  ?  ?  ?Upper Body Dressing : Maximal assistance;Sitting ?  ?Lower Body Dressing: Total assistance;Bed level;Sit to/from stand ?  ?Toilet Transfer: Total assistance ?Toilet Transfer Details (indicate cue type and reason): unable to safely attempt this date ?  ?  ?  ?  ?Functional mobility during ADLs: Maximal assistance;+2 for safety/equipment;Cueing for safety;Cueing for sequencing ?General ADL Comments: session focus on decreasing flexor tone, increasing activity tolerance, and command following ?  ? ?Extremity/Trunk Assessment   ?  ?  ?  ?  ?  ? ?Vision   ?  ?  ?Perception   ?  ?Praxis   ?  ? ?Cognition Arousal/Alertness: Awake/alert ?Behavior During Therapy:  (tangential and talkative) ?Overall Cognitive Status: Impaired/Different from baseline ?Area of Impairment: Orientation, Attention, Memory, Following commands, Safety/judgement, Awareness, Problem solving ?  ?  ?  ?  ?  ?  ?  ?  ?Orientation Level: Disoriented to (Mod I locating calendar in room) ?Current Attention Level: Sustained ?Memory: Decreased short-term memory ?Following Commands: Follows one step commands inconsistently (easily distracted) ?Safety/Judgement: Decreased awareness of deficits, Decreased awareness of safety ?Awareness: Emergent ?Problem Solving: Slow processing, Difficulty sequencing, Requires verbal cues, Requires tactile cues ?General Comments: Pt very friendly and talkative - easily distracted and frequently needing cues to  focus on task at hand.  Significant L neglect with frequent cues for task at hand. ?  ?  ?   ?Exercises   ? ?  ?Shoulder Instructions   ? ? ?  ?General Comments VSS  ? ? ?Pertinent Vitals/ Pain       Pain Assessment ?Pain Assessment: No/denies pain ? ?Home Living   ?  ?  ?  ?  ?  ?  ?  ?  ?  ?  ?  ?  ?  ?  ?  ?  ?  ?  ? ?  ?Prior Functioning/Environment    ?  ?  ?  ?   ? ?Frequency ? Min 2X/week  ? ? ? ? ?  ?Progress Toward Goals ? ?OT Goals(current goals can now be found in the care plan section) ? Progress towards OT goals: Progressing toward goals ? ?Acute Rehab OT Goals ?Patient Stated Goal: regain maximum function ?OT Goal Formulation: With patient/family ?Time For Goal Achievement: 12/19/21 ?Potential to Achieve Goals: Fair  ?Plan Discharge plan remains appropriate   ? ?Co-evaluation ? ? ? PT/OT/SLP Co-Evaluation/Treatment: Yes ?Reason for Co-Treatment: Complexity of the patient's impairments (multi-system involvement);For patient/therapist safety;To address functional/ADL transfers (good co-tx 2 skilled therapist; additionally no tech time available) ?  ?OT goals addressed during session: ADL's and self-care;Strengthening/ROM ?  ? ?  ?  AM-PAC OT "6 Clicks" Daily Activity     ?Outcome Measure ? ? Help from another person eating meals?: A Lot ?Help from another person taking care of personal grooming?: A Lot ?Help from another person toileting, which includes using toliet, bedpan, or urinal?: Total ?Help from another person bathing (including washing, rinsing, drying)?: A Lot ?Help from another person to put on and taking off regular upper body clothing?: A Lot ?Help from another person to put on and taking off regular lower body clothing?: Total ?6 Click Score: 10 ? ?  ?End of Session   ? ?OT Visit Diagnosis: Unsteadiness on feet (R26.81);Apraxia (R48.2);Hemiplegia and hemiparesis;Muscle weakness (generalized) (M62.81) ?Hemiplegia - Right/Left: Left ?Hemiplegia - dominant/non-dominant: Non-Dominant ?Hemiplegia  - caused by: Cerebral infarction ?  ?Activity Tolerance Patient tolerated treatment well ?  ?Patient Left in bed;with call bell/phone within reach;with nursing/sitter in room;with bed alarm set ?  ?Nurse

## 2021-12-08 ENCOUNTER — Other Ambulatory Visit: Payer: Self-pay

## 2021-12-08 ENCOUNTER — Inpatient Hospital Stay (HOSPITAL_COMMUNITY): Payer: Medicare (Managed Care)

## 2021-12-08 ENCOUNTER — Other Ambulatory Visit (HOSPITAL_COMMUNITY): Payer: Self-pay

## 2021-12-08 ENCOUNTER — Encounter (HOSPITAL_COMMUNITY): Payer: Self-pay | Admitting: Physical Medicine & Rehabilitation

## 2021-12-08 ENCOUNTER — Inpatient Hospital Stay (HOSPITAL_COMMUNITY)
Admission: RE | Admit: 2021-12-08 | Discharge: 2022-01-05 | DRG: 056 | Disposition: A | Discharge status: Skilled Nursing Facility | Payer: Medicare (Managed Care) | Source: Intra-hospital | Attending: Physical Medicine & Rehabilitation | Admitting: Physical Medicine & Rehabilitation

## 2021-12-08 ENCOUNTER — Encounter (HOSPITAL_COMMUNITY): Payer: Self-pay | Admitting: Neurology

## 2021-12-08 ENCOUNTER — Inpatient Hospital Stay (HOSPITAL_COMMUNITY): Payer: Medicare HMO

## 2021-12-08 DIAGNOSIS — R159 Full incontinence of feces: Secondary | ICD-10-CM | POA: Diagnosis not present

## 2021-12-08 DIAGNOSIS — Z87891 Personal history of nicotine dependence: Secondary | ICD-10-CM

## 2021-12-08 DIAGNOSIS — Z880 Allergy status to penicillin: Secondary | ICD-10-CM | POA: Diagnosis not present

## 2021-12-08 DIAGNOSIS — R066 Hiccough: Secondary | ICD-10-CM | POA: Diagnosis not present

## 2021-12-08 DIAGNOSIS — Z79899 Other long term (current) drug therapy: Secondary | ICD-10-CM

## 2021-12-08 DIAGNOSIS — K5900 Constipation, unspecified: Secondary | ICD-10-CM | POA: Diagnosis present

## 2021-12-08 DIAGNOSIS — I719 Aortic aneurysm of unspecified site, without rupture: Secondary | ICD-10-CM | POA: Diagnosis present

## 2021-12-08 DIAGNOSIS — E785 Hyperlipidemia, unspecified: Secondary | ICD-10-CM | POA: Diagnosis present

## 2021-12-08 DIAGNOSIS — I482 Chronic atrial fibrillation, unspecified: Secondary | ICD-10-CM | POA: Diagnosis not present

## 2021-12-08 DIAGNOSIS — K5901 Slow transit constipation: Secondary | ICD-10-CM

## 2021-12-08 DIAGNOSIS — R414 Neurologic neglect syndrome: Secondary | ICD-10-CM

## 2021-12-08 DIAGNOSIS — R1310 Dysphagia, unspecified: Secondary | ICD-10-CM

## 2021-12-08 DIAGNOSIS — R7301 Impaired fasting glucose: Secondary | ICD-10-CM | POA: Diagnosis present

## 2021-12-08 DIAGNOSIS — I69354 Hemiplegia and hemiparesis following cerebral infarction affecting left non-dominant side: Secondary | ICD-10-CM | POA: Diagnosis present

## 2021-12-08 DIAGNOSIS — R400 Somnolence: Secondary | ICD-10-CM | POA: Diagnosis not present

## 2021-12-08 DIAGNOSIS — L8990 Pressure ulcer of unspecified site, unspecified stage: Secondary | ICD-10-CM | POA: Insufficient documentation

## 2021-12-08 DIAGNOSIS — R7401 Elevation of levels of liver transaminase levels: Secondary | ICD-10-CM | POA: Diagnosis not present

## 2021-12-08 DIAGNOSIS — I4891 Unspecified atrial fibrillation: Secondary | ICD-10-CM | POA: Diagnosis present

## 2021-12-08 DIAGNOSIS — Z981 Arthrodesis status: Secondary | ICD-10-CM

## 2021-12-08 DIAGNOSIS — R5383 Other fatigue: Secondary | ICD-10-CM | POA: Diagnosis present

## 2021-12-08 DIAGNOSIS — I69322 Dysarthria following cerebral infarction: Secondary | ICD-10-CM

## 2021-12-08 DIAGNOSIS — Z79890 Hormone replacement therapy (postmenopausal): Secondary | ICD-10-CM | POA: Diagnosis not present

## 2021-12-08 DIAGNOSIS — I63511 Cerebral infarction due to unspecified occlusion or stenosis of right middle cerebral artery: Secondary | ICD-10-CM | POA: Diagnosis not present

## 2021-12-08 DIAGNOSIS — Z7982 Long term (current) use of aspirin: Secondary | ICD-10-CM

## 2021-12-08 DIAGNOSIS — H53462 Homonymous bilateral field defects, left side: Secondary | ICD-10-CM | POA: Diagnosis present

## 2021-12-08 DIAGNOSIS — G4700 Insomnia, unspecified: Secondary | ICD-10-CM | POA: Diagnosis not present

## 2021-12-08 DIAGNOSIS — L89151 Pressure ulcer of sacral region, stage 1: Secondary | ICD-10-CM | POA: Diagnosis not present

## 2021-12-08 DIAGNOSIS — L89321 Pressure ulcer of left buttock, stage 1: Secondary | ICD-10-CM | POA: Diagnosis not present

## 2021-12-08 DIAGNOSIS — W19XXXD Unspecified fall, subsequent encounter: Secondary | ICD-10-CM | POA: Diagnosis present

## 2021-12-08 DIAGNOSIS — S7011XD Contusion of right thigh, subsequent encounter: Secondary | ICD-10-CM

## 2021-12-08 DIAGNOSIS — R1312 Dysphagia, oropharyngeal phase: Secondary | ICD-10-CM | POA: Diagnosis not present

## 2021-12-08 DIAGNOSIS — I6931 Attention and concentration deficit following cerebral infarction: Secondary | ICD-10-CM

## 2021-12-08 DIAGNOSIS — I69391 Dysphagia following cerebral infarction: Secondary | ICD-10-CM

## 2021-12-08 DIAGNOSIS — D62 Acute posthemorrhagic anemia: Secondary | ICD-10-CM

## 2021-12-08 DIAGNOSIS — G8114 Spastic hemiplegia affecting left nondominant side: Secondary | ICD-10-CM | POA: Diagnosis not present

## 2021-12-08 DIAGNOSIS — M549 Dorsalgia, unspecified: Secondary | ICD-10-CM | POA: Diagnosis present

## 2021-12-08 DIAGNOSIS — Z7901 Long term (current) use of anticoagulants: Secondary | ICD-10-CM

## 2021-12-08 DIAGNOSIS — M25552 Pain in left hip: Secondary | ICD-10-CM

## 2021-12-08 DIAGNOSIS — I69398 Other sequelae of cerebral infarction: Secondary | ICD-10-CM

## 2021-12-08 DIAGNOSIS — H538 Other visual disturbances: Secondary | ICD-10-CM | POA: Diagnosis present

## 2021-12-08 DIAGNOSIS — R21 Rash and other nonspecific skin eruption: Secondary | ICD-10-CM | POA: Diagnosis not present

## 2021-12-08 DIAGNOSIS — I6601 Occlusion and stenosis of right middle cerebral artery: Secondary | ICD-10-CM

## 2021-12-08 DIAGNOSIS — R252 Cramp and spasm: Secondary | ICD-10-CM | POA: Diagnosis present

## 2021-12-08 DIAGNOSIS — G936 Cerebral edema: Secondary | ICD-10-CM | POA: Diagnosis present

## 2021-12-08 DIAGNOSIS — S7001XD Contusion of right hip, subsequent encounter: Secondary | ICD-10-CM

## 2021-12-08 DIAGNOSIS — R200 Anesthesia of skin: Secondary | ICD-10-CM | POA: Diagnosis present

## 2021-12-08 DIAGNOSIS — R1313 Dysphagia, pharyngeal phase: Secondary | ICD-10-CM | POA: Diagnosis present

## 2021-12-08 DIAGNOSIS — L299 Pruritus, unspecified: Secondary | ICD-10-CM | POA: Diagnosis not present

## 2021-12-08 DIAGNOSIS — R001 Bradycardia, unspecified: Secondary | ICD-10-CM | POA: Diagnosis not present

## 2021-12-08 DIAGNOSIS — I639 Cerebral infarction, unspecified: Secondary | ICD-10-CM | POA: Diagnosis not present

## 2021-12-08 MED ORDER — APIXABAN 5 MG PO TABS: 5.0000 mg | ORAL_TABLET | Freq: Two times a day (BID) | ORAL | 1 refills | Status: AC

## 2021-12-08 MED ORDER — ACETAMINOPHEN 325 MG PO TABS
325.0000 mg | ORAL_TABLET | ORAL | Status: DC | PRN
  Administered 2021-12-09 – 2022-01-05 (×8): 650 mg via ORAL
  Filled 2021-12-08 (×9): qty 2

## 2021-12-08 MED ORDER — PROCHLORPERAZINE EDISYLATE 10 MG/2ML IJ SOLN: 5.0000 mg | Freq: Four times a day (QID) | INTRAMUSCULAR | Status: DC | PRN

## 2021-12-08 MED ORDER — PANTOPRAZOLE SODIUM 40 MG PO TBEC: 40.0000 mg | DELAYED_RELEASE_TABLET | Freq: Every day | ORAL | Status: DC

## 2021-12-08 MED ORDER — APIXABAN 5 MG PO TABS
5.0000 mg | ORAL_TABLET | Freq: Two times a day (BID) | ORAL | Status: DC
Start: 2021-12-08 — End: 2021-12-08

## 2021-12-08 MED ORDER — GUAIFENESIN-DM 100-10 MG/5ML PO SYRP: 5.0000 mL | ORAL_SOLUTION | Freq: Four times a day (QID) | ORAL | Status: DC | PRN

## 2021-12-08 MED ORDER — DIPHENHYDRAMINE HCL 12.5 MG/5ML PO ELIX
12.5000 mg | ORAL_SOLUTION | Freq: Four times a day (QID) | ORAL | Status: DC | PRN
  Administered 2022-01-01 – 2022-01-02 (×2): 25 mg via ORAL
  Filled 2021-12-08 (×3): qty 10

## 2021-12-08 MED ORDER — BISACODYL 10 MG RE SUPP
10.0000 mg | Freq: Every day | RECTAL | Status: DC | PRN
Start: 2021-12-08 — End: 2021-12-25

## 2021-12-08 MED ORDER — SENNOSIDES-DOCUSATE SODIUM 8.6-50 MG PO TABS
2.0000 | ORAL_TABLET | Freq: Every day | ORAL | Status: DC
  Administered 2021-12-08 – 2021-12-14 (×6): 2 via ORAL
  Filled 2021-12-08 (×7): qty 2

## 2021-12-08 MED ORDER — DOCUSATE SODIUM 50 MG/5ML PO LIQD: 100.0000 mg | Freq: Two times a day (BID) | ORAL | Status: DC

## 2021-12-08 MED ORDER — DICLOFENAC SODIUM 1 % EX GEL
2.0000 g | Freq: Four times a day (QID) | CUTANEOUS | Status: DC
  Administered 2021-12-08 – 2022-01-05 (×106): 2 g via TOPICAL
  Filled 2021-12-08 (×2): qty 100

## 2021-12-08 MED ORDER — TRAZODONE HCL 50 MG PO TABS
25.0000 mg | ORAL_TABLET | Freq: Every evening | ORAL | Status: DC | PRN
  Administered 2021-12-09 – 2022-01-02 (×13): 50 mg via ORAL
  Filled 2021-12-08 (×18): qty 1

## 2021-12-08 MED ORDER — PROCHLORPERAZINE 25 MG RE SUPP: 12.5000 mg | Freq: Four times a day (QID) | RECTAL | Status: DC | PRN

## 2021-12-08 MED ORDER — APIXABAN 5 MG PO TABS
5.0000 mg | ORAL_TABLET | Freq: Two times a day (BID) | ORAL | Status: DC
  Administered 2021-12-08 – 2022-01-05 (×56): 5 mg via ORAL
  Filled 2021-12-08 (×56): qty 1

## 2021-12-08 MED ORDER — PROCHLORPERAZINE MALEATE 5 MG PO TABS: 5.0000 mg | ORAL_TABLET | Freq: Four times a day (QID) | ORAL | Status: DC | PRN

## 2021-12-08 MED ORDER — ROSUVASTATIN CALCIUM 20 MG PO TABS: 20.0000 mg | ORAL_TABLET | Freq: Every day | ORAL | 1 refills | Status: DC

## 2021-12-08 MED ORDER — PANTOPRAZOLE 2 MG/ML SUSPENSION
40.0000 mg | Freq: Every day | ORAL | Status: DC
  Administered 2021-12-08 – 2021-12-30 (×23): 40 mg via ORAL
  Filled 2021-12-08 (×23): qty 20

## 2021-12-08 MED ORDER — FLEET ENEMA 7-19 GM/118ML RE ENEM: 1.0000 | ENEMA | Freq: Once | RECTAL | Status: DC | PRN

## 2021-12-08 MED ORDER — POLYETHYLENE GLYCOL 3350 17 G PO PACK: 17.0000 g | PACK | Freq: Every day | ORAL | Status: DC

## 2021-12-08 MED ORDER — PANTOPRAZOLE 2 MG/ML SUSPENSION: 40.0000 mg | Freq: Every day | ORAL | Status: DC

## 2021-12-08 MED ORDER — ALUM & MAG HYDROXIDE-SIMETH 200-200-20 MG/5ML PO SUSP: 30.0000 mL | ORAL | Status: DC | PRN

## 2021-12-08 MED ORDER — ROSUVASTATIN CALCIUM 20 MG PO TABS
20.0000 mg | ORAL_TABLET | Freq: Every day | ORAL | Status: DC
  Administered 2021-12-08 – 2022-01-04 (×28): 20 mg via ORAL
  Filled 2021-12-08 (×28): qty 1

## 2021-12-08 MED ORDER — FOOD THICKENER (SIMPLYTHICK): 10.0000 | ORAL | Status: DC | PRN

## 2021-12-08 NOTE — Progress Notes (Signed)
Meredith Staggers, MD  ?Physician ?Physical Medicine and Rehabilitation ?PMR Pre-admission     ?Signed ?Date of Service:  12/06/2021  1:02 PM ? Related encounter: ED to Hosp-Admission (Current) from 12/03/2021 in [place] ?  ?Signed    ?  ?Show:Clear all ?[x] Written[x] Templated[] Copied ? ?Added by: ?[x] Cristina Gong, RN[x] Lind Covert, Runell Gess, CCC-SLP[x] Meredith Staggers, MD ? ?[] Hover for details ?   ?   ?   ?   ?   ?   ?   ?   ?   ?   ?   ?   ?   ?   ?   ?   ?   ?   ?   ?   ?   ?   ?   ?   ?   ?   ?   ?   ?   ?   ?   ?   ?   ?   ?   ?   ?   ?   ?   ?   ?   ?   ?   ?   ?   ?   ?   ?   ?   ?   ?   ?   ?   ?   ?   ?   ?   ?   ?   ?   ?   ?   ?   ?   ?   ?   ?   ?   ?   ?   ?   ?   ?   ?   ?   ?   ?   ?   ?   ?   ?   ?   ?   ?   ?   ?   ?   ?   ?   ?   ?   ?   ?   ?   ?   ?   ?   ?   ?   ?   ?   ?   ?   ?   ?   ?   ?   ?   ?   ?   ?   ?   ?   ?   ?   ?   ?   ?   ?   ?   ?   ?   ?   ?   ?   ?   ?   ?   ?   ?   ?   ?   ?   ?   ?   ?   ?   ?   ?   ?   ?   ?   ?   ?   ?   ?   ?   ?PMR Admission Coordinator Pre-Admission Assessment ?  ?Patient: Patrick Rose is an 71 y.o., male ?MRN: [id number] ?DOB: 08-10-1951 ?Height: 6' 1"  (185.4 cm) ?Weight: 96.4 kg ?  ?Insurance Information ?HMO: yes    PPO:      PCP:      IPA:      80/20:      OTHER:  ?PRIMARY: [employer] Medicare      Policy#: [id number]      Subscriber: patient ?CM Name: Barbera Setters      Phone#: [phone]  Fax#: [phone] ?Pre-Cert#: [id number]   approved for 5 days with f/u with Marcene Brawn phone [phone] fax [phone]   Employer:  ?Benefits:  Phone #: [phone]     Name:  ?Eff. Date: 09/06/20-still active     Deduct: does not have one      Out of Pocket Max: $3,500 ($280 met)      Life Max: NA ?CIR: $250/day co-pay with max co-pay of $1,000/admission      SNF: 100% coverage days 1-20, $196/day co-pay for days 21-100 ?Outpatient: $20/visit co-pay     Co-Pay:  ?Home Health: 100% coverage      Co-Pay:  ?DME: 80% coverage     Co-Pay: 20%  co-insurance ?Providers: in-network ?SECONDARY:       Policy#:      Phone#:  ?  ?Financial Counselor:       Phone#:  ?  ?The ?Data Collection Information Summary? for patients in Inpatient Rehabilitation Facilities with attached ?Privacy Act [place] Records? was provided and verbally reviewed with: Patient and Family ?  ?Emergency Contact Information ?Contact Information   ?  ?  Name Relation Home Work Mobile  ?  Janowski,Rebacca Spouse     [phone]  ?  Hamilton,Angie Daughter     [phone]  ?  ?   ?  ?  ?Current Medical History  ?Patient Admitting Diagnosis: R MCA CVA ?  ?History of Present Illness:  71 year old male with medical hx significant for: A-fib, aortic aneurysm. Pt presented to [facility] ED on 12/03/21 as a code stroke. Pt had left hemiplegia and right gaze deviation. CT head revealed hyperdensity right MCA. CTA showed acute occlusion of right MCA likely secondary to embolus. Pt received TNK and taken emergently to IR. Pt underwent right common carotid arteriogram and mechanical thrombectomy.  Etiology presumably cardio-embolic source in the setting of afib not on anticoagulation due to low [place] score. Pt had revascularization of right MCA. Pt developed groin hematoma. Follow up MRI brain showed patchy right MCA cortical and subcortical infarct without any hemorrhagic transformation, midline shift, or mass effect.  ?  ?CTA neck with bilateral carotids and vertebral arteries without significant stenosis or dissection. 2 d echo with EF 55-60%.  Hgb A1c 5.4. ASA 81 mg pta. Plan anticoagulation after 48 to 72 hours given large size of infarct. Allow permissive HTN. Added Crestor for LDL 115.  ?  ?Complete NIHSS TOTAL: 14 ?  ?[name] medical record from [facility] has been reviewed by the rehabilitation admission coordinator and physician. ?  ?Past Medical History  ?    ?Past Medical History:  ?Diagnosis Date  ? Afib ([place])    ?  ?Has the patient had major surgery during 100 days  prior to admission? Yes ?  ?Family History   ?family history is not on file. ?  ?Current Medications ?  ?Current Facility-Administered Medications:  ?  0.9 %  sodium chloride infusion, , Intravenous, Continuous, Derek Jack, MD, Last Rate: 50 mL/hr at 12/06/21 2322, New Bag at 12/06/21 2322 ?  acetaminophen (TYLENOL) tablet 650 mg, 650 mg, Oral, Q4H PRN, 650 mg at 12/06/21 1112 **OR** acetaminophen (TYLENOL) 160 MG/5ML solution 650 mg, 650 mg, Per Tube, Q4H PRN **OR** acetaminophen (TYLENOL) suppository 650 mg, 650 mg, Rectal, Q4H PRN, Derek Jack, MD ?  apixaban (ELIQUIS) tablet 5 mg, 5 mg, Oral, BID, Shafer, Devon, NP ?  docusate (COLACE) 50 MG/5ML liquid 100 mg, 100 mg, Oral, BID, Garvin Fila, MD, 100  mg at 12/08/21 0830 ?  food thickener (SIMPLYTHICK (NECTAR/LEVEL 2/MILDLY THICK)) 10 packet, 10 packet, Oral, PRN, Garvin Fila, MD ?  hydrALAZINE (APRESOLINE) injection 25 mg, 25 mg, Intravenous, Q8H PRN, Garvin Fila, MD ?  labetalol (NORMODYNE) injection 20-40 mg, 20-40 mg, Intravenous, Q2H PRN, Garvin Fila, MD, 20 mg at 12/04/21 1321 ?  pantoprazole sodium (PROTONIX) 40 mg/20 mL oral suspension 40 mg, 40 mg, Per Tube, QHS, Shafer, Devon, NP, 40 mg at 12/07/21 2251 ?  polyethylene glycol (MIRALAX / GLYCOLAX) packet 17 g, 17 g, Oral, Daily, Garvin Fila, MD, 17 g at 12/08/21 0830 ?  rosuvastatin (CRESTOR) tablet 20 mg, 20 mg, Oral, QHS, Garvin Fila, MD, 20 mg at 12/07/21 2251 ?  senna-docusate (Senokot-S) tablet 1 tablet, 1 tablet, Oral, QHS PRN, Derek Jack, MD ?  sodium chloride flush (NS) 0.9 % injection 3 mL, 3 mL, Intravenous, Once, Horton, Kristie M, DO ?  ?Patients Current Diet:  ?Diet Order   ?  ?         ?    DIET DYS 2 Room service appropriate? Yes with Assist; Fluid consistency: Nectar Thick  Diet effective now       ?  ?  ?   ?  ?  ?   ?  ?Precautions / Restrictions ?Precautions ?Precautions: Fall ?Precaution Comments: pushing to L; L neglect ?Restrictions ?Weight  Bearing Restrictions: No  ?  ?Has the patient had 2 or more falls or a fall with injury in the past year? No ?  ?Prior Activity Level ?Community (5-7x/wk): get out of house reguraly. Rides 4 to 5 times per week, bike. Very active ?  ?Prior Functional Level ?Self Care: Did the patient need help bathing, dressing, using the toilet or eating? Independent ?  ?Indoor Mobility: Did the patient need assistance with walking from room to room (with or without device)? Independent ?  ?Stairs: Did the patient need assistance with internal or external stairs (with or without device)? Independent ?  ?Functional Cognition: Did the patient need help planning regular tasks such as shopping or remembering to take medications? Independent ?  ?Patient Information ?Are you of Hispanic, Latino/a,or Spanish origin?: A. No, not of Hispanic, Latino/a, or Spanish origin ?What is your race?: A. White ?Do you need or want an interpreter to communicate with a doctor or health care staff?: 0. No ?  ?[name] Response To:  ?Health Literacy and Transportation ?Is the patient able to respond to health literacy and transportation needs?: Yes ?Health Literacy - How often do you need to have someone help you when you read instructions, pamphlets, or other written material from your doctor or pharmacy?: Never ?In the past 12 months, has lack of transportation kept you from medical appointments or from getting medications?: No ?In the past 12 months, has lack of transportation kept you from meetings, work, or from getting things needed for daily living?: No ?  ?Home Assistive Devices / Equipment ?Home Assistive Devices/Equipment: None ?Home Equipment: None ?  ?Prior Device Use: Indicate devices/aids used by the patient prior to current illness, exacerbation or injury? None of the above ?  ?Current Functional Level ?Cognition ?  Arousal/Alertness: Awake/alert ?Overall Cognitive Status: Impaired/Different from baseline ?Current Attention Level:  Focused ?Orientation Level: Oriented X4 ?Following Commands: Follows one step commands consistently, Follows multi-step commands inconsistently ?Safety/Judgement: Decreased awareness of deficits, Decreased aware

## 2021-12-08 NOTE — H&P (Signed)
?  ?Physical Medicine and Rehabilitation Admission H&P ?  ?  ?   ?Chief Complaint  ?Patient presents with  ? Stroke with functional deficits.   ?  ?  ?HPI: Patrick Rose. Marland is a 71 year old male with history of CAF- on ASA, aortic aneurysm who was admitted via [place] on 12/03/21 with acute onset of left hemiplegia with right gaze deviation and found to have hyperdense R-MCA sign. UDS negative. He received TNK and CTA head/neck done revealing acute occlusion of R-MCA likely due to embolus and small A1 segment with chronic occlusion and diffuse atherosclerotic disease distally. He underwent cerebral angio with complete revascularization of R-MCA M1 occlusion. Post procedure with drainage/oozing from right groin with diffuse ecchymosis.  ?  ?Follow up MRI brain done revealing large acute cortical/subcortical R-MCA territory infract with mild petechial hemorrhage within right basal ganglia, partial effacement of right lateral ventricle and 1-2 mm leftward midline shift with moderate size acute cortical infarct within right  frontoparietal lobes as well as chronic 3.6 cm right cerebellar infarct. 2D echo done revealing EF 55-60% with moderate concentric LVH and aortic sclerosis without stenosis and borderline dilatation of ascending aorta. He was extubated without difficulty and kept NPO due to decreased alertness and wet voice with delayed cough.   ?  ?Serial CT head done for monitoring and most recent 04/04 showing no progression in R-MCA or distal ACA infarcts, cytotoxic edema with 4 mm midline shift and no hemorrhagic conversion.  He was maintained on ASA and transitioned to Eliquis today. Diet has been advanced to D2 w/nectar liquids. He has had issues with sleep wake disruption but lethargy resolving and po intake has been good. Patient with resultant dense left spastic hemiplegia with right gaze preference, decreased awareness of deficits and distractibility. Therapy has been working on balance at EOB due to  posterior lean and pusher tendencies. CIR recommended due to functional decline.   ?  ?  ?Review of Systems  ?HENT: Negative.    ?Eyes:  Negative for blurred vision and double vision.  ?Respiratory:  Positive for cough (a little today).   ?Cardiovascular:  Negative for chest pain and palpitations.  ?Gastrointestinal:  Positive for constipation (goes every other day--none since admission.). Negative for heartburn and nausea.  ?Genitourinary:  Negative for dysuria and urgency.  ?Musculoskeletal:  Positive for joint pain (left hip pain) and myalgias (buttocks).  ?Skin:  Negative for rash.  ?Neurological:  Positive for sensory change (numbness bilateral hand due to cervical myelopathy) and weakness. Negative for dizziness and headaches.  ?  ?  ?    ?Past Medical History:  ?Diagnosis Date  ? Afib ([place])    ? Cervical myelopathy ([place])    ? Lumbar stenosis 09/2021  ?  with pain radiatiing down buttocks to thighs  ?  ?  ?     ?Past Surgical History:  ?Procedure Laterality Date  ? ANTERIOR CERVICAL DECOMP/DISCECTOMY FUSION   2008  ? APPENDECTOMY      ? BACK SURGERY      ? IR CT HEAD LTD   12/03/2021  ? IR PERCUTANEOUS ART THROMBECTOMY/INFUSION INTRACRANIAL INC DIAG ANGIO   12/03/2021  ? RADIOLOGY WITH ANESTHESIA N/A 12/03/2021  ?  Procedure: IR WITH ANESTHESIA;  Surgeon: Luanne Bras, MD;  Location: [place];  Service: Radiology;  Laterality: N/A;  ?  ?  ?     ?Family History  ?Problem Relation Age of Onset  ? Cancer Mother    ? Cancer Father    ?  Cancer Sister    ? Cancer Brother    ?  ?  ?Social History:  Married. 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 Active--walks 2-3  miles per day and bikes long distances. He smoked briefly as teenager and used chew for 1-2 years. He drink whiskey couple of times a week. He does not use any illicit drugs.  ?  ?  ?    ?Allergies  ?Allergen Reactions  ? Amoxicillin-Pot Clavulanate Rash  ? Penicillins Hives and Rash  ?  ?  ?      ?Medications Prior to Admission  ?Medication Sig Dispense  Refill  ? Ascorbic Acid (VITAMIN C PO) Take 1 tablet by mouth at bedtime.      ? aspirin EC 81 MG tablet Take 81 mg by mouth at bedtime. Swallow whole.      ? MELATONIN PO Take 1 capsule by mouth at bedtime as needed (sleep).      ? Multiple Vitamin (MULTIVITAMIN) tablet Take 1 tablet by mouth at bedtime.      ? Turmeric (QC TUMERIC COMPLEX PO) Take 1 capsule by mouth at bedtime.      ? VITAMIN D PO Take 1 tablet by mouth at bedtime.      ?  ?  ?  ?  ?Home: ?Home Living ?Family/patient expects to be discharged to:: Inpatient rehab ?Living Arrangements: Spouse/significant other ?Available Help at Discharge: Family, Available 24 hours/day ?Type of Home: House ?Home Access: Stairs to enter ?Entrance Stairs-Number of Steps: 8 ?Entrance Stairs-Rails: Right, Left ?Home Layout: Laundry or work area in basement ?Bathroom Shower/Tub: Walk-in shower ?Bathroom Toilet: Standard ?Bathroom Accessibility: Yes ?Home Equipment: None ? Lives With: Spouse ?  ?Functional History: ?Prior Function ?Prior Level of Function : Independent/Modified Independent ?Mobility Comments: Pt ambulated without assistive device. He reports he had lumbar surgery in 1/23 and has been limited activity wise since surgery.  Typically he enjoys bicycling, and mowing grass on his tractor ?ADLs Comments: pt reports he was independent.  He is retired from [employer] jobs ?  ?Functional Status:  ?Mobility: ?Bed Mobility ?Overal bed mobility: Needs Assistance ?Bed Mobility: Supine to Sit, Sit to Supine ?Supine to sit: Max assist, +2 for physical assistance ?Sit to supine: Total assist, +2 for physical assistance ?General bed mobility comments: Pt. assisting while being cued for sequencing, total for all L sided movement and trunk support. He was able to push up from the R side when propped on his elbow. Required increased A at the end of the session secondary to fatigue. ?Transfers ?Overall transfer level: Needs assistance ?Equipment used: 2 person hand held  assist ?Transfers: Sit to/from Stand ?Sit to Stand: Max assist, +2 physical assistance, +2 safety/equipment ?General transfer comment: Pt unsafe to attempt standing - focused on EOB balance ?Ambulation/Gait ?General Gait Details: unable ?  ?ADL: ?ADL ?Overall ADL's : Needs assistance/impaired ?Eating/Feeding: Bed level, Maximal assistance ?Eating/Feeding Details (indicate cue type and reason): demonstrates appropriate use of utensils ?Grooming: Oral care, Minimal assistance, Bed level ?Upper Body Bathing: Maximal assistance, Sitting ?Lower Body Bathing: Maximal assistance, Sit to/from stand, Bed level ?Upper Body Dressing : Maximal assistance, Sitting ?Lower Body Dressing: Total assistance, Bed level, Sit to/from stand ?Toilet Transfer: Total assistance ?Toilet Transfer Details (indicate cue type and reason): unable to safely attempt this date ?Toileting- Clothing Manipulation and Hygiene: Total assistance, Sit to/from stand, Bed level ?Functional mobility during ADLs: Maximal assistance, +2 for safety/equipment, Cueing for safety, Cueing for sequencing ?General ADL Comments: session focus on decreasing flexor tone, increasing activity  tolerance, and command following ?  ?Cognition: ?Cognition ?Overall Cognitive Status: Impaired/Different from baseline ?Arousal/Alertness: Awake/alert ?Orientation Level: Oriented X4 ?Attention: Sustained, Selective ?Sustained Attention: Impaired ?Sustained Attention Impairment: Verbal basic, Functional basic ?Selective Attention: Impaired ?Selective Attention Impairment: Verbal basic, Functional basic ?Memory: Impaired ?Memory Impairment: Storage deficit, Decreased recall of new information ?Awareness: Impaired ?Awareness Impairment: Intellectual impairment ?Behaviors: Perseveration ?Safety/Judgment: Impaired ?Cognition ?Arousal/Alertness: Awake/alert ?Behavior During Therapy: [facility] for tasks assessed/performed ?Overall Cognitive Status: Impaired/Different from baseline ?Area of  Impairment: Attention, Memory, Following commands, Safety/judgement, Awareness, Problem solving ?Orientation Level: Disoriented to (Mod I locating calendar in room) ?Current Attention Level: Focused ?Memory: Tennis Must

## 2021-12-08 NOTE — Progress Notes (Signed)
ANTICOAGULATION CONSULT NOTE - Initial Consult ? ?Pharmacy Consult for apixaban ?Indication: atrial fibrillation and stroke ? ?Allergies  ?Allergen Reactions  ? Amoxicillin-Pot Clavulanate Rash  ? Penicillins Hives and Rash  ? ? ?Patient Measurements: ?Height: 6\' 1"  (185.4 cm) ?Weight: 96.4 kg (212 lb 8.4 oz) ?IBW/kg (Calculated) : 79.9 ? ? ?Vital Signs: ?Temp: 98.3 ?F (36.8 ?C) (04/04 0746) ?Temp Source: Oral (04/04 0400) ?BP: 119/64 (04/04 0746) ?Pulse Rate: 89 (04/04 0746) ? ?Labs: ?Recent Labs  ?  12/06/21 ?0258 12/07/21 ?0307  ?HGB 11.3* 12.2*  ?HCT 34.4* 36.0*  ?PLT 165 179  ?CREATININE 0.99 0.77  ? ? ?Estimated Creatinine Clearance: 103.6 mL/min (by C-G formula based on SCr of 0.77 mg/dL). ? ? ?Medical History: ?Past Medical History:  ?Diagnosis Date  ? Afib ([place])   ? ? ?Medications:  ?Medications Prior to Admission  ?Medication Sig Dispense Refill Last Dose  ? Ascorbic Acid (VITAMIN C PO) Take 1 tablet by mouth at bedtime.   Past Week  ? aspirin EC 81 MG tablet Take 81 mg by mouth at bedtime. Swallow whole.   Past Week  ? MELATONIN PO Take 1 capsule by mouth at bedtime as needed (sleep).   Past Month  ? Multiple Vitamin (MULTIVITAMIN) tablet Take 1 tablet by mouth at bedtime.   Past Week  ? Turmeric (QC TUMERIC COMPLEX PO) Take 1 capsule by mouth at bedtime.   Past Week  ? VITAMIN D PO Take 1 tablet by mouth at bedtime.   Past Week  ? ? ?Assessment: ?71 y.o. male with history of Afib not on anticoagulation (chadsvasc 1 prior cva) but daily asa 52, admitted with acute stroke. ?Has been getting aspirin 325 mg daily.  ?Pharmacy consulted to start apixaban. ? ?Goal of Therapy:  ?Therapeutic anticoagulation ?Monitor platelets by anticoagulation protocol: Yes ?  ?Plan:  ?Start apixaban 5 mg PO BID ?Monitor for signs and symptoms of bleeding ?Discontinue aspirin ? ? ?Thank you for allowing [place] to participate in this patients care. ?Jens Som, PharmD ?12/08/2021 8:51 AM ? ?**Pharmacist phone directory can be  found on [email or web address] listed under [place]** ? ? ? ?

## 2021-12-08 NOTE — Discharge Summary (Addendum)
Stroke Discharge Summary  ?Patient ID: Patrick Rose    l ?  MRN: [id number]    ?  DOB: [birth year] ? ?Date of Admission: 12/03/2021 ?Date of Discharge: 12/08/2021 ? ?Attending Physician:  Stroke, Md, MD, Stroke MD ?Consultant(s):   rehabilitation medicine ?[name] PCP:  Pcp, No ? ?Discharge Diagnoses:  Right MCA branch infarct due to M1/MCA occlusion s/p tNK and mechanical thrombectomy with TICI 3 revascularization. Etiology presumably cardio-embolic source in the setting of afib not on anticoagulation due to low CHA2DS2-VASc score of 1 ?Principal Problem: ?  Acute ischemic right MCA stroke ([place]) ?Active Problems: ?  Middle cerebral artery embolism, right ?Left hemiplegia ?Left hemineglect ?Hyperlipidemia ? ? ?Medications to be continued on Rehab ?Allergies as of 12/08/2021   ? ?   Reactions  ? Amoxicillin-pot Clavulanate Rash  ? Penicillins Hives, Rash  ? ?  ? ?  ?Medication List  ?  ? ?STOP taking these medications   ? ?aspirin EC 81 MG tablet ?  ?MELATONIN PO ?  ?multivitamin tablet ?  ?QC TUMERIC COMPLEX PO ?  ?VITAMIN C PO ?  ?VITAMIN D PO ?  ? ?  ? ?TAKE these medications   ? ?apixaban 5 MG Tabs tablet ?Commonly known as: ELIQUIS ?Take 1 tablet (5 mg total) by mouth 2 (two) times daily. ?  ?rosuvastatin 20 MG tablet ?Commonly known as: CRESTOR ?Take 1 tablet (20 mg total) by mouth at bedtime. ?  ? ?  ? ? ?LABORATORY STUDIES ?CBC ?   ?Component Value Date/Time  ? WBC 9.9 12/07/2021 0307  ? RBC 4.13 (L) 12/07/2021 0307  ? HGB 12.2 (L) 12/07/2021 0307  ? HCT 36.0 (L) 12/07/2021 0307  ? PLT 179 12/07/2021 0307  ? MCV 87.2 12/07/2021 0307  ? MCH 29.5 12/07/2021 0307  ? MCHC 33.9 12/07/2021 0307  ? RDW 12.4 12/07/2021 0307  ? LYMPHSABS 0.6 (L) 12/04/2021 0400  ? MONOABS 0.2 12/04/2021 0400  ? EOSABS 0.0 12/04/2021 0400  ? BASOSABS 0.0 12/04/2021 0400  ? ?CMP ?   ?Component Value Date/Time  ? NA 139 12/07/2021 0307  ? K 4.0 12/07/2021 0307  ? CL 108 12/07/2021 0307  ? CO2 23 12/07/2021 0307  ? GLUCOSE 114 (H) 12/07/2021  0307  ? BUN 16 12/07/2021 0307  ? CREATININE 0.77 12/07/2021 0307  ? CALCIUM 8.9 12/07/2021 0307  ? PROT 6.2 (L) 12/03/2021 1835  ? ALBUMIN 4.0 12/03/2021 1835  ? AST 28 12/03/2021 1835  ? ALT 24 12/03/2021 1835  ? ALKPHOS 67 12/03/2021 1835  ? BILITOT 0.9 12/03/2021 1835  ? GFRNONAA >60 12/07/2021 0307  ? ?COAGS ?Lab Results  ?Component Value Date  ? INR 1.2 12/03/2021  ? ?Lipid Panel ?   ?Component Value Date/Time  ? CHOL 172 12/04/2021 0650  ? TRIG 45 12/04/2021 0650  ? TRIG 43 12/04/2021 0650  ? HDL 48 12/04/2021 0650  ? CHOLHDL 3.6 12/04/2021 0650  ? VLDL 9 12/04/2021 0650  ? [place] 115 (H) 12/04/2021 0650  ? ?HgbA1C  ?Lab Results  ?Component Value Date  ? HGBA1C 5.4 12/04/2021  ? ?Urinalysis ?   ?Component Value Date/Time  ? [place] YELLOW 12/05/2021 0853  ? APPEARANCEUR HAZY (A) 12/05/2021 0853  ? LABSPEC 1.025 12/05/2021 0853  ? PHURINE 5.0 12/05/2021 0853  ? [place] NEGATIVE 12/05/2021 0853  ? [place] NEGATIVE 12/05/2021 0853  ? [place] NEGATIVE 12/05/2021 0853  ? [place] NEGATIVE 12/05/2021 0853  ? [place] NEGATIVE 12/05/2021 0853  ? NITRITE NEGATIVE 12/05/2021 0853  ?  LEUKOCYTESUR NEGATIVE 12/05/2021 0853  ? ?Urine Drug Screen  ?   ?Component Value Date/Time  ? LABOPIA NONE DETECTED 12/04/2021 0500  ? COCAINSCRNUR NONE DETECTED 12/04/2021 0500  ? LABBENZ NONE DETECTED 12/04/2021 0500  ? AMPHETMU NONE DETECTED 12/04/2021 0500  ? THCU NONE DETECTED 12/04/2021 0500  ? LABBARB NONE DETECTED 12/04/2021 0500  ?  ?Alcohol Level ?No results found for: ETH ? ? ?SIGNIFICANT DIAGNOSTIC STUDIES ?CT HEAD WO CONTRAST (5MM) ? ?Result Date: 12/08/2021 ?CLINICAL DATA:  Acute right MCA stroke, follow-up. EXAM: CT HEAD WITHOUT CONTRAST TECHNIQUE: Contiguous axial images were obtained from the base of the skull through the vertex without intravenous contrast. RADIATION DOSE REDUCTION: This exam was performed according to the departmental dose-optimization program which includes automated exposure control, adjustment  of the mA and/or kV according to patient size and/or use of iterative reconstruction technique. COMPARISON:  Two days ago FINDINGS: Brain: Extensive cytotoxic edema in the right MCA and distal ACA territory. Local mass effect with 4 mm midline shift. No herniation. No hemorrhage or hydrocephalus. Vascular: No interval abnormality. Skull: Normal. Negative for fracture or focal lesion. Sinuses/Orbits: No acute finding. IMPRESSION: 1. No detected progression of right MCA and distal ACA territory infarcts. No hemorrhagic conversion. 2. Cytotoxic edema causes 4 mm of midline shift. Electronically Signed   By: Jorje Guild M.D.   On: 12/08/2021 07:14  ? ?CT HEAD WO CONTRAST (5MM) ? ?Result Date: 12/06/2021 ?CLINICAL DATA:  Stroke follow-up. EXAM: CT HEAD WITHOUT CONTRAST TECHNIQUE: Contiguous axial images were obtained from the base of the skull through the vertex without intravenous contrast. RADIATION DOSE REDUCTION: This exam was performed according to the departmental dose-optimization program which includes automated exposure control, adjustment of the mA and/or kV according to patient size and/or use of iterative reconstruction technique. COMPARISON:  MR head 12/04/2021; CT head 12/03/2021 FINDINGS: Brain: Extensive right MCA territory infarct and moderate right ACA territory infarct, stable compared to 12/04/2021 MR head. Unchanged mass effect with near complete effacement of the frontal horn of the right lateral ventricle. There is approximately 3 mm of leftward midline shift, similar to most recent prior exam. No interval intracranial hemorrhage, herniation, extra-axial fluid collection or hydrocephalus. No definite correlate for the T2 FLAIR hyperintense signal abnormality within the superior right cerebellar hemisphere seen on MR head 12/04/2021. Vascular: No hyperdense vessel or unexpected calcification. Skull: Normal. Negative for fracture or focal lesion. Sinuses/Orbits: No acute finding. Other: None.  IMPRESSION: Extensive right MCA territory infarct and moderate right ACA territory infarct are stable compared to 12/04/2021. Unchanged mild leftward midline shift. No interval intracranial hemorrhage, herniation, or hydrocephalus. Electronically Signed   By: Ileana Roup M.D.   On: 12/06/2021 17:27  ? ?MR BRAIN WO CONTRAST ? ?Result Date: 12/04/2021 ?CLINICAL DATA:  Provided history: Neuro deficit, acute, stroke suspected. Right M1 occlusion status post IR. EXAM: MRI HEAD WITHOUT CONTRAST TECHNIQUE: Multiplanar, multiecho pulse sequences of the brain and surrounding structures were obtained without intravenous contrast. COMPARISON:  CT angiogram head/neck and non-contrast head CT 12/03/2021. FINDINGS: Mild intermittent motion degradation. Brain: No age advanced or lobar predominant atrophy. Large acute cortical/subcortical infarct within the right MCA territory, within the right frontal lobe, right parietal lobe, right temporal lobe, right insula/subinsular region, right basal ganglia, anterior limb of right internal capsule and right corona radiata. Associated mild petechial hemorrhage within the right basal ganglia. Mass effect with partial effacement of the right lateral ventricle and 1-2 mm leftward midline shift. Moderate-sized acute cortically-based infarct within the medial  right frontoparietal lobes (right ACA vascular territory). Minimal multifocal T2 FLAIR hyperintense signal abnormality elsewhere within the cerebral white matter, nonspecific but compatible with chronic small vessel ischemic disease. 3.6 cm focus of T2 FLAIR hyperintense signal abnormality within the superior right cerebellar hemisphere, with an appearance most suggestive of a chronic infarct. Punctate chronic microhemorrhage within the left cerebellar hemisphere (series 14, image 17). No chronic intracranial blood products. No extra-axial fluid collection. Vascular: Maintained flow voids within the proximal large arterial vessels. Skull  and upper cervical spine: No focal suspicious marrow lesion. Sinuses/Orbits: Visualized orbits show no acute finding. Minimal mucosal thickening within the bilateral ethmoid sinuses. Other: Trace fluid wit

## 2021-12-08 NOTE — TOC Benefit Eligibility Note (Signed)
Patient Advocate Encounter ? ?Insurance verification completed.   ? ?The patient is currently admitted and upon discharge could be taking Eliquis 5 mg. ? ?The current 30 day co-pay is, $47.00.  ? ? ?The patient is currently admitted and upon discharge could be taking rosuvastatin (Crestor) 20 mg. ? ?The current 30 day co-pay is, $15.00.  ? ?The patient is insured through [employer] Part D  ? ? ? ?Roland Earl, CPhT ?Pharmacy Patient Advocate Specialist ?[facility] Patient Advocate Team ?Direct Number: [phone]  Fax: [phone] ? ? ? ? ? ?  ?

## 2021-12-08 NOTE — Progress Notes (Signed)
Inpatient Rehabilitation Admissions Coordinator  ? ?I met with patient and his wife at bedside. I reviewed cost of care for Cir . I await [facility] approval for possible Cir admit. ? ?Danne Baxter, RN, MSN ?Rehab Admissions Coordinator ?[phone] ?12/08/2021 12:22 PM ? ?

## 2021-12-08 NOTE — Progress Notes (Signed)
Physical Therapy Treatment ?Patient Details ?Name: Patrick Rose ?MRN: [id number] ?DOB: October 05, 1950 ?Today's Date: 12/08/2021 ? ? ?History of Present Illness Pt. is a 71 y.o. M presenting to [facility] on 3/30 with left hemiplegia, right gaze deviation, and slurred speech. Head CT shows hyperdense R MCA and 2/2 lethargy. He recieved TNK on 3/30.  PMH significant for a fib and stable aortic aneurysm. ? ?  ?PT Comments  ? ? Focus of session was bed mobility and EOB balance. Pt. shows posterior lean and continues to push towards to L side. He shows awareness the skewed sense of vertical but still has limited ability to correct without cuing. He showed improvement while using a mirror as an exernal focus. He required constant cuing to maintain attention towards the mirror but showed an ability to correct his balance and sit upright with min-mod A from therapist support. He was able to tolerate midline sitting for :30-1:00 before fatiging out. He requires constant cues for the placement of his R hand on his lap. (May do well with a tapeline on mirror to improve midline external focus).  Pt. Shows some improvement with his sitting balance, sense of upright, and ability to follow directions. Overall attention level L sided hemiplegia, balance, and functional ability are still limiting function. Pt. Would benefit from skilled PT to continue to address his bed mobility, transfer ability, sitting balance, and L UE/Le function. Plan and discharge setting remains unchanged. Pt to follow acutely as appropriate.  ?   ?Recommendations for follow up therapy are one component of a multi-disciplinary discharge planning process, led by the attending physician.  Recommendations may be updated based on patient status, additional functional criteria and insurance authorization. ? ?Follow Up Recommendations ? Acute inpatient rehab (3hours/day) ?  ?  ?Assistance Recommended at Discharge Frequent or constant Supervision/Assistance  ?Patient can return  home with the following Two people to help with walking and/or transfers;Two people to help with bathing/dressing/bathroom;Assistance with cooking/housework;Help with stairs or ramp for entrance;Assist for transportation;Direct supervision/assist for financial management;Direct supervision/assist for medications management ?  ?Equipment Recommendations ? BSC/3in1;Wheelchair (measurements PT);Wheelchair cushion (measurements PT);Rolling walker (2 wheels);Hospital bed;Other (comment) (hoyer)  ?  ?Recommendations for Other Services Rehab consult ? ? ?  ?Precautions / Restrictions Precautions ?Precautions: Fall ?Precaution Comments: pushing to L; L neglect ?Restrictions ?Weight Bearing Restrictions: No  ?  ? ?Mobility ? Bed Mobility ?Overal bed mobility: Needs Assistance ?Bed Mobility: Supine to Sit, Sit to Supine ?  ?  ?Supine to sit: Max assist, +2 for physical assistance ?Sit to supine: Total assist, +2 for physical assistance ?  ?General bed mobility comments: Pt. assisting while being cued for sequencing, total for all L sided movement and trunk support. He was able to push up from the R side when propped on his elbow. Required increased A at the end of the session secondary to fatigue. ?  ? ?Transfers ?  ?  ?  ?  ?  ?  ?  ?  ?  ?General transfer comment: Pt unsafe to attempt standing - focused on EOB balance ?  ? ? ? ?Modified Rankin (Stroke Patients Only) ?Modified Rankin (Stroke Patients Only) ?Pre-Morbid Rankin Score: No symptoms ?Modified Rankin: Severe disability ? ? ?  ?Balance Overall balance assessment: Needs assistance ?Sitting-balance support: Single extremity supported, Bilateral upper extremity supported, Feet supported ?Sitting balance-Leahy Scale: Poor ?Sitting balance - Comments: Pt was able to maintain sitting balance with min-mod A throughout. Still has skewed sense of upright that he was  aware of at times. Showed improved control with mirror for external focus. Still with significant posterior  lean and left sided pushing. Required constant cuing to maintain balance. ?Postural control: Posterior lean, Left lateral lean ?  ?Standing balance-Leahy Scale: Zero ?  ?  ?  ?  ?  ?  ?  ?  ?  ?  ?  ?  ?  ? ?  ?Cognition Arousal/Alertness: Awake/alert ?Behavior During Therapy: [facility] for tasks assessed/performed ?Overall Cognitive Status: Impaired/Different from baseline ?Area of Impairment: Attention, Memory, Following commands, Safety/judgement, Awareness, Problem solving ?  ?  ?  ?  ?  ?  ?  ?  ?  ?Current Attention Level: Focused ?Memory: Decreased short-term memory ?Following Commands: Follows one step commands consistently, Follows multi-step commands inconsistently ?Safety/Judgement: Decreased awareness of deficits, Decreased awareness of safety ?Awareness: Emergent ?Problem Solving: Slow processing, Difficulty sequencing, Requires verbal cues, Requires tactile cues ?General Comments: Pt. plesant and talkative throughout session, easily distracted and requires constant cuing to maintain task. Significant L neglect and skewed sense of upright. Requires constant cues for sequencing. ?  ?  ? ?  ?Exercises Other Exercises ?Other Exercises: Truncal rotations reaching out of midline: PT was able to take the L UE with the R UE and reach minimally out of BOS to SPT hands and return to upright with mod A posterior support. 5x each side ? ?  ?General Comments General comments (skin integrity, edema, etc.): VSS on RA ?  ?  ? ?Pertinent Vitals/Pain Pain Assessment ?Faces Pain Scale: Hurts a little bit ?Pain Location: L hip ?Pain Descriptors / Indicators: Sore ?Pain Intervention(s): Limited activity within patient's tolerance, Monitored during session, Repositioned  ? ? ? ?PT Goals (current goals can now be found in the care plan section) Acute Rehab PT Goals ?Patient Stated Goal: agreeable to session, family wants pt to return to his baseline ?PT Goal Formulation: With patient/family ?Time For Goal Achievement:  12/19/21 ?Potential to Achieve Goals: Fair ?Progress towards PT goals: Progressing toward goals ? ?  ?Frequency ? ? ? Min 4X/week ? ? ? ?  ?PT Plan Current plan remains appropriate  ? ? ?   ?AM-PAC PT "6 Clicks" Mobility   ?Outcome Measure ? Help needed turning from your back to your side while in a flat bed without using bedrails?: A Lot ?Help needed moving from lying on your back to sitting on the side of a flat bed without using bedrails?: Total ?Help needed moving to and from a bed to a chair (including a wheelchair)?: Total ?Help needed standing up from a chair using your arms (e.g., wheelchair or bedside chair)?: Total ?Help needed to walk in hospital room?: Total ?Help needed climbing 3-5 steps with a railing? : Total ?6 Click Score: 7 ? ?  ?End of Session   ?Activity Tolerance: Patient tolerated treatment well ?Patient left: in bed;with call bell/phone within reach;with bed alarm set;with family/visitor present ?Nurse Communication: Mobility status ?PT Visit Diagnosis: Unsteadiness on feet (R26.81);Other abnormalities of gait and mobility (R26.89);Muscle weakness (generalized) (M62.81);Difficulty in walking, not elsewhere classified (R26.2);Apraxia (R48.2);Other symptoms and signs involving the nervous system (R29.898);Hemiplegia and hemiparesis ?Hemiplegia - Right/Left: Left ?Hemiplegia - dominant/non-dominant: Non-dominant ?Hemiplegia - caused by: Cerebral infarction ?  ? ? ?Time: [id number] ?PT Time Calculation (min) (ACUTE ONLY): 32 min ? ?Charges:  $Therapeutic Activity: 8-22 mins ?$Neuromuscular Re-education: 8-22 mins          ?          ? ?Thermon Leyland,  SPT ?Acute Rehab Services ? ? ? ?Thermon Leyland ?12/08/2021, 1:15 PM ? ?

## 2021-12-08 NOTE — TOC Transition Note (Signed)
Transition of Care (TOC) - CM/SW Discharge Note ? ? ?Patient Details  ?Name: Patrick Rose ?MRN: [id number] ?Date of Birth: [birth year] ? ?Transition of Care (TOC) CM/SW Contact:  ?Pollie Friar, RN ?Phone Number: ?12/08/2021, 3:12 PM ? ? ?Clinical Narrative:    ?Patient discharging to CIR today. CM signing off. ? ? ?Final next level of care: [place] ?Barriers to Discharge: No Barriers Identified ? ? ?Patient Goals and CMS Choice ?  ?  ?Choice offered to / list presented to : Patient ? ?Discharge Placement ?  ?           ?  ?  ?  ?  ? ?Discharge Plan and Services ?  ?  ?           ?  ?  ?  ?  ?  ?  ?  ?  ?  ?  ? ?Social Determinants of Health (SDOH) Interventions ?  ? ? ?Readmission Risk Interventions ?   ? View : No data to display.  ?  ?  ?  ? ? ? ? ? ?

## 2021-12-08 NOTE — H&P (Signed)
? ? ?Physical Medicine and Rehabilitation Admission H&P ? ?  ?Chief Complaint  ?Patient presents with  ? Stroke with functional deficits.   ? ? ?HPI: Patrick Rose. Mention is a 71 year old male with history of CAF- on ASA, aortic aneurysm who was admitted via [facility] on 12/03/21 with acute onset of left hemiplegia with right gaze deviation and found to have hyperdense R-MCA sign. UDS negative. He received TNK and CTA head/neck done revealing acute occlusion of R-MCA likely due to embolus and small A1 segment with chronic occlusion and diffuse atherosclerotic disease distally. He underwent cerebral angio with complete revascularization of R-MCA M1 occlusion. Post procedure with drainage/oozing from right groin with diffuse ecchymosis.  ? ?Follow up MRI brain done revealing large acute cortical/subcortical R-MCA territory infract with mild petechial hemorrhage within right basal ganglia, partial effacement of right lateral ventricle and 1-2 mm leftward midline shift with moderate size acute cortical infarct within right  frontoparietal lobes as well as chronic 3.6 cm right cerebellar infarct. 2D echo done revealing EF 55-60% with moderate concentric LVH and aortic sclerosis without stenosis and borderline dilatation of ascending aorta. He was extubated without difficulty and kept NPO due to decreased alertness and wet voice with delayed cough.   ? ?Serial CT head done for monitoring and most recent 04/04 showing no progression in R-MCA or distal ACA infarcts, cytotoxic edema with 4 mm midline shift and no hemorrhagic conversion.  He was maintained on ASA and transitioned to Eliquis today. Diet has been advanced to D2 w/nectar liquids. He has had issues with sleep wake disruption but lethargy resolving and po intake has been good. Patient with resultant dense left spastic hemiplegia with right gaze preference, decreased awareness of deficits and distractibility. Therapy has been working on balance at EOB due to posterior  lean and pusher tendencies. CIR recommended due to functional decline.   ? ? ?Review of Systems  ?HENT: Negative.    ?Eyes:  Negative for blurred vision and double vision.  ?Respiratory:  Positive for cough (a little today).   ?Cardiovascular:  Negative for chest pain and palpitations.  ?Gastrointestinal:  Positive for constipation (goes every other day--none since admission.). Negative for heartburn and nausea.  ?Genitourinary:  Negative for dysuria and urgency.  ?Musculoskeletal:  Positive for joint pain (left hip pain) and myalgias (buttocks).  ?Skin:  Negative for rash.  ?Neurological:  Positive for sensory change (numbness bilateral hand due to cervical myelopathy) and weakness. Negative for dizziness and headaches.  ? ? ?Past Medical History:  ?Diagnosis Date  ? Afib ([place])   ? Cervical myelopathy ([place])   ? Lumbar stenosis 09/2021  ? with pain radiatiing down buttocks to thighs  ? ? ?Past Surgical History:  ?Procedure Laterality Date  ? ANTERIOR CERVICAL DECOMP/DISCECTOMY FUSION  2008  ? APPENDECTOMY    ? BACK SURGERY    ? IR CT HEAD LTD  12/03/2021  ? IR PERCUTANEOUS ART THROMBECTOMY/INFUSION INTRACRANIAL INC DIAG ANGIO  12/03/2021  ? RADIOLOGY WITH ANESTHESIA N/A 12/03/2021  ? Procedure: IR WITH ANESTHESIA;  Surgeon: Luanne Bras, MD;  Location: [facility];  Service: Radiology;  Laterality: N/A;  ? ? ?Family History  ?Problem Relation Age of Onset  ? Cancer Mother   ? Cancer Father   ? Cancer Sister   ? Cancer Brother   ? ? ?Social History:  Married. 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 Active--walks 2-3  miles per day and bikes long distances. He smoked briefly as teenager and used chew for 1-2 years.  He drink whiskey couple of times a week. He does not use any illicit drugs.  ? ? ?Allergies  ?Allergen Reactions  ? Amoxicillin-Pot Clavulanate Rash  ? Penicillins Hives and Rash  ? ? ?Medications Prior to Admission  ?Medication Sig Dispense Refill  ? Ascorbic Acid (VITAMIN C PO) Take 1 tablet by mouth at  bedtime.    ? aspirin EC 81 MG tablet Take 81 mg by mouth at bedtime. Swallow whole.    ? MELATONIN PO Take 1 capsule by mouth at bedtime as needed (sleep).    ? Multiple Vitamin (MULTIVITAMIN) tablet Take 1 tablet by mouth at bedtime.    ? Turmeric (QC TUMERIC COMPLEX PO) Take 1 capsule by mouth at bedtime.    ? VITAMIN D PO Take 1 tablet by mouth at bedtime.    ? ? ? ? ?Home: ?Home Living ?Family/patient expects to be discharged to:: Inpatient rehab ?Living Arrangements: Spouse/significant other ?Available Help at Discharge: Family, Available 24 hours/day ?Type of Home: House ?Home Access: Stairs to enter ?Entrance Stairs-Number of Steps: 8 ?Entrance Stairs-Rails: Right, Left ?Home Layout: Laundry or work area in basement ?Bathroom Shower/Tub: Walk-in shower ?Bathroom Toilet: Standard ?Bathroom Accessibility: Yes ?Home Equipment: None ? Lives With: Spouse ?  ?Functional History: ?Prior Function ?Prior Level of Function : Independent/Modified Independent ?Mobility Comments: Pt ambulated without assistive device. He reports he had lumbar surgery in 1/23 and has been limited activity wise since surgery.  Typically he enjoys bicycling, and mowing grass on his tractor ?ADLs Comments: pt reports he was independent.  He is retired from [employer] jobs ? ?Functional Status:  ?Mobility: ?Bed Mobility ?Overal bed mobility: Needs Assistance ?Bed Mobility: Supine to Sit, Sit to Supine ?Supine to sit: Max assist, +2 for physical assistance ?Sit to supine: Total assist, +2 for physical assistance ?General bed mobility comments: Pt. assisting while being cued for sequencing, total for all L sided movement and trunk support. He was able to push up from the R side when propped on his elbow. Required increased A at the end of the session secondary to fatigue. ?Transfers ?Overall transfer level: Needs assistance ?Equipment used: 2 person hand held assist ?Transfers: Sit to/from Stand ?Sit to Stand: Max assist, +2 physical  assistance, +2 safety/equipment ?General transfer comment: Pt unsafe to attempt standing - focused on EOB balance ?Ambulation/Gait ?General Gait Details: unable ?  ? ?ADL: ?ADL ?Overall ADL's : Needs assistance/impaired ?Eating/Feeding: Bed level, Maximal assistance ?Eating/Feeding Details (indicate cue type and reason): demonstrates appropriate use of utensils ?Grooming: Oral care, Minimal assistance, Bed level ?Upper Body Bathing: Maximal assistance, Sitting ?Lower Body Bathing: Maximal assistance, Sit to/from stand, Bed level ?Upper Body Dressing : Maximal assistance, Sitting ?Lower Body Dressing: Total assistance, Bed level, Sit to/from stand ?Toilet Transfer: Total assistance ?Toilet Transfer Details (indicate cue type and reason): unable to safely attempt this date ?Toileting- Clothing Manipulation and Hygiene: Total assistance, Sit to/from stand, Bed level ?Functional mobility during ADLs: Maximal assistance, +2 for safety/equipment, Cueing for safety, Cueing for sequencing ?General ADL Comments: session focus on decreasing flexor tone, increasing activity tolerance, and command following ? ?Cognition: ?Cognition ?Overall Cognitive Status: Impaired/Different from baseline ?Arousal/Alertness: Awake/alert ?Orientation Level: Oriented X4 ?Attention: Sustained, Selective ?Sustained Attention: Impaired ?Sustained Attention Impairment: Verbal basic, Functional basic ?Selective Attention: Impaired ?Selective Attention Impairment: Verbal basic, Functional basic ?Memory: Impaired ?Memory Impairment: Storage deficit, Decreased recall of new information ?Awareness: Impaired ?Awareness Impairment: Intellectual impairment ?Behaviors: Perseveration ?Safety/Judgment: Impaired ?Cognition ?Arousal/Alertness: Awake/alert ?Behavior During Therapy: [facility] for tasks assessed/performed ?  Overall Cognitive Status: Impaired/Different from baseline ?Area of Impairment: Attention, Memory, Following commands, Safety/judgement, Awareness,  Problem solving ?Orientation Level: Disoriented to (Mod I locating calendar in room) ?Current Attention Level: Focused ?Memory: Decreased short-term memory ?Following Commands: Follows one step commands consistent

## 2021-12-08 NOTE — Progress Notes (Signed)
?  Inpatient Rehabilitation Admissions Coordinator  ? ?I have insurance approval and Cir bed to admit him to today., I contacted Dr Pearlean Brownie, acute team and TOC. I wail make the arrangements to admit today. ? ?Ottie Glazier, RN, MSN ?Rehab Admissions Coordinator ?([phone] ?12/08/2021 2:23 PM ? ?

## 2021-12-08 NOTE — Progress Notes (Signed)
Inpatient Rehabilitation Admission Medication Review by a Pharmacist ? ?A complete drug regimen review was completed for this patient to identify any potential clinically significant medication issues. ? ?High Risk Drug Classes Is patient taking? Indication by Medication  ?Antipsychotic Yes Compazine- N/V  ?Anticoagulant Yes Apixaban- AF  ?Antibiotic No   ?Opioid No   ?Antiplatelet No   ?Hypoglycemics/insulin No   ?Vasoactive Medication No   ?Chemotherapy No   ?Other Yes Protonix- GERD ?Crestor- HLD  ? ? ? ?Type of Medication Issue Identified Description of Issue Recommendation(s)  ?Drug Interaction(s) (clinically significant) ?    ?Duplicate Therapy ?    ?Allergy ?    ?No Medication Administration End Date ?    ?Incorrect Dose ?    ?Additional Drug Therapy Needed ?    ?Significant med changes from prior encounter (inform family/care partners about these prior to discharge).    ?Other ?    ? ? ?Clinically significant medication issues were identified that warrant physician communication and completion of prescribed/recommended actions by midnight of the next day:  No ? ?Time spent performing this drug regimen review (minutes):  30 ? ? ?Dhiren Azimi BS, PharmD, BCPS ?Clinical Pharmacist ?12/09/2021 7:47 AM ?

## 2021-12-09 ENCOUNTER — Inpatient Hospital Stay (HOSPITAL_COMMUNITY): Payer: Medicare (Managed Care)

## 2021-12-09 DIAGNOSIS — K5901 Slow transit constipation: Secondary | ICD-10-CM | POA: Diagnosis not present

## 2021-12-09 DIAGNOSIS — I482 Chronic atrial fibrillation, unspecified: Secondary | ICD-10-CM | POA: Diagnosis not present

## 2021-12-09 DIAGNOSIS — I639 Cerebral infarction, unspecified: Secondary | ICD-10-CM | POA: Diagnosis not present

## 2021-12-09 DIAGNOSIS — G8114 Spastic hemiplegia affecting left nondominant side: Secondary | ICD-10-CM

## 2021-12-09 DIAGNOSIS — I63511 Cerebral infarction due to unspecified occlusion or stenosis of right middle cerebral artery: Secondary | ICD-10-CM | POA: Diagnosis not present

## 2021-12-09 LAB — COMPREHENSIVE METABOLIC PANEL
ALT: 54 U/L — ABNORMAL HIGH (ref 0–44)
AST: 43 U/L — ABNORMAL HIGH (ref 15–41)
Albumin: 3.4 g/dL — ABNORMAL LOW (ref 3.5–5.0)
Alkaline Phosphatase: 68 U/L (ref 38–126)
Anion gap: 7 (ref 5–15)
BUN: 19 mg/dL (ref 8–23)
CO2: 21 mmol/L — ABNORMAL LOW (ref 22–32)
Calcium: 8.9 mg/dL (ref 8.9–10.3)
Chloride: 107 mmol/L (ref 98–111)
Creatinine, Ser: 0.9 mg/dL (ref 0.61–1.24)
GFR, Estimated: >60 mL/min (ref >60)
Glucose, Bld: 124 mg/dL — ABNORMAL HIGH (ref 70–99)
Potassium: 3.8 mmol/L (ref 3.5–5.1)
Sodium: 135 mmol/L (ref 135–145)
Total Bilirubin: 1 mg/dL (ref 0.3–1.2)
Total Protein: 6.1 g/dL — ABNORMAL LOW (ref 6.5–8.1)

## 2021-12-09 LAB — CBC WITH DIFFERENTIAL/PLATELET
Abs Immature Granulocytes: 0.04 10*3/uL (ref 0.00–0.07)
Basophils Absolute: 0.1 10*3/uL (ref 0.0–0.1)
Basophils Relative: 1 %
Eosinophils Absolute: 0.4 10*3/uL (ref 0.0–0.5)
Eosinophils Relative: 4 %
HCT: 40.4 % (ref 39.0–52.0)
Hemoglobin: 13.8 g/dL (ref 13.0–17.0)
Immature Granulocytes: 0 %
Lymphocytes Relative: 22 %
Lymphs Abs: 2 10*3/uL (ref 0.7–4.0)
MCH: 29.4 pg (ref 26.0–34.0)
MCHC: 34.2 g/dL (ref 30.0–36.0)
MCV: 86 fL (ref 80.0–100.0)
Monocytes Absolute: 0.8 10*3/uL (ref 0.1–1.0)
Monocytes Relative: 9 %
Neutro Abs: 5.7 10*3/uL (ref 1.7–7.7)
Neutrophils Relative %: 64 %
Platelets: 235 10*3/uL (ref 150–400)
RBC: 4.7 MIL/uL (ref 4.22–5.81)
RDW: 12.9 % (ref 11.5–15.5)
WBC: 9 10*3/uL (ref 4.0–10.5)
nRBC: 0 % (ref 0.0–0.2)

## 2021-12-09 MED ORDER — SORBITOL 70 % SOLN
60.0000 mL | Status: AC
  Administered 2021-12-09: 60 mL via ORAL
  Filled 2021-12-09: qty 60

## 2021-12-09 MED ORDER — TIZANIDINE HCL 2 MG PO TABS
2.0000 mg | ORAL_TABLET | Freq: Two times a day (BID) | ORAL | Status: DC
  Administered 2021-12-09 – 2021-12-10 (×3): 2 mg via ORAL
  Filled 2021-12-09 (×3): qty 1

## 2021-12-09 MED ORDER — ACETAMINOPHEN 325 MG PO TABS: 325.0000 mg | ORAL_TABLET | ORAL | Status: AC | PRN

## 2021-12-09 NOTE — Progress Notes (Signed)
?                                                       PROGRESS NOTE ? ? ?Subjective/Complaints: ?Pt with fairly uneventful night. Getting out of bed with OT. Early tone Left side ? ?ROS: Limited due to cognitive/behavioral  ? ? ?Objective: ?  ?CT HEAD WO CONTRAST (5MM) ? ?Result Date: 12/08/2021 ?CLINICAL DATA:  Acute right MCA stroke, follow-up. EXAM: CT HEAD WITHOUT CONTRAST TECHNIQUE: Contiguous axial images were obtained from the base of the skull through the vertex without intravenous contrast. RADIATION DOSE REDUCTION: This exam was performed according to the departmental dose-optimization program which includes automated exposure control, adjustment of the mA and/or kV according to patient size and/or use of iterative reconstruction technique. COMPARISON:  Two days ago FINDINGS: Brain: Extensive cytotoxic edema in the right MCA and distal ACA territory. Local mass effect with 4 mm midline shift. No herniation. No hemorrhage or hydrocephalus. Vascular: No interval abnormality. Skull: Normal. Negative for fracture or focal lesion. Sinuses/Orbits: No acute finding. IMPRESSION: 1. No detected progression of right MCA and distal ACA territory infarcts. No hemorrhagic conversion. 2. Cytotoxic edema causes 4 mm of midline shift. Electronically Signed   By: Jorje Guild M.D.   On: 12/08/2021 07:14  ? ?DG HIP UNILAT WITH PELVIS 1V LEFT ? ?Result Date: 12/08/2021 ?CLINICAL DATA:  Fall EXAM: DG HIP (WITH OR WITHOUT PELVIS) 1V*L* COMPARISON:  None. FINDINGS: Normal alignment. No acute fracture or dislocation. Mild right and moderate left hip degenerative arthritis. Mild heterotopic ossification versus intra-articular loose bodies adjacent to the left femoral neck. Soft tissues are unremarkable. L4-5 lumbar fusion with instrumentation incidentally noted. IMPRESSION: No acute fracture or dislocation. Bilateral degenerative hip arthritis, asymmetrically more severe involving the left hip. Possible left hip intra-articular  loose body. Electronically Signed   By: Fidela Salisbury M.D.   On: 12/08/2021 20:32   ?Recent Labs  ?  12/07/21 ?0307 12/09/21 ?0510  ?WBC 9.9 9.0  ?HGB 12.2* 13.8  ?HCT 36.0* 40.4  ?PLT 179 235  ? ?Recent Labs  ?  12/07/21 ?0307 12/09/21 ?0510  ?NA 139 135  ?K 4.0 3.8  ?CL 108 107  ?CO2 23 21*  ?GLUCOSE 114* 124*  ?BUN 16 19  ?CREATININE 0.77 0.90  ?CALCIUM 8.9 8.9  ? ? ?Intake/Output Summary (Last 24 hours) at 12/09/2021 0857 ?Last data filed at 12/09/2021 0530 ?Gross per 24 hour  ?Intake --  ?Output 1500 ml  ?Net -1500 ml  ?  ? ?  ? ?Physical Exam: ?Vital Signs ?Blood pressure (!) 104/92, pulse 93, temperature 98.6 ?F (37 ?C), temperature source Oral, resp. rate 18, height 6\' 1"  (1.854 m), SpO2 94 %. ? ?General: Alert and oriented x 3, No apparent distress ?HEENT: Head is normocephalic, atraumatic, PERRLA, EOMI, sclera anicteric, oral mucosa pink and moist, dentition intact, ext ear canals clear,  ?Neck: Supple without JVD or lymphadenopathy ?Heart: Reg rate and rhythm. No murmurs rubs or gallops ?Chest: CTA bilaterally without wheezes, rales, or rhonchi; no distress ?Abdomen: Soft, non-tender, non-distended, bowel sounds positive. ?Extremities: No clubbing, cyanosis, or edema. Pulses are 2+ ?Psych: [name] affect is appropriate. Pt is cooperative ?Skin: Clean and intact without signs of breakdown ?Neuro:  Pt is alert to person, place, reason. Follows simple commands. Left central 7 and left hemianopsia. Dysarthric.  LUE and LLE 0/5 flexor tone LUE and flex/ext tone LLE. DTR's 3+ on left ?Musculoskeletal: mild left hip pain, no limb pain with palpation  ? ? ?Assessment/Plan: ?1. Functional deficits which require 3+ hours per day of interdisciplinary therapy in a comprehensive inpatient rehab setting. ?Physiatrist is providing close team supervision and 24 hour management of active medical problems listed below. ?Physiatrist and rehab team continue to assess barriers to discharge/monitor patient progress toward functional  and medical goals ? ?Care Tool: ? ?Bathing ?   ?   ?   ?  ?  ?Bathing assist   ?  ?  ?Upper Body Dressing/Undressing ?Upper body dressing   ?  ?   ?Upper body assist   ?   ?Lower Body Dressing/Undressing ?Lower body dressing ? ? ?   ?  ? ?  ? ?Lower body assist   ?   ? ?Toileting ?Toileting    ?Toileting assist   ?  ?  ?Transfers ?Chair/bed transfer ? ?Transfers assist ?   ? ?  ?  ?  ?Locomotion ?Ambulation ? ? ?Ambulation assist ? ?   ? ?  ?  ?   ? ?Walk 10 feet activity ? ? ?Assist ?   ? ?  ?   ? ?Walk 50 feet activity ? ? ?Assist   ? ?  ?   ? ? ?Walk 150 feet activity ? ? ?Assist   ? ?  ?  ?  ? ?Walk 10 feet on uneven surface  ?activity ? ? ?Assist   ? ? ?  ?   ? ?Wheelchair ? ? ? ? ?Assist   ?  ?  ? ?  ?   ? ? ?Wheelchair 50 feet with 2 turns activity ? ? ? ?Assist ? ?  ?  ? ? ?   ? ?Wheelchair 150 feet activity  ? ? ? ?Assist ?   ? ? ?   ? ?Blood pressure (!) 104/92, pulse 93, temperature 98.6 ?F (37 ?C), temperature source Oral, resp. rate 18, height 6\' 1"  (1.854 m), SpO2 94 %. ? ? Medical Problem List and Plan: ?1. Functional deficits secondary to right MCA infarct likely embolic d/t  atrial fib ?            -patient may  shower ?            -ELOS/Goals: 14-20 days, min assist with PT, OT, SLP ? -Patient is beginning CIR therapies today including PT, OT, and SLP  ?2.  Antithrombotics: ?-DVT/anticoagulation:  Pharmaceutical: Other (comment)--Eliquis started on 04/04 ?            -antiplatelet therapy: N/A ?3. Left hip pain/Pain Management:   ?-xr without fx,?mild HO--> focus on ROM, spasticity control ?--Tylenol prn. Voltaren gel for local measures.  ?4. Mood: LCSW to follow for evaluation and support.  ?            -antipsychotic agents: N/A ?5. Neuropsych: This patient maybe intermittently capable of making decisions on his own behalf. ?6. Skin/Wound Care: Routine pressure relief measures.  ?7. Fluids/Electrolytes/Nutrition: encourage PO..   ? I personally reviewed the patient's labs today.   ?8. A fib:  Monitor HR TID-HR in 90's ?--monitor for symptoms with activity.  ?9. Dysphagia: Continue D2, nectar liquids with supervision for safety ?-encourage fluids, today's labs ok ?10. Hyperlipidemia: LDL- on Crestor 20 mg  ?11. Constipation: Will add Senna S  BID ?            -  no bm recorded since admit ?            4/5--sorbitol followed by SSE today  ?12. Acute blood loss anemia: Hbg down from 14.4-->12.2 ?--Monitor for signs of bleeding. ?            --hgb up to 13.8 4/5  ?13. H/o Cervical DDD w/cord compression:  s/p ACDF with residual numbness of bilateral hands ?14. Lumbar stenosis: S/p decompression Jan 2023.  ?15. Impaired fasting glucose: Hgb A1c-5.4.  ?16. Transaminitis: mild elevation today ? -likely reactive. F/u next week ?17. Spastic left hemiparesis ? -resting orthotics ordered ? -begin trial of tizanidine 2mg  bid ? ?  ? ?LOS: ?1 days ?A FACE TO FACE EVALUATION WAS PERFORMED ? ?Meredith Staggers ?12/09/2021, 8:57 AM  ? ?  ?

## 2021-12-09 NOTE — Discharge Instructions (Addendum)
? ? ? ? ?  Inpatient Rehab Discharge Instructions ? ?Patrick Rose ?Discharge date and time: 01/05/22  ? ?Activities/Precautions/ Functional Status: ?Activity: activity as tolerated ?Diet: dysphagia 2, thin liquids, full supervision at meals ?Wound Care: none needed ? ? ?Functional status:  ?___ No restrictions     ___ Walk up steps independently ?_X__ 24/7 supervision/assistance   ___ Walk up steps with assistance ?___ Intermittent supervision/assistance  ___ Bathe/dress independently ?___ Walk with walker     __X_ Bathe/dress with assistance ?___ Walk Independently    ___ Shower independently ?___ Walk with assistance    ___ Shower with assistance ?_X__ No alcohol     ___ Return to work/school ________ ? ?Special Instructions: ? ? ? ?My questions have been answered and I understand these instructions. I will adhere to these goals and the provided educational materials after my discharge from the hospital. ? ?Patient/Caregiver Signature _______________________________ Date __________ ? ?Clinician Signature _______________________________________ Date __________ ? ?Please bring this form and your medication list with you to all your follow-up doctor's appointments.  Information on my medicine - ELIQUIS? (apixaban) ? ?This medication education was reviewed with me or my healthcare representative as part of my discharge preparation.   ? ?Why was Eliquis? prescribed for you? ?Eliquis? was prescribed for you to reduce the risk of a blood clot forming that can cause a stroke if you have a medical condition called atrial fibrillation (a type of irregular heartbeat). ? ?What do You need to know about Eliquis? ? ?Take your Eliquis? TWICE DAILY - one tablet in the morning and one tablet in the evening with or without food. If you have difficulty swallowing the tablet whole please discuss with your pharmacist how to take the medication safely. ? ?Take Eliquis? exactly as prescribed by your doctor and DO NOT stop taking  Eliquis? without talking to the doctor who prescribed the medication.  Stopping may increase your risk of developing a stroke.  Refill your prescription before you run out. ? ?After discharge, you should have regular check-up appointments with your healthcare provider that is prescribing your Eliquis?.  In the future your dose may need to be changed if your kidney function or weight changes by a significant amount or as you get older. ? ?What do you do if you miss a dose? ?If you miss a dose, take it as soon as you remember on the same day and resume taking twice daily.  Do not take more than one dose of ELIQUIS at the same time to make up a missed dose. ? ?Important Safety Information ?A possible side effect of Eliquis? is bleeding. You should call your healthcare provider right away if you experience any of the following: ?Bleeding from an injury or your nose that does not stop. ?Unusual colored urine (red or dark brown) or unusual colored stools (red or black). ?Unusual bruising for unknown reasons. ?A serious fall or if you hit your head (even if there is no bleeding). ? ?Some medicines may interact with Eliquis? and might increase your risk of bleeding or clotting while on Eliquis?Marland Kitchen To help avoid this, consult your healthcare provider or pharmacist prior to using any new prescription or non-prescription medications, including herbals, vitamins, non-steroidal anti-inflammatory drugs (NSAIDs) and supplements. ? ?This website has more information on Eliquis? (apixaban): http://www.eliquis.com/eliquis/home  ?

## 2021-12-09 NOTE — Progress Notes (Signed)
Orthopedic Tech Progress Note ?Patient Details:  ?Patrick Rose ?[birth year] ?[id number] ? ?Patient ID: Patrick Rose, male   DOB: [birth year], 71 y.o.   MRN: [id number] ?Brace ordered. ?Al Decant ?12/09/2021, 12:44 AM ? ?

## 2021-12-09 NOTE — Evaluation (Signed)
Occupational Therapy Assessment and Plan ? ?Patient Details  ?Name: Patrick Rose ?MRN: [id number] ?Date of Birth: 08-31-1951 ? ?OT Diagnosis: cognitive deficits, hemiplegia affecting non-dominant side, and muscle weakness (generalized) ?Rehab Potential: Rehab Potential (ACUTE ONLY): Fair ?ELOS: 4 weeks  ? ?Today's Date: 12/09/2021 ?OT Individual Time: [id number] ?OT Individual Time Calculation (min): 90 min    ? ?Hospital Problem: Principal Problem: ?  Acute ischemic right MCA stroke ([place]) ? ? ?Past Medical History:  ?Past Medical History:  ?Diagnosis Date  ? Afib ([place])   ? Cervical myelopathy ([place])   ? Lumbar stenosis 09/2021  ? with pain radiatiing down buttocks to thighs  ? ?Past Surgical History:  ?Past Surgical History:  ?Procedure Laterality Date  ? ANTERIOR CERVICAL DECOMP/DISCECTOMY FUSION  2008  ? APPENDECTOMY    ? BACK SURGERY    ? IR CT HEAD LTD  12/03/2021  ? IR PERCUTANEOUS ART THROMBECTOMY/INFUSION INTRACRANIAL INC DIAG ANGIO  12/03/2021  ? RADIOLOGY WITH ANESTHESIA N/A 12/03/2021  ? Procedure: IR WITH ANESTHESIA;  Surgeon: Luanne Bras, MD;  Location: [facility];  Service: Radiology;  Laterality: N/A;  ? ? ?Assessment & Plan ?Clinical Impression: Patrick Rose is a 71 year old male with history of CAF- on ASA, aortic aneurysm who was admitted via [place] on 12/03/21 with acute onset of left hemiplegia with right gaze deviation and found to have hyperdense R-MCA sign. UDS negative. He received TNK and CTA head/neck done revealing acute occlusion of R-MCA likely due to embolus and small A1 segment with chronic occlusion and diffuse atherosclerotic disease distally. He underwent cerebral angio with complete revascularization of R-MCA M1 occlusion. Post procedure with drainage/oozing from right groin with diffuse ecchymosis.  ?  ?Follow up MRI brain done revealing large acute cortical/subcortical R-MCA territory infract with mild petechial hemorrhage within right basal ganglia, partial effacement of  right lateral ventricle and 1-2 mm leftward midline shift with moderate size acute cortical infarct within right  frontoparietal lobes as well as chronic 3.6 cm right cerebellar infarct. 2D echo done revealing EF 55-60% with moderate concentric LVH and aortic sclerosis without stenosis and borderline dilatation of ascending aorta. He was extubated without difficulty and kept NPO due to decreased alertness and wet voice with delayed cough.   ?  ?Serial CT head done for monitoring and most recent 04/04 showing no progression in R-MCA or distal ACA infarcts, cytotoxic edema with 4 mm midline shift and no hemorrhagic conversion.  He was maintained on ASA and transitioned to Eliquis today. Diet has been advanced to D2 w/nectar liquids. He has had issues with sleep wake disruption but lethargy resolving and po intake has been good. Patient with resultant dense left spastic hemiplegia with right gaze preference, decreased awareness of deficits and distractibility. Therapy has been working on balance at EOB due to posterior lean and pusher tendencies. CIR recommended due to functional decline.  Patient transferred to CIR on 12/08/2021 .   ? ?Patient currently requires total with basic self-care skills secondary to muscle weakness and muscle joint tightness, decreased cardiorespiratoy endurance, abnormal tone, unbalanced muscle activation, ataxia, decreased coordination, and decreased motor planning, decreased visual motor skills, decreased midline orientation, decreased attention to left, and decreased motor planning, decreased initiation, decreased attention, decreased awareness, decreased problem solving, decreased safety awareness, decreased memory, and delayed processing, and decreased sitting balance, decreased standing balance, decreased postural control, hemiplegia, and decreased balance strategies.  Prior to hospitalization, patient could complete ADLs with independent . ? ?Patient will benefit from skilled  intervention to decrease level of assist with basic self-care skills prior to discharge home with care partner.  Anticipate patient will require 24 hour supervision and moderate physical assestance and follow up home health. ? ?OT - End of Session ?Activity Tolerance: Tolerates 10 - 20 min activity with multiple rests ?Endurance Deficit: Yes ?Endurance Deficit Description: generalized weakness ?OT Assessment ?Rehab Potential (ACUTE ONLY): Fair ?OT Patient demonstrates impairments in the following area(s): Balance;Perception;Safety;Cognition;Sensory;Skin Integrity;Endurance;Vision;Motor;Pain ?OT Basic ADL's Functional Problem(s): Eating;Grooming;Bathing;Toileting;Dressing ?OT Transfers Functional Problem(s): Toilet;Tub/Shower ?OT Additional Impairment(s): Fuctional Use of Upper Extremity ?OT Plan ?OT Intensity: Minimum of 1-2 x/day, 45 to 90 minutes ?OT Frequency: 5 out of 7 days ?OT Duration/Estimated Length of Stay: 4 weeks ?OT Treatment/Interventions: Balance/vestibular training;Discharge planning;Functional electrical stimulation;Pain management;Self Care/advanced ADL retraining;UE/LE Coordination activities;Therapeutic Activities;Therapeutic Exercise;Skin care/wound managment;Cognitive remediation/compensation;Functional mobility training;Patient/family education;Visual/perceptual remediation/compensation;Wheelchair propulsion/positioning;UE/LE Strength taining/ROM;Splinting/orthotics;Psychosocial support;Neuromuscular re-education;DME/adaptive equipment instruction;Community reintegration ?OT Self Feeding Anticipated Outcome(s): (S) ?OT Basic Self-Care Anticipated Outcome(s): min A, mod A LB dressing ?OT Toileting Anticipated Outcome(s): min A ?OT Bathroom Transfers Anticipated Outcome(s): min A ?OT Recommendation ?Patient destination: Home ?Follow Up Recommendations: Home health OT ?Equipment Recommended: To be determined ? ? ?OT Evaluation ?Precautions/Restrictions  ?Precautions ?Precautions: Fall ?Precaution  Comments: L UE/LE tone and dense hemi, pusher, ?Restrictions ?Weight Bearing Restrictions: No ?General ?Chart Reviewed: Yes ?Family/Caregiver Present: No ?Vital Signs ? ?Pain ?Pain Assessment ?Pain Scale: 0-10 ?Pain Score: 5  ?Pain Type: Acute pain ?Pain Location: Hip ?Pain Orientation: Left ?Pain Descriptors / Indicators: Aching ?Pain Onset: With Activity (with spasms) ?Pain Intervention(s): Repositioned (muscle rub) ?Home Living/Prior Functioning ?Home Living ?Living Arrangements: Spouse/significant other ?Available Help at Discharge: Family, Available 24 hours/day ?Type of Home: House ?Home Access: Stairs to enter ?Entrance Stairs-Number of Steps: 8 ?Entrance Stairs-Rails: Right, Left ?Home Layout: Laundry or work area in basement ?Bathroom Shower/Tub: Walk-in shower ?Bathroom Toilet: Standard ?Bathroom Accessibility: Yes ?Additional Comments: Info gathered from chart ? Lives With: Spouse ?IADL History ?Homemaking Responsibilities:  (unsure, pt unreliable historian) ?Occupation: Retired ?Type of Occupation: Worked in maintenance ?Leisure and Hobbies: Likes to ride his bike ?Prior Function ?Level of Independence: Independent with basic ADLs, Independent with transfers, Independent with gait ?Vocation: Retired ?Vision ?Baseline Vision/History: 1 Wears glasses ?Ability to See in Adequate Light: 1 Impaired ?Patient Visual Report: No change from baseline ?Vision Assessment?: Yes ?Eye Alignment: Impaired (comment) ?Ocular Range of Motion: Restricted on the left ?Alignment/Gaze Preference: Gaze right ?Tracking/Visual Pursuits: Requires cues, head turns, or add eye shifts to track;Unable to hold eye position out of midline ?Saccades: Additional head turns occurred during testing ?Additional Comments: R gaze preference with L inattention, is able to scan to the L with cueing ?Perception  ?Perception: Impaired ?Inattention/Neglect: Does not attend to left side of body ?Praxis ?Praxis: Impaired ?Praxis Impairment Details:  Perseveration;Motor planning;Initiation ?Cognition ?Cognition ?Overall Cognitive Status: Impaired/Different from baseline ?Arousal/Alertness: Awake/alert ?Orientation Level: Person;Place;Situation ?Person: Oriented

## 2021-12-09 NOTE — Evaluation (Signed)
Speech Language Pathology Assessment and Plan ? ?Patient Details  ?Name: Patrick Rose ?MRN: [id number] ?Date of Birth: 23-Jun-1951 ? ?SLP Diagnosis: Dysarthria;Cognitive Impairments;Dysphagia  ?Rehab Potential: Good ?ELOS: 4 weeks  ? ? ?Today's Date: 12/09/2021 ?SLP Individual Time: [id number] ?SLP Individual Time Calculation (min): 67 min ? ? ?Hospital Problem: Principal Problem: ?  Acute ischemic right MCA stroke ([place]) ? ?Past Medical History:  ?Past Medical History:  ?Diagnosis Date  ? Afib ([place])   ? Cervical myelopathy ([place])   ? Lumbar stenosis 09/2021  ? with pain radiatiing down buttocks to thighs  ? ?Past Surgical History:  ?Past Surgical History:  ?Procedure Laterality Date  ? ANTERIOR CERVICAL DECOMP/DISCECTOMY FUSION  2008  ? APPENDECTOMY    ? BACK SURGERY    ? IR CT HEAD LTD  12/03/2021  ? IR PERCUTANEOUS ART THROMBECTOMY/INFUSION INTRACRANIAL INC DIAG ANGIO  12/03/2021  ? RADIOLOGY WITH ANESTHESIA N/A 12/03/2021  ? Procedure: IR WITH ANESTHESIA;  Surgeon: Luanne Bras, MD;  Location: [facility];  Service: Radiology;  Laterality: N/A;  ? ? ?Assessment / Plan / Recommendation ?Clinical Impression Patrick Rose is a 71 year old male with history of CAF- on ASA, aortic aneurysm who was admitted via [place] on 12/03/21 with acute onset of left hemiplegia with right gaze deviation and found to have hyperdense R-MCA sign. UDS negative. He received TNK and CTA head/neck done revealing acute occlusion of R-MCA likely due to embolus and small A1 segment with chronic occlusion and diffuse atherosclerotic disease distally. He underwent cerebral angio with complete revascularization of R-MCA M1 occlusion. Post procedure with drainage/oozing from right groin with diffuse ecchymosis.  ? Follow up MRI brain done revealing large acute cortical/subcortical R-MCA territory infract with mild petechial hemorrhage within right basal ganglia, partial effacement of right lateral ventricle and 1-2 mm leftward midline shift with  moderate size acute cortical infarct within right  frontoparietal lobes as well as chronic 3.6 cm right cerebellar infarct. 2D echo done revealing EF 55-60% with moderate concentric LVH and aortic sclerosis without stenosis and borderline dilatation of ascending aorta. He was extubated without difficulty and kept NPO due to decreased alertness and wet voice with delayed cough.   ? Serial CT head done for monitoring and most recent 04/04 showing no progression in R-MCA or distal ACA infarcts, cytotoxic edema with 4 mm midline shift and no hemorrhagic conversion.  He was maintained on ASA and transitioned to Eliquis today. Diet has been advanced to D2 w/nectar liquids. He has had issues with sleep wake disruption but lethargy resolving and po intake has been good. Patient with resultant dense left spastic hemiplegia with right gaze preference, decreased awareness of deficits and distractibility. Therapy has been working on balance at EOB due to posterior lean and pusher tendencies. CIR recommended due to functional decline.  Patient transferred to CIR on 12/08/2021 .   ? ?Pt presents with typical right hemisphere deficits, impairments include severe reduced emergent awareness/left head lean and reduced sustained attention, moderate deficits in mildly complex problem solving and mild deficits in recall. Pt demonstrates ability to correct left head lean with bio-feedback and max A verbal cues for brief periods of time. Pt demonstrated vague intellectual awareness. [name] performance was impacted by lethargy, impacting sustained and internal selective attention. Pt demonstrates reduced speech intelligibility at the sentence level with 70% intelligibility. Oral motor exam noted left facial and lingual ROM/weakness. Pt demonstrated reduce awareness of boluses, left labial spillage, left buccal pocketing, and suspect swallow initiation delay. Pt  demonstrated multiple swallows and immediate cough with thin liquids when consuming  consecutive sips via straw only. Pt demonstrated no overt s/s aspiration when consuming 1/2 TSP-controled cup sips of thin liquids. SLP recommends a diet of dys 2 textures and nectar thick liquids vis cup or straw, medication crushed in puree and full supervision A.  Pt would benefit from skilled ST services in order to maximize functional independence and reduce burden of care, likely requiring supervision at discharge with continued skilled ST services. ? ?  ?Skilled Therapeutic Interventions          Skilled ST services focused on cognitive skills. SLP facilitated administration of cognitive linguistic formal assessment and provided education of results. SLP provided mirror to aid in bio-feedback to correct left lean, pt was able to correct head lean to near midline with max A verbal cues, however only able to hold this position for less than 30 seconds. SLP and pt collaborated to set goals for cognitive linguistic needs during length of stay. All questions answered to satisfaction.  Pt was left in room with call bell within reach and bed alarm set. SLP recommends to continue skilled services.  ?  ?SLP Assessment ? Patient will need skilled Speech Lanaguage Pathology Services during CIR admission  ?  ?Recommendations ? SLP Diet Recommendations: Dysphagia 2 (Fine chop);Nectar ?Liquid Administration via: Cup;Straw ?Medication Administration: Crushed with puree ?Supervision: Staff to assist with self feeding;Full supervision/cueing for compensatory strategies ?Compensations: Minimize environmental distractions;Slow rate;Small sips/bites ?Postural Changes and/or Swallow Maneuvers: Seated upright 90 degrees ?Oral Care Recommendations: Oral care BID ?Patient destination: Home ?Follow up Recommendations: 24 hour supervision/assistance;Home Health SLP;Outpatient SLP ?Equipment Recommended: None recommended by SLP  ?  ?SLP Frequency 3 to 5 out of 7 days   ?SLP Duration ? ?SLP Intensity ? ?SLP Treatment/Interventions 4  weeks ? ?Minumum of 1-2 x/day, 30 to 90 minutes ? ?Cognitive remediation/compensation;Cueing hierarchy;Dysphagia/aspiration precaution training;Functional tasks;Medication managment;Patient/family education;Internal/external aids;Speech/Language facilitation   ? ?Pain ?Pain Assessment ?Pain Scale: 0-10 ?Pain Score: 0-No pain ?Pain Type: Acute pain ?Pain Location: Hip ?Pain Orientation: Left ?Pain Descriptors / Indicators: Aching ?Pain Onset: With Activity (with spasms) ?Pain Intervention(s): Repositioned (muscle rub) ? ?Prior Functioning ?Cognitive/Linguistic Baseline: Within functional limits ?Type of Home: House ? Lives With: Spouse ?Available Help at Discharge: Family;Available 24 hours/day ?Vocation: Retired ? ?SLP Evaluation ?Cognition ?Overall Cognitive Status: Impaired/Different from baseline ?Arousal/Alertness: Lethargic ?Orientation Level: Oriented X4 ?Attention: Sustained ?Sustained Attention: Appears intact ?Sustained Attention Impairment: Verbal basic;Functional basic ?Selective Attention: Impaired ?Selective Attention Impairment: Verbal basic;Functional basic ?Memory: Impaired ?Memory Impairment: Storage deficit;Decreased recall of new information ?Awareness: Impaired ?Awareness Impairment: Emergent impairment;Intellectual impairment (vague intellectual awareness) ?Problem Solving: Impaired ?Problem Solving Impairment: Verbal basic;Functional basic ?Behaviors: Perseveration ?Safety/Judgment: Impaired  ?Comprehension ?Auditory Comprehension ?Overall Auditory Comprehension: Appears within functional limits for tasks assessed ?Expression ?Expression ?Primary Mode of Expression: Verbal ?Verbal Expression ?Overall Verbal Expression: Appears within functional limits for tasks assessed ?Written Expression ?Dominant Hand: Right ?Oral Motor ?Oral Motor/Sensory Function ?Overall Oral Motor/Sensory Function: Mild impairment ?Facial Symmetry: Abnormal symmetry right ?Facial Sensation: Reduced left;Suspected CN V  (Trigeminal) dysfunction ?Lingual ROM: Within Functional Limits ?Lingual Symmetry: Abnormal symmetry right ?Motor Speech ?Overall Motor Speech: Impaired ?Respiration: Within functional limits ?Phonation:

## 2021-12-09 NOTE — Progress Notes (Signed)
Occupational Therapy Session Note ? ?Patient Details  ?Name: Patrick Rose ?MRN: [id number] ?Date of Birth: [birth year] ? ?Today's Date: 12/09/2021 ?OT Individual Time: 1400-1440 ?OT Individual Time Calculation (min): 40 min  ? ? ?Short Term Goals: ?Week 1:  OT Short Term Goal 1 (Week 1): Pt will maintain static sitting balance with max cueing and mod A ?OT Short Term Goal 2 (Week 1): Pt will don a shirt with mod A ?OT Short Term Goal 3 (Week 1): Pt will complete a sit <> stand with LRAD with max A of 1 ?OT Short Term Goal 4 (Week 1): Pt will demo improved sustained attention, requiring only mod cueing to attend to one grooming tasks ? ?Skilled Therapeutic Interventions/Progress Updates:  ?  Pt received in bed. No pain reported. Pt requires max-total of 2-3 with extra OT for skilled facilitation of head and trunk during sitting balance. [place] Dec plus utilized for 3 standing trials ~ 2-4 min per trial with seated rest break in between. Multimodal cuing utilized with mirror in front of pt for midline orientation and MAX cuing for attention. Pt often distracted by joking. Pt with incontinent BM in brief and attempted to change brief/peri care in standing however pt still voiding. Pt given time to void in semi stand/perch position but reporting light headedness and diaphoretic. Hastily brought down to bed into trendelenburg with BP assessed 139/94 HR 84 O2 94. Returned to [facility] elevated with bed pan underneath pt 110/80, HR86 O2 96. RN aware of potential vagal incident and pt still on bed pan. Exited session with pt seated in bed, exit alarm on and call light in reach ? ? ?Therapy Documentation ?Precautions:  ?Precautions ?Precautions: Fall ?Precaution Comments: L UE/LE tone and dense hemi, pusher, ?Restrictions ?Weight Bearing Restrictions: No ?General: ?  ?Vital Signs: ?Therapy Vitals ?Temp: 97.7 ?F (36.5 ?C) ?Pulse Rate: 70 ?Resp: 15 ?BP: 105/65 ?Patient Position (if appropriate): Lying ?Oxygen Therapy ?SpO2: 95 % ?O2 Device:  Room Air ?Pain: ?Pain Assessment ?Pain Score: 0-No pain ?ADL: ?ADL ?Eating: Minimal assistance, Moderate cueing ?Where Assessed-Eating: Bed level ?Grooming: Minimal cueing, Minimal assistance ?Where Assessed-Grooming: Bed level ?Upper Body Bathing: Maximal assistance ?Where Assessed-Upper Body Bathing: Edge of bed ?Lower Body Bathing: Dependent ?Where Assessed-Lower Body Bathing: Bed level ?Upper Body Dressing: Maximal assistance ?Where Assessed-Upper Body Dressing: Edge of bed ?Lower Body Dressing: Dependent ?Toileting: Dependent ?Where Assessed-Toileting: Bed level ?Toilet Transfer: Unable to assess ?Tub/Shower Transfer Method: Unable to assess ?Vision ?  ?Perception  ?  ?Praxis ?Praxis: Impaired ?Praxis Impairment Details: Perseveration;Motor planning;Initiation ?Balance ?  ?Exercises: ?  ?Other Treatments:   ? ? ?Therapy/Group: Individual Therapy ? ?Elenore Paddy Dejha King ?12/09/2021, 3:01 PM ?

## 2021-12-09 NOTE — Progress Notes (Signed)
Inpatient Rehabilitation  Patient information reviewed and entered into eRehab system by Amora Sheehy Twanisha Foulk, OTR/L.   Information including medical coding, functional ability and quality indicators will be reviewed and updated through discharge.    

## 2021-12-09 NOTE — Progress Notes (Signed)
Inpatient Rehabilitation Care Coordinator ?Assessment and Plan ?Patient Details  ?Name: Patrick Rose ?MRN: [id number] ?Date of Birth: 1951/07/22 ? ?Today's Date: 12/09/2021 ? ?Hospital Problems: Principal Problem: ?  Acute ischemic right MCA stroke ([place]) ? ?Past Medical History:  ?Past Medical History:  ?Diagnosis Date  ? Afib ([place])   ? Cervical myelopathy ([place])   ? Lumbar stenosis 09/2021  ? with pain radiatiing down buttocks to thighs  ? ?Past Surgical History:  ?Past Surgical History:  ?Procedure Laterality Date  ? ANTERIOR CERVICAL DECOMP/DISCECTOMY FUSION  2008  ? APPENDECTOMY    ? BACK SURGERY    ? IR CT HEAD LTD  12/03/2021  ? IR PERCUTANEOUS ART THROMBECTOMY/INFUSION INTRACRANIAL INC DIAG ANGIO  12/03/2021  ? RADIOLOGY WITH ANESTHESIA N/A 12/03/2021  ? Procedure: IR WITH ANESTHESIA;  Surgeon: Luanne Bras, MD;  Location: [facility];  Service: Radiology;  Laterality: N/A;  ? ?Social History:  reports that he has never smoked. He has never used smokeless tobacco. He reports that he does not currently use alcohol. He reports that he does not use drugs. ? ?Family / Support Systems ?Marital Status: Married ?How Long?: 51 years ?Patient Roles: Spouse, Parent ?Spouse/Significant Other: [name] (wife): [phone] ?Children: one adult dtr- Levada Dy, lives in [place]. ?Other Supports: None reported ?Anticipated Caregiver: Wife ?Ability/Limitations of Caregiver: None reported ?Caregiver Availability: 24/7 ?Family Dynamics: Pt lives with his wife. ? ?Social History ?Preferred language: English ?Religion:  ?Cultural Background: Pt worked in Public librarian for 28 yrs, and had plans to work part-time prior to hospitalization. ?Education: 12th grade ?Health Literacy - How often do you need to have someone help you when you read instructions, pamphlets, or other written material from your doctor or pharmacy?: Never ?Writes: Yes ?Employment Status: Employed ?Date Retired/Disabled/Unemployed: Pt intially retired with plans to work  part time. ?Return to Work Plans: TBD ?Legal History/Current Legal Issues: Denies ?Guardian/Conservator: N/A  ? ?Abuse/Neglect ?Abuse/Neglect Assessment Can Be Completed: Yes ?Physical Abuse: Denies ?Verbal Abuse: Denies ?Sexual Abuse: Denies ?Exploitation of patient/patient's resources: Denies ?Self-Neglect: Denies ? ?Patient response to: ?Social Isolation - How often do you feel lonely or isolated from those around you?: Never ? ?Emotional Status ?[name] affect, behavior and adjustment status: Pt in good spirits at time of visit ?Recent Psychosocial Issues: Denies ?Psychiatric History: Denies ?Substance Abuse History: Pt admits to drinking a whiskey on the rocks 2-3xs per week. ? ?Patient / Family Perceptions, Expectations & Goals ?Pt/Family understanding of illness & functional limitations: Pt wif has general understanding of pt care needs ?Premorbid pt/family roles/activities: Independent ?Anticipated changes in roles/activities/participation: Assistance with ADLs/IADLs ?Pt/family expectations/goals: Pt goal is wokr on "get up and walk around again. Wife's goal is for him to "be able to gte  up and move since he was independent prior to admission." ? ?Community Resources ?Community Agencies: None ?Premorbid Home Care/DME Agencies: None ?Transportation available at discharge: Wife ?Is the patient able to respond to transportation needs?: Yes ?In the past 12 months, has lack of transportation kept you from medical appointments or from getting medications?: No ?In the past 12 months, has lack of transportation kept you from meetings, work, or from getting things needed for daily living?: No ?Resource referrals recommended: Neuropsychology ? ?Discharge Planning ?Living Arrangements: Spouse/significant other ?Support Systems: Spouse/significant other ?Type of Residence: Private residence ?Insurance Resources: Multimedia programmer (specify) Scientist, clinical (histocompatibility and immunogenetics) Medicare) ?Financial Resources: Social Security ?Financial Screen Referred:  No ?Living Expenses: Own ?Money Management: Patient ?Does the patient have any problems obtaining your medications?: No ?Home Management: Pt  wife manages all homecare needs ?Patient/Family Preliminary Plans: Pt dtr to help with manage finances at this time. ?Care Coordinator Barriers to Discharge: Decreased caregiver support, Lack of/limited family support ?Care Coordinator Anticipated Follow Up Needs: HH/OP ? ?Clinical Impression ?SW met with pt and pt wife in room to introduce self, explain role, and discuss discharge process. Pt is not a English as a second language teacher. HCPOA- dtr Angie. No DME.  ? ?Rana Snare ?12/09/2021, 3:40 PM ? ?  ?

## 2021-12-09 NOTE — Progress Notes (Signed)
BLE venous duplex has been completed. ? ?Results can be found under chart review under CV PROC. ?12/09/2021 3:50 PM ?Louay Myrie RVT, RDMS ? ?

## 2021-12-10 DIAGNOSIS — K5901 Slow transit constipation: Secondary | ICD-10-CM | POA: Diagnosis not present

## 2021-12-10 DIAGNOSIS — G8114 Spastic hemiplegia affecting left nondominant side: Secondary | ICD-10-CM | POA: Diagnosis not present

## 2021-12-10 DIAGNOSIS — I63511 Cerebral infarction due to unspecified occlusion or stenosis of right middle cerebral artery: Secondary | ICD-10-CM | POA: Diagnosis not present

## 2021-12-10 DIAGNOSIS — I482 Chronic atrial fibrillation, unspecified: Secondary | ICD-10-CM | POA: Diagnosis not present

## 2021-12-10 MED ORDER — TIZANIDINE HCL 2 MG PO TABS
2.0000 mg | ORAL_TABLET | Freq: Three times a day (TID) | ORAL | Status: DC
  Administered 2021-12-10 – 2021-12-16 (×19): 2 mg via ORAL
  Filled 2021-12-10 (×19): qty 1

## 2021-12-10 NOTE — Progress Notes (Addendum)
?                                                       PROGRESS NOTE ? ? ?Subjective/Complaints: ?Pt states that things went fairly well with therapy yesterday. Still has a lot of tightness on the left side which he reports is old. Mentation a little better today ? ?ROS: Patient denies fever, rash, sore throat, blurred vision, dizziness, nausea, vomiting, diarrhea, cough, shortness of breath or chest pain,   headache, or mood change.  ? ? ?Objective: ?  ?DG HIP UNILAT WITH PELVIS 1V LEFT ? ?Result Date: 12/08/2021 ?CLINICAL DATA:  Fall EXAM: DG HIP (WITH OR WITHOUT PELVIS) 1V*L* COMPARISON:  None. FINDINGS: Normal alignment. No acute fracture or dislocation. Mild right and moderate left hip degenerative arthritis. Mild heterotopic ossification versus intra-articular loose bodies adjacent to the left femoral neck. Soft tissues are unremarkable. L4-5 lumbar fusion with instrumentation incidentally noted. IMPRESSION: No acute fracture or dislocation. Bilateral degenerative hip arthritis, asymmetrically more severe involving the left hip. Possible left hip intra-articular loose body. Electronically Signed   By: Fidela Salisbury M.D.   On: 12/08/2021 20:32  ? ?VAS [place] LOWER EXTREMITY VENOUS (DVT) ? ?Result Date: 12/09/2021 ? Lower Venous DVT Study Patient Name:  Patrick Rose  Date of Exam:   12/09/2021 Medical Rec #: [id number]       Accession #:    [id number] Date of Birth: 1950-11-08       Patient Gender: M Patient Age:   71 years Exam Location:  [facility] Procedure:      VAS [place] LOWER EXTREMITY VENOUS (DVT) Referring Phys: PAMELA LOVE --------------------------------------------------------------------------------  Indications: Stroke.  Comparison Study: No previous exams Performing Technologist: Jody Hill RVT, RDMS  Examination Guidelines: A complete evaluation includes B-mode imaging, spectral Doppler, color Doppler, and power Doppler as needed of all accessible portions of each vessel. Bilateral testing is  considered an integral part of a complete examination. Limited examinations for reoccurring indications may be performed as noted. The reflux portion of the exam is performed with the patient in reverse Trendelenburg.  +---------+---------------+---------+-----------+----------+--------------+ RIGHT    CompressibilityPhasicitySpontaneityPropertiesThrombus Aging +---------+---------------+---------+-----------+----------+--------------+ CFV      Full           Yes      Yes                                 +---------+---------------+---------+-----------+----------+--------------+ SFJ      Full                                                        +---------+---------------+---------+-----------+----------+--------------+ FV Prox  Full           Yes      Yes                                 +---------+---------------+---------+-----------+----------+--------------+ FV Mid   Full           Yes      Yes                                 +---------+---------------+---------+-----------+----------+--------------+  FV DistalFull           Yes      Yes                                 +---------+---------------+---------+-----------+----------+--------------+ PFV      Full                                                        +---------+---------------+---------+-----------+----------+--------------+ POP      Full           Yes      Yes                                 +---------+---------------+---------+-----------+----------+--------------+ PTV      Full                                                        +---------+---------------+---------+-----------+----------+--------------+ PERO     Full                                                        +---------+---------------+---------+-----------+----------+--------------+   +---------+---------------+---------+-----------+----------+--------------+ LEFT      CompressibilityPhasicitySpontaneityPropertiesThrombus Aging +---------+---------------+---------+-----------+----------+--------------+ CFV      Full           Yes      Yes                                 +---------+---------------+---------+-----------+----------+--------------+ SFJ      Full                                                        +---------+---------------+---------+-----------+----------+--------------+ FV Prox  Full           Yes      Yes                                 +---------+---------------+---------+-----------+----------+--------------+ FV Mid   Full           Yes      Yes                                 +---------+---------------+---------+-----------+----------+--------------+ FV DistalFull           Yes      Yes                                 +---------+---------------+---------+-----------+----------+--------------+ PFV      Full                                                        +---------+---------------+---------+-----------+----------+--------------+  POP      Full           Yes      Yes                                 +---------+---------------+---------+-----------+----------+--------------+ PTV      Full                                                        +---------+---------------+---------+-----------+----------+--------------+ PERO     Full                                                        +---------+---------------+---------+-----------+----------+--------------+     Summary: BILATERAL: - No evidence of deep vein thrombosis seen in the lower extremities, bilaterally. - No evidence of superficial venous thrombosis in the lower extremities, bilaterally. -No evidence of popliteal cyst, bilaterally.   *See table(s) above for measurements and observations. Electronically signed by Orlie Pollen on 12/09/2021 at 4:45:06 PM.    Final    ?Recent Labs  ?  12/09/21 ?0510  ?WBC 9.0  ?HGB 13.8  ?HCT 40.4   ?PLT 235  ? ?Recent Labs  ?  12/09/21 ?0510  ?NA 135  ?K 3.8  ?CL 107  ?CO2 21*  ?GLUCOSE 124*  ?BUN 19  ?CREATININE 0.90  ?CALCIUM 8.9  ? ? ?Intake/Output Summary (Last 24 hours) at 12/10/2021 1050 ?Last data filed at 12/10/2021 [id number] ?Gross per 24 hour  ?Intake 295 ml  ?Output 650 ml  ?Net -355 ml  ?  ? ?  ? ?Physical Exam: ?Vital Signs ?Blood pressure 111/74, pulse 83, temperature 98 ?F (36.7 ?C), resp. rate 18, height 6\' 1"  (1.854 m), SpO2 96 %. ? ?Constitutional: No distress . Vital signs reviewed. ?HEENT: NCAT, EOMI, oral membranes moist ?Neck: supple ?Cardiovascular: RRR without murmur. No JVD    ?Respiratory/Chest: CTA Bilaterally without wheezes or rales. Normal effort    ?GI/Abdomen: BS +, non-tender, non-distended ?Ext: no clubbing, cyanosis, or edema ?Psych: pleasant and cooperative  ?Skin: Clean and intact without signs of breakdown ?Neuro:  Pt is alert to person, place, reason. Follows simple commands. Left central 7 and left hemianopsia. Dysarthric. LUE and LLE 0/5 with significant flexor tone LUE  1/4, and flex/ext tone LLE 2-3/4. DTR's 3+ on left ?Musculoskeletal: mild left hip pain, no limb pain with palpation  ? ? ?Assessment/Plan: ?1. Functional deficits which require 3+ hours per day of interdisciplinary therapy in a comprehensive inpatient rehab setting. ?Physiatrist is providing close team supervision and 24 hour management of active medical problems listed below. ?Physiatrist and rehab team continue to assess barriers to discharge/monitor patient progress toward functional and medical goals ? ?Care Tool: ? ?Bathing ?   ?Body parts bathed by patient: Chest, Abdomen, Front perineal area, Right upper leg, Left upper leg, Face  ? Body parts bathed by helper: Buttocks, Right arm, Left arm, Right lower leg, Left lower leg ?  ?  ?Bathing assist Assist Level: 2 Helpers ?  ?  ?Upper Body Dressing/Undressing ?Upper body dressing   ?What is the patient wearing?: Pull over  shirt ?   ?Upper body assist Assist  Level: Maximal Assistance - Patient 25 - 49% ?   ?Lower Body Dressing/Undressing ?Lower body dressing ? ? ?   ?What is the patient wearing?: Pants ? ?  ? ?Lower body assist Assist for lower body dressing: Total Assistance - Patient < 25% ?   ? ?Toileting ?Toileting    ?Toileting assist Assist for toileting: Dependent - Patient 0% ?  ?  ?Transfers ?Chair/bed transfer ? ?Transfers assist ? Cha

## 2021-12-10 NOTE — Plan of Care (Signed)
?  Problem: RH Balance ?Goal: LTG Patient will maintain dynamic sitting balance (PT) ?Description: LTG:  Patient will maintain dynamic sitting balance with assistance during mobility activities (PT) ?Flowsheets (Taken 12/09/2021 1919) ?LTG: Pt will maintain dynamic sitting balance during mobility activities with:: Supervision/Verbal cueing ?Goal: LTG Patient will maintain dynamic standing balance (PT) ?Description: LTG:  Patient will maintain dynamic standing balance with assistance during mobility activities (PT) ?Flowsheets (Taken 12/09/2021 1919) ?LTG: Pt will maintain dynamic standing balance during mobility activities with:: Contact Guard/Touching assist ?  ?Problem: Sit to Stand ?Goal: LTG:  Patient will perform sit to stand with assistance level (PT) ?Description: LTG:  Patient will perform sit to stand with assistance level (PT) ?Flowsheets (Taken 12/09/2021 1919) ?LTG: PT will perform sit to stand in preparation for functional mobility with assistance level: Minimal Assistance - Patient > 75% ?  ?Problem: RH Bed Mobility ?Goal: LTG Patient will perform bed mobility with assist (PT) ?Description: LTG: Patient will perform bed mobility with assistance, with/without cues (PT). ?Flowsheets (Taken 12/09/2021 1919) ?LTG: Pt will perform bed mobility with assistance level of: Minimal Assistance - Patient > 75% ?  ?Problem: RH Bed to Chair Transfers ?Goal: LTG Patient will perform bed/chair transfers w/assist (PT) ?Description: LTG: Patient will perform bed to chair transfers with assistance (PT). ?Flowsheets (Taken 12/09/2021 1919) ?LTG: Pt will perform Bed to Chair Transfers with assistance level: Minimal Assistance - Patient > 75% ?  ?Problem: RH Car Transfers ?Goal: LTG Patient will perform car transfers with assist (PT) ?Description: LTG: Patient will perform car transfers with assistance (PT). ?Flowsheets (Taken 12/09/2021 1919) ?LTG: Pt will perform car transfers with assist:: Minimal Assistance - Patient > 75% ?   ?Problem: RH Furniture Transfers ?Goal: LTG Patient will perform furniture transfers w/assist (OT/PT) ?Description: LTG: Patient will perform furniture transfers  with assistance (OT/PT). ?Flowsheets (Taken 12/09/2021 1919) ?LTG: Pt will perform furniture transfers with assist:: Minimal Assistance - Patient > 75% ?  ?Problem: RH Ambulation ?Goal: LTG Patient will ambulate in controlled environment (PT) ?Description: LTG: Patient will ambulate in a controlled environment, # of feet with assistance (PT). ?Flowsheets (Taken 12/09/2021 1919) ?LTG: Pt will ambulate in controlled environ  assist needed:: Moderate Assistance - Patient 50 - 74% ?LTG: Ambulation distance in controlled environment: 75 ft using LRAD ?  ?Problem: RH Wheelchair Mobility ?Goal: LTG Patient will propel w/c in controlled environment (PT) ?Description: LTG: Patient will propel wheelchair in controlled environment, # of feet with assist (PT) ?Flowsheets (Taken 12/09/2021 1919) ?LTG: Pt will propel w/c in controlled environ  assist needed:: Contact Guard/Touching assist ?LTG: Propel w/c distance in controlled environment: at least 100 ft ?  ?Problem: RH Stairs ?Goal: LTG Patient will ambulate up and down stairs w/assist (PT) ?Description: LTG: Patient will ambulate up and down # of stairs with assistance (PT) ?Flowsheets (Taken 12/09/2021 1919) ?LTG: Pt will ambulate up/down stairs assist needed:: Moderate Assistance - Patient 50 - 74% ?LTG: Pt will  ambulate up and down number of stairs: at least 8 steps using HR setup as per home environment in order to enter/ exit home. ?  ?

## 2021-12-10 NOTE — Progress Notes (Signed)
Speech Language Pathology Daily Session Note ? ?Patient Details  ?Name: Patrick Rose ?MRN: [id number] ?Date of Birth: 1950/11/17 ? ?Today's Date: 12/10/2021 ?SLP Individual Time: 1300-1400 ?SLP Individual Time Calculation (min): 60 min ? ?Short Term Goals: ?Week 1: SLP Short Term Goal 1 (Week 1): Pt will consume current diet Dys 2 textures and Nectar thick liquids with mod A verbal cues to correct left spillage and pocketing. ?SLP Short Term Goal 2 (Week 1): Pt will consume thin liquid trials via cup with minimal s/s aspiration piror instrumental swallow assessment. ?SLP Short Term Goal 3 (Week 1): Pt will complete mildly complex problem solving tasks with min A verbal cues. ?SLP Short Term Goal 4 (Week 1): Pt will demonstrate verbal and functional error awareness with mod A verbal cues. ?SLP Short Term Goal 5 (Week 1): Pt will demonstrate sustained attention in functional tasks in 10-15 minute intervals with mod A verbal cues. ?SLP Short Term Goal 6 (Week 1): Pt will demonstrate use of speech intelligibility strategies at the sentence level producing 80% intelligibility with min A verbal cues. ? ?Skilled Therapeutic Interventions: Skilled treatment session focused on cognitive and dysphagia goals. SLP facilitated session by administering the [facility] Mental Status Examination (SLUMS). Patient scored  19/30 points with a score of 27 or above considered normal. Patient demonstrated deficits in working and short-term memory as well as attention. Patient required frequent redirection throughout session due to verbosity with poor topic maintenance, however, in increase in attention to left field of environment/scanning was noted. Patient also with intermittent overt s/s of aspiration when consuming nectar-thick liquids via cup, suspect due to tilt in his wheelchair. Aspiration events were eliminated when patient was upright in the wheelchair, both the patient and his wife were provided education regarding  importance of postioning with PO intake. Patient left upright in wheelchair with alarm on and all needs within reach. Continue with current plan of care.  ? ?   ? ?Pain ?No/Denies Pain  ? ?Therapy/Group: Individual Therapy ? ?Nefi Musich ?12/10/2021, 3:33 PM ?

## 2021-12-10 NOTE — Evaluation (Addendum)
Physical Therapy Assessment and Plan ? ?Patient Details  ?Name: Patrick Rose ?MRN: [id number] ?Date of Birth: Mar 03, 1951 ? ?PT Diagnosis: Ataxia, Coordination disorder, Difficulty walking, Hemiparesis non-dominant, Hypertonia, Impaired cognition, Impaired sensation, Muscle weakness, and Pain in L hip ?Rehab Potential: Fair ?ELOS: 21-27 days  ? ?Today's Date: 12/09/2021 ?PT Individual Time:  1030-1130  ?PT Individual Time Calculation (min): 60 min ? ?Hospital Problem: Principal Problem: ?  Acute ischemic right MCA stroke ([place]) ? ? ?Past Medical History:  ?Past Medical History:  ?Diagnosis Date  ? Afib ([place])   ? Cervical myelopathy ([place])   ? Lumbar stenosis 09/2021  ? with pain radiatiing down buttocks to thighs  ? ?Past Surgical History:  ?Past Surgical History:  ?Procedure Laterality Date  ? ANTERIOR CERVICAL DECOMP/DISCECTOMY FUSION  2008  ? APPENDECTOMY    ? BACK SURGERY    ? IR CT HEAD LTD  12/03/2021  ? IR PERCUTANEOUS ART THROMBECTOMY/INFUSION INTRACRANIAL INC DIAG ANGIO  12/03/2021  ? RADIOLOGY WITH ANESTHESIA N/A 12/03/2021  ? Procedure: IR WITH ANESTHESIA;  Surgeon: Luanne Bras, MD;  Location: [facility];  Service: Radiology;  Laterality: N/A;  ? ? ?Assessment & Plan ?Clinical Impression: Patient is a 71 y.o.  with history of CAF- on ASA, aortic aneurysm who was admitted via [facility] on 12/03/21 with acute onset of left hemiplegia with right gaze deviation and found to have hyperdense R-MCA sign. UDS negative. He received TNK and CTA head/neck done revealing acute occlusion of R-MCA likely due to embolus and small A1 segment with chronic occlusion and diffuse atherosclerotic disease distally. He underwent cerebral angio with complete revascularization of R-MCA M1 occlusion. Post procedure with drainage/oozing from right groin with diffuse ecchymosis.  ?  ?Follow up MRI brain done revealing large acute cortical/subcortical R-MCA territory infract with mild petechial hemorrhage within right basal ganglia,  partial effacement of right lateral ventricle and 1-2 mm leftward midline shift with moderate size acute cortical infarct within right  frontoparietal lobes as well as chronic 3.6 cm right cerebellar infarct. 2D echo done revealing EF 55-60% with moderate concentric LVH and aortic sclerosis without stenosis and borderline dilatation of ascending aorta. He was extubated without difficulty and kept NPO due to decreased alertness and wet voice with delayed cough.   ?  ?Serial CT head done for monitoring and most recent 04/04 showing no progression in R-MCA or distal ACA infarcts, cytotoxic edema with 4 mm midline shift and no hemorrhagic conversion.  He was maintained on ASA and transitioned to Eliquis today. Diet has been advanced to D2 w/nectar liquids. He has had issues with sleep wake disruption but lethargy resolving and po intake has been good. Patient with resultant dense left spastic hemiplegia with right gaze preference, decreased awareness of deficits and distractibility. Therapy has been working on balance at EOB due to posterior lean and pusher tendencies. CIR recommended due to functional decline.  .  Patient transferred to CIR on 12/08/2021 .  ? ?Patient currently requires total with mobility secondary to muscle weakness, decreased cardiorespiratoy endurance, impaired timing and sequencing, abnormal tone, unbalanced muscle activation, ataxia, and decreased coordination, decreased visual acuity, decreased midline orientation, decreased attention to left, left side neglect, and decreased motor planning, decreased initiation, decreased attention, decreased awareness, decreased problem solving, decreased safety awareness, decreased memory, and delayed processing, and decreased sitting balance, decreased standing balance, decreased postural control, decreased balance strategies, and hemipareisis .  Prior to hospitalization, patient was independent  with mobility and lived with Spouse in a House  home.  Home  access is 8Stairs to enter. ? ?Patient will benefit from skilled PT intervention to maximize safe functional mobility, minimize fall risk, and decrease caregiver burden for planned discharge home with 24 hour assist.  Anticipate patient will benefit from follow up [facility] at discharge. ? ?PT - End of Session ?Activity Tolerance: Tolerates 30+ min activity with multiple rests ?Endurance Deficit: Yes ?Endurance Deficit Description: generalized weakness ?PT Assessment ?Rehab Potential (ACUTE/IP ONLY): Fair ?PT Barriers to Discharge: Inaccessible home environment;Incontinence;Neurogenic Bowel & Bladder;Medication compliance;Wound Care;Decreased caregiver support;Home environment access/layout;Lack of/limited family support;Behavior;Nutrition means;Insurance for SNF coverage;Weight ?PT Plan ?PT Intensity: Minimum of 1-2 x/day ,45 to 90 minutes ?PT Frequency: 5 out of 7 days ?PT Duration Estimated Length of Stay: 21-27 days ?PT Treatment/Interventions: Ambulation/gait training;Discharge planning;Functional mobility training;Psychosocial support;Therapeutic Activities;Visual/perceptual remediation/compensation;Balance/vestibular training;Disease management/prevention;Neuromuscular re-education;Skin care/wound management;Therapeutic Exercise;Wheelchair propulsion/positioning;UE/LE Strength taining/ROM;Splinting/orthotics;Pain management;DME/adaptive equipment instruction;Cognitive remediation/compensation;Community reintegration;Functional electrical stimulation;Patient/family education;Stair training;UE/LE Coordination activities ?PT Recommendation ?Follow Up Recommendations: None ?Patient destination: Home ?Equipment Recommended: To be determined ? ? ?PT Evaluation ?Precautions/Restrictions ?Precautions ?Precautions: Fall ?Precaution Comments: dense L hemipareisis with UE/LE tone, pushes to L, limited focus ?Restrictions ?Weight Bearing Restrictions: No ?General ?  Vital SignsTherapy Vitals ?Temp: 98 ?F (36.7 ?C) ?Pulse Rate:  83 ?Resp: 18 ?BP: 111/74 ?Patient Position (if appropriate): Lying ?Oxygen Therapy ?SpO2: 96 % ?O2 Device: Room Air ?Pain ?Pain Assessment ?Pain Scale: 0-10 ?Pain Score: 2  ?Pain Type: Acute pain ?Pain Location: Hip ?Pain Orientation: Left ?Pain Descriptors / Indicators: Aching ?Pain Onset: With Activity ?Patients Stated Pain Goal: 0 ?Pain Intervention(s): Repositioned ?Pain Interference ?Pain Interference ?Pain Effect on Sleep: 1. Rarely or not at all ?Pain Interference with Therapy Activities: 2. Occasionally ?Pain Interference with Day-to-Day Activities: 2. Occasionally ?Home Living/Prior Functioning ?Home Living ?Available Help at Discharge: Family;Available 24 hours/day ?Type of Home: House ?Home Access: Stairs to enter ?Entrance Stairs-Number of Steps: 8 ?Entrance Stairs-Rails: Right;Left ?Home Layout: Laundry or work area in basement ?Bathroom Shower/Tub: Walk-in shower ?Bathroom Toilet: Standard ?Bathroom Accessibility: Yes ?Additional Comments: Info gathered from chart ? Lives With: Spouse ?Prior Function ?Level of Independence: Independent with basic ADLs;Independent with transfers;Independent with gait ? Able to Take Stairs?: Yes ?Driving: Yes ?Vocation: Retired ?Leisure: Hobbies-yes (Comment) (cycling) ?Vision/Perception  ?Vision - History ?Ability to See in Adequate Light: 1 Impaired ?Vision - Assessment ?Additional Comments: R gaze preference with L inattention ?Perception ?Perception: Impaired ?Inattention/Neglect: Does not attend to left side of body ?Comments: will turn head/neck to L with cues, and is able to locate L UE when cued. but does not spontaneously attend to Lt. Pt demonstrates pusher syndrome ?Praxis ?Praxis: Impaired ?Praxis Impairment Details: Initiation;Motor planning;Perseveration  ?Cognition ?  ?Sensation ?Sensation ?Light Touch: Impaired Detail ?Peripheral sensation comments: absent LLE more distal than proximal; L3-L4 indicated as "numb", L5, S1, S2 no feeling ?Light Touch  Impaired Details: Absent LLE;Absent LUE ?Coordination ?Gross Motor Movements are Fluid and Coordinated: No ?Fine Motor Movements are Fluid and Coordinated: No ?Coordination and Movement Description: Dense L hemi. LLE

## 2021-12-10 NOTE — Progress Notes (Signed)
Physical Therapy Session Note ? ?Patient Details  ?Name: Patrick Rose ?MRN: [id number] ?Date of Birth: August 29, 1951 ? ?Today's Date: 12/10/2021 ?PT Individual Time: 8182-9937 ?PT Individual Time Calculation (min): 60 min  ? ?Short Term Goals: ?Week 1:  PT Short Term Goal 1 (Week 1): Pt will perform bed mobility with consistent MaxA and imrproved ability to follow instructions. ?PT Short Term Goal 2 (Week 1): Pt will perform sit<>stand with consistent Mod/ Max +2. ?PT Short Term Goal 3 (Week 1): Pt will perform seat to seat transfers with consistent MaxA +2 ?PT Short Term Goal 4 (Week 1): Pt will demonstrate improved balance with decreased pushing to L side to <50%. ? ?Skilled Therapeutic Interventions/Progress Updates:  ?   ?Patient in TIS w/c upon PT arrival. Patient alert and agreeable to PT session. Patient reported 4-6/10 intermittent back pain during session, RN made aware. PT provided repositioning, rest breaks, and distraction as pain interventions throughout session.  ? ?Therapeutic Activity: ?Bed Mobility: Patient performed supine to sit with mod A on a mat table, performed lateral scooting with total A and rolling with mod-max A due to motor planning deficits. Provided verbal cues for sequencing and L extremity management. ?Transfers: Patient performed lateral scoot transfer TIS w/c>mat table to the L with max A, during scoot onto mat table, w/c moved away and PT had to bring patient's knees to chest and have patient lower to lying on the mat with max-total A for safety. He performed a lateral scoot mat table>TIS w/c to the R with mod A +2, demonstrating increased lower extremity activation and hip clearance. Provided cues for hand placement and head-hips relationship for proper technique and decreased assist with transfers. Blocked L leg from flexor synergy activation by locking PT foot behind his L foot during transfers. ? ?Neuromuscular Re-ed: ?Patient performed the following sitting balance and lower  extremity motor contorl activities: ?-supine L knee/hip flexion extension with mild resistance with activation and use of tapping to agonist 2x10 ?-supine B hip abd/add in hook-lying with facilitation for L hip activation 2x10 ?-sitting balance working on midline orientation with R visual/tactile target >5 min ?-forward reaching while donning shoes focused on midline with reach to prevent L lean ?-trunk control down to R elbow then pushing only to midline x5 ? ?Patient in TIS w/c with his wife in the room at end of session with breaks locked, seat belt alarm set, and all needs within reach.  ? ?Therapy Documentation ?Precautions:  ?Precautions ?Precautions: Fall ?Precaution Comments: dense L hemipareisis with UE/LE tone, pushes to L, limited focus ?Restrictions ?Weight Bearing Restrictions: No ? ? ? ?Therapy/Group: Individual Therapy ? ?Helayne Seminole PT, DPT ? ?12/10/2021, 4:33 PM  ?

## 2021-12-10 NOTE — Progress Notes (Signed)
Occupational Therapy Session Note ? ?Patient Details  ?Name: Patrick Rose ?MRN: [id number] ?Date of Birth: 1950-10-21 ? ?Today's Date: 12/10/2021 ?OT Individual Time: [id number] ?OT Individual Time Calculation (min): 65 min  ? ? ?Short Term Goals: ?Week 1:  OT Short Term Goal 1 (Week 1): Pt will maintain static sitting balance with max cueing and mod A ?OT Short Term Goal 2 (Week 1): Pt will don a shirt with mod A ?OT Short Term Goal 3 (Week 1): Pt will complete a sit <> stand with LRAD with max A of 1 ?OT Short Term Goal 4 (Week 1): Pt will demo improved sustained attention, requiring only mod cueing to attend to one grooming tasks ? ?Skilled Therapeutic Interventions/Progress Updates:  ?  1:1 Pt received in bed with wife present - assisting him with eating breakfast. Pt continues to demonstrate decr attention to task requiring max cues to sustain attention. Pt did present with tone in left LE at hip and knee but with time and supported against gravity pt able to bend left knee up to chest to help with positioning (while laying supine in bed. Threaded pants with total A in supine with mod A to roll to the right and max A to roll to the left. Pt required max A to come into sitting at EOB with max cues and for facilitation for transitional movement to come to EOB. Pt goes right into pushing towards the left with right UE and requires mod to max A to maintain sitting balance with constant multimodal cues to decr pushing. Pt with difficulty following and maintaining proper positioning of right hand to decr pushing. Focus on organizing and donning shirt with max A with focus on visual attention to the left and awareness of positioning of left UE. Pt transferred squat pivot into the w/c with max A +2 on his right with 2nd person holding his right LE. In the chair tilted him back all the way to help him relax into not pushing and to address his neck tightness - turning his head to the left  actively and with assistance. Came  into sitting in the TIS w/c and participated with brushing teeth with suction and washing face. Pt left sitting up in the TIS with wife sitting on his left. Positioned room to promote pt visually attending to the left.  ? ?Therapy Documentation ?Precautions:  ?Precautions ?Precautions: Fall ?Precaution Comments: dense L hemipareisis with UE/LE tone, pushes to L, limited focus ?Restrictions ?Weight Bearing Restrictions: No ? ?Pain: ?No reports of pain in session; does report spasms in her right LE and tightness if left hip  ? ?Therapy/Group: Individual Therapy ? ?Nicoletta Ba ?12/10/2021, 9:27 AM ?

## 2021-12-10 NOTE — Care Management (Signed)
[facility]patient [facility] Center ?Individual Statement of Services ? ?Patient Name:  Patrick Rose  ?Date:  12/10/2021 ? ?Welcome to the Inpatient Rehabilitation Center.  Our goal is to provide you with an individualized program based on your diagnosis and situation, designed to meet your specific needs.  With this comprehensive rehabilitation program, you will be expected to participate in at least 3 hours of rehabilitation therapies Monday-Friday, with modified therapy programming on the weekends. ? ?Your rehabilitation program will include the following services:  Physical Therapy (PT), Occupational Therapy (OT), Speech Therapy (ST), 24 hour per day rehabilitation nursing, Therapeutic Recreaction (TR), Psychology, Neuropsychology, Care Coordinator, Rehabilitation Medicine, Nutrition Services, Pharmacy Services, and Other ? ?Weekly team conferences will be held on Tuesdays to discuss your progress.  Your Inpatient Rehabilitation Care Coordinator will talk with you frequently to get your input and to update you on team discussions.  Team conferences with you and your family in attendance may also be held. ? ?Expected length of stay: 21-30 days   ? ?Overall anticipated outcome: Minimal Assistance ? ?Depending on your progress and recovery, your program may change. Your Inpatient Rehabilitation Care Coordinator will coordinate services and will keep you informed of any changes. Your Inpatient Rehabilitation Care Coordinator's name and contact numbers are listed  below. ? ?The following services may also be recommended but are not provided by the Inpatient Rehabilitation Center:  ?Driving Evaluations ?Home Health Rehabiltiation Services ?Outpatient Rehabilitation Services ?Vocational Rehabilitation ?  ?Arrangements will be made to provide these services after discharge if needed.  Arrangements include referral to agencies that provide these services. ? ?Your insurance has been verified to be:  [employer] ? ?Your  primary doctor is: Dola Factor ? ?Pertinent information will be shared with your doctor and your insurance company. ? ?Inpatient Rehabilitation Care Coordinator:  Susie Cassette [phone] or (C) [phone] ? ?Information discussed with and copy given to patient by: Gretchen Short, 12/10/2021, 9:16 AM    ?

## 2021-12-11 DIAGNOSIS — I63511 Cerebral infarction due to unspecified occlusion or stenosis of right middle cerebral artery: Secondary | ICD-10-CM | POA: Diagnosis not present

## 2021-12-11 DIAGNOSIS — I482 Chronic atrial fibrillation, unspecified: Secondary | ICD-10-CM | POA: Diagnosis not present

## 2021-12-11 DIAGNOSIS — G8114 Spastic hemiplegia affecting left nondominant side: Secondary | ICD-10-CM | POA: Diagnosis not present

## 2021-12-11 DIAGNOSIS — K5901 Slow transit constipation: Secondary | ICD-10-CM | POA: Diagnosis not present

## 2021-12-11 MED ORDER — CAMPHOR-MENTHOL 0.5-0.5 % EX LOTN
TOPICAL_LOTION | CUTANEOUS | Status: DC | PRN
  Filled 2021-12-11: qty 222

## 2021-12-11 MED ORDER — HYDROCORTISONE 1 % EX CREA
TOPICAL_CREAM | Freq: Two times a day (BID) | CUTANEOUS | Status: DC
  Administered 2021-12-31: 1 via TOPICAL
  Administered 2022-01-01: 2 via TOPICAL
  Filled 2021-12-11 (×2): qty 28

## 2021-12-11 MED ORDER — PREDNISONE 10 MG PO TABS
10.0000 mg | ORAL_TABLET | Freq: Every day | ORAL | Status: AC
  Administered 2021-12-12: 10 mg via ORAL
  Filled 2021-12-11: qty 1

## 2021-12-11 MED ORDER — PREDNISONE 10 MG PO TABS
10.0000 mg | ORAL_TABLET | Freq: Two times a day (BID) | ORAL | Status: AC
  Administered 2021-12-11 (×2): 10 mg via ORAL
  Filled 2021-12-11 (×2): qty 1

## 2021-12-11 NOTE — Progress Notes (Signed)
Speech Language Pathology Daily Session Note ? ?Patient Details  ?Name: Patrick Rose ?MRN: [id number] ?Date of Birth: Mar 09, 1951 ? ?Today's Date: 12/11/2021 ?SLP Individual Time: [id number] ?SLP Individual Time Calculation (min): 50 min ? ?Short Term Goals: ?Week 1: SLP Short Term Goal 1 (Week 1): Pt will consume current diet Dys 2 textures and Nectar thick liquids with mod A verbal cues to correct left spillage and pocketing. ?SLP Short Term Goal 2 (Week 1): Pt will consume thin liquid trials via cup with minimal s/s aspiration piror instrumental swallow assessment. ?SLP Short Term Goal 3 (Week 1): Pt will complete mildly complex problem solving tasks with min A verbal cues. ?SLP Short Term Goal 4 (Week 1): Pt will demonstrate verbal and functional error awareness with mod A verbal cues. ?SLP Short Term Goal 5 (Week 1): Pt will demonstrate sustained attention in functional tasks in 10-15 minute intervals with mod A verbal cues. ?SLP Short Term Goal 6 (Week 1): Pt will demonstrate use of speech intelligibility strategies at the sentence level producing 80% intelligibility with min A verbal cues. ? ?Skilled Therapeutic Interventions: Skilled treatment session focused on dysphagia and cognitive goals. Upon arrival, patient was asleep in bed and required Max multimodal cues for arousal. SLP facilitated session by providing skilled observation with breakfast meal of Dys. 2 textures with nectar-thick liquids. Patient self-fed meal and required Max verbal cues for sustained attention to task. Mod-Max verbal cues were also needed for use of a slow pace, minimizing talking with a full oral cavity and to self-monitor and correct left buccal pocketing. Only one overt coughing episode noted, suspect due to talking with a full oral cavity. Recommend patient continue current diet with full supervision to maximize utilization of swallowing compensatory strategies. Throughout meal, patient able to locate items on tray in left visual  field with Min-Mod verbal cues. Patient left upright in bed with alarm on and all needs within reach.  ?   ? ?Pain ?No/Denies Pain  ? ?Therapy/Group: Individual Therapy ? ?Indica Marcott ?12/11/2021, 12:41 PM ?

## 2021-12-11 NOTE — Progress Notes (Signed)
Physical Therapy Session Note ? ?Patient Details  ?Name: Patrick Rose ?MRN: [id number] ?Date of Birth: 1951-05-22 ? ?Today's Date: 12/11/2021 ?PT Individual Time: 3734-2876 ?PT Individual Time Calculation (min): 45 min  ? ?Short Term Goals: ?Week 1:  PT Short Term Goal 1 (Week 1): Pt will perform bed mobility with consistent MaxA and imrproved ability to follow instructions. ?PT Short Term Goal 2 (Week 1): Pt will perform sit<>stand with consistent Mod/ Max +2. ?PT Short Term Goal 3 (Week 1): Pt will perform seat to seat transfers with consistent MaxA +2 ?PT Short Term Goal 4 (Week 1): Pt will demonstrate improved balance with decreased pushing to L side to <50%. ? ?Skilled Therapeutic Interventions/Progress Updates:  ? ?LLE AAROM for hip/knee flexion/extension x 20. Hip/pelvic rotation R and L with cues for increased ROM and increased R Rotation to reduce pushers response 2 x 2 min; noted to have intermittent flexor tone with rotation to the R beyond neutral. UE ER and shoulder press/retraction 3 x 12 within available range with noted activation in flexion synergy intermittently. Hip abduction stretch 2 x 1 min . Noted bowel incontinence. NT notified. Left supine in bed with wife present.    ? ? ? ?   ? ?Therapy Documentation ?Precautions:  ?Precautions ?Precautions: Fall ?Precaution Comments: dense L hemipareisis with UE/LE tone, pushes to L, limited focus ?Restrictions ?Weight Bearing Restrictions: No ? ?  ?Vital Signs: ?Therapy Vitals ?Temp: 98.4 ?F (36.9 ?C) ?Pulse Rate: 76 ?Resp: 18 ?BP: 124/66 ?Patient Position (if appropriate): Lying ?Oxygen Therapy ?SpO2: 95 % ?O2 Device: Room Air ?Pain: ?Pain Assessment ?Pain Scale: 0-10 ?Pain Score: 5  ?Pain Type: Chronic pain ?Pain Location: Hip ?Pain Orientation: Left ?Pain Descriptors / Indicators: Aching ?Pain Intervention(s): Repositioned ? ? ? ?Therapy/Group: Individual Therapy ? ?Golden Pop ?12/11/2021, 5:42 PM  ?

## 2021-12-11 NOTE — Progress Notes (Signed)
Occupational Therapy Session Note ? ?Patient Details  ?Name: Patrick Rose ?MRN: [id number] ?Date of Birth: 1950/10/15 ? ?Session 1 ?Today's Date: 12/11/2021 ?OT Individual Time: 1740-8144 ?OT Individual Time Calculation (min): 70 min  ? ?Session 2 ?OT Individual Time: 8185-6314 ?OT Individual Time Calculation (min): 45 min  ? ? ?Short Term Goals: ?Week 1:  OT Short Term Goal 1 (Week 1): Pt will maintain static sitting balance with max cueing and mod A ?OT Short Term Goal 2 (Week 1): Pt will don a shirt with mod A ?OT Short Term Goal 3 (Week 1): Pt will complete a sit <> stand with LRAD with max A of 1 ?OT Short Term Goal 4 (Week 1): Pt will demo improved sustained attention, requiring only mod cueing to attend to one grooming tasks ? ?Skilled Therapeutic Interventions/Progress Updates:  ?  Pt received supine with no c/o pain, agreeable to OT session. Session focused on LUE activation, L attention, focused/sustained, midline awareness, and sitting balance/transfers. He required MAX multimodal cueing for focused attention to task and was verbose throughout session. His wife was present throughout session. Pt was assisted into L sidelying to promote visual attention to his LUE and to assist with his severe R neck rotation/gaze preference. He was able to attend to his RUE for 10 seconds max. Suspect pt has volitional movement in his LUE but d/t profound attention deficits he is unable to activate. He did demonstrate twitching of his 1st and 2nd fingers, as well as palpable bicep activation but difficult to discern volitional movement vs tone. He was brought through PROM for muscle length maintenance. Pt required total A for peri hygiene at bed level with incontinent small loose BM. Shorts donned with total A. Supine > EOB sitting with total A +2. Heavy facilitation required to reduce R pushing and facilitate midline orientation. Intermittent periods of only mod A sitting balance, an improvement from last session. He used  the SARA to stand. Once standing with full posterior support from the sling he required max multimodal cueing to bring head to midline and was only able to sustain this posture for 10-15 sec before turning his head. Pt was transferred to the TIS w/c. Total A +2 for scooting his hips back in the chair. He changed his shirt with total A for problem solving hemi technique. Pt was left sitting up in the TIS with all needs met, chair alarm set, and call bell within reach. Wife present.  ? ? ?Session 2 ?Pt received sitting in the w/c with no c/o pain. He was agreeable to OT session. Session focused on L attention, midline awareness, reduction of R pushing, and transfers. Slideboard placed with increased time/problem solving required to allow pt to initiate transfer/activation of trunk in midline and hips activation to scoot. He required max-total A of 2 skilled Ot' d/t severe pushing and inattention/distraction. Once supine he required total A +2 for brief change d/t incontinent BM. Pt was left supine with all needs met, bed alarm set. PA alerted to rash on pt.  ? ?Saebo Stim One was placed on his L tricep for muscle activation. He had great activation overall with 20 degrees of tricep extension with each ramp on. Saebo removed with no adverse skin reaction or c/o pain.  ?330 pulse width ?35 Hz pulse rate ?On 8 sec/ off 8 sec ?Ramp up/ down 2 sec ?Symmetrical Biphasic wave form  ?Max intensity 159m at 500 Ohm load ? ? ? ?Therapy Documentation ?Precautions:  ?Precautions ?Precautions: Fall ?  Precaution Comments: dense L hemipareisis with UE/LE tone, pushes to L, limited focus ?Restrictions ?Weight Bearing Restrictions: No ? ?Therapy/Group: Individual Therapy ? ?Curtis Sites ?12/11/2021, 6:21 AM ?

## 2021-12-11 NOTE — Progress Notes (Signed)
?                                                       PROGRESS NOTE ? ? ?Subjective/Complaints: ?Pt getting up with OT. Rash on back with associated pruritus. Perhaps a little sleepier this morning ? ?ROS: Patient denies fever, rash, sore throat, blurred vision, dizziness, nausea, vomiting, diarrhea, cough, shortness of breath or chest pain, joint or back/neck pain, headache, or mood change.  ? ? ?Objective: ?  ?VAS [place] LOWER EXTREMITY VENOUS (DVT) ? ?Result Date: 12/09/2021 ? Lower Venous DVT Study Patient Name:  Patrick Rose Devereux  Date of Exam:   12/09/2021 Medical Rec #: [id number]       Accession #:    [id number] Date of Birth: [birth year]       Patient Gender: M Patient Age:   71 years Exam Location:  [facility] Procedure:      VAS [place] LOWER EXTREMITY VENOUS (DVT) Referring Phys: PAMELA LOVE --------------------------------------------------------------------------------  Indications: Stroke.  Comparison Study: No previous exams Performing Technologist: Jody Hill RVT, RDMS  Examination Guidelines: A complete evaluation includes B-mode imaging, spectral Doppler, color Doppler, and power Doppler as needed of all accessible portions of each vessel. Bilateral testing is considered an integral part of a complete examination. Limited examinations for reoccurring indications may be performed as noted. The reflux portion of the exam is performed with the patient in reverse Trendelenburg.  +---------+---------------+---------+-----------+----------+--------------+ RIGHT    CompressibilityPhasicitySpontaneityPropertiesThrombus Aging +---------+---------------+---------+-----------+----------+--------------+ CFV      Full           Yes      Yes                                 +---------+---------------+---------+-----------+----------+--------------+ SFJ      Full                                                        +---------+---------------+---------+-----------+----------+--------------+ FV Prox   Full           Yes      Yes                                 +---------+---------------+---------+-----------+----------+--------------+ FV Mid   Full           Yes      Yes                                 +---------+---------------+---------+-----------+----------+--------------+ FV DistalFull           Yes      Yes                                 +---------+---------------+---------+-----------+----------+--------------+ PFV      Full                                                        +---------+---------------+---------+-----------+----------+--------------+  POP      Full           Yes      Yes                                 +---------+---------------+---------+-----------+----------+--------------+ PTV      Full                                                        +---------+---------------+---------+-----------+----------+--------------+ PERO     Full                                                        +---------+---------------+---------+-----------+----------+--------------+   +---------+---------------+---------+-----------+----------+--------------+ LEFT     CompressibilityPhasicitySpontaneityPropertiesThrombus Aging +---------+---------------+---------+-----------+----------+--------------+ CFV      Full           Yes      Yes                                 +---------+---------------+---------+-----------+----------+--------------+ SFJ      Full                                                        +---------+---------------+---------+-----------+----------+--------------+ FV Prox  Full           Yes      Yes                                 +---------+---------------+---------+-----------+----------+--------------+ FV Mid   Full           Yes      Yes                                 +---------+---------------+---------+-----------+----------+--------------+ FV DistalFull           Yes      Yes                                  +---------+---------------+---------+-----------+----------+--------------+ PFV      Full                                                        +---------+---------------+---------+-----------+----------+--------------+ POP      Full           Yes      Yes                                 +---------+---------------+---------+-----------+----------+--------------+ PTV  Full                                                        +---------+---------------+---------+-----------+----------+--------------+ PERO     Full                                                        +---------+---------------+---------+-----------+----------+--------------+     Summary: BILATERAL: - No evidence of deep vein thrombosis seen in the lower extremities, bilaterally. - No evidence of superficial venous thrombosis in the lower extremities, bilaterally. -No evidence of popliteal cyst, bilaterally.   *See table(s) above for measurements and observations. Electronically signed by Orlie Pollen on 12/09/2021 at 4:45:06 PM.    Final    ?Recent Labs  ?  12/09/21 ?0510  ?WBC 9.0  ?HGB 13.8  ?HCT 40.4  ?PLT 235  ? ?Recent Labs  ?  12/09/21 ?0510  ?NA 135  ?K 3.8  ?CL 107  ?CO2 21*  ?GLUCOSE 124*  ?BUN 19  ?CREATININE 0.90  ?CALCIUM 8.9  ? ? ?Intake/Output Summary (Last 24 hours) at 12/11/2021 1039 ?Last data filed at 12/11/2021 0800 ?Gross per 24 hour  ?Intake 120 ml  ?Output 600 ml  ?Net -480 ml  ?  ? ?  ? ?Physical Exam: ?Vital Signs ?Blood pressure 118/84, pulse 72, temperature (!) 97.4 ?F (36.3 ?C), resp. rate 18, height 6\' 1"  (1.854 m), SpO2 93 %. ? ?Constitutional: No distress . Vital signs reviewed. ?HEENT: NCAT, EOMI, oral membranes moist ?Neck: supple ?Cardiovascular: RRR without murmur. No JVD    ?Respiratory/Chest: CTA Bilaterally without wheezes or rales. Normal effort    ?GI/Abdomen: BS +, non-tender, non-distended ?Ext: no clubbing, cyanosis, or edema ?Psych: pleasant and cooperative    ?Skin: maculopapular rash along back. Multiple bruises Right thigh upper exts.  ?Neuro:  Pt is alert to person, place, reason. Follows simple commands. Left central 7 and left hemianopsia. Dysarthric. LUE and LLE 0/5 with significant flexor tone LUE  1/4, and flex/ext tone LLE 2-3/4. Left SCM also tight 2/4.  DTR's 3+ on left ?Musculoskeletal: mild left hip pain, no limb pain with palpation  ? ? ?Assessment/Plan: ?1. Functional deficits which require 3+ hours per day of interdisciplinary therapy in a comprehensive inpatient rehab setting. ?Physiatrist is providing close team supervision and 24 hour management of active medical problems listed below. ?Physiatrist and rehab team continue to assess barriers to discharge/monitor patient progress toward functional and medical goals ? ?Care Tool: ? ?Bathing ?   ?Body parts bathed by patient: Chest, Abdomen, Front perineal area, Right upper leg, Left upper leg, Face  ? Body parts bathed by helper: Buttocks, Right arm, Left arm, Right lower leg, Left lower leg ?  ?  ?Bathing assist Assist Level: 2 Helpers ?  ?  ?Upper Body Dressing/Undressing ?Upper body dressing   ?What is the patient wearing?: Pull over shirt ?   ?Upper body assist Assist Level: Maximal Assistance - Patient 25 - 49% ?   ?Lower Body Dressing/Undressing ?Lower body dressing ? ? ?   ?What is the patient wearing?: Pants ? ?  ? ?Lower body assist Assist for lower body dressing: Total Assistance -  Patient < 25% ?   ? ?Toileting ?Toileting    ?Toileting assist Assist for toileting: Dependent - Patient 0% ?  ?  ?Transfers ?Chair/bed transfer ? ?Transfers assist ? Chair/bed transfer activity did not occur: Safety/medical concerns ? ?Chair/bed transfer assist level: 2 Helpers ?  ?  ?Locomotion ?Ambulation ? ? ?Ambulation assist ? ? Ambulation activity did not occur: Safety/medical concerns ? ?  ?  ?   ? ?Walk 10 feet activity ? ? ?Assist ? Walk 10 feet activity did not occur: Safety/medical concerns ? ?  ?    ? ?Walk 50 feet activity ? ? ?Assist Walk 50 feet with 2 turns activity did not occur: Safety/medical concerns ? ?  ?   ? ? ?Walk 150 feet activity ? ? ?Assist Walk 150 feet activity did not occur: Safety/medical concerns ? ?  ?  ?  ? ?Walk 10 feet on uneven surface  ?activity ? ? ?Assist Walk 10 feet on uneven surfaces activity did not occur

## 2021-12-11 NOTE — IPOC Note (Signed)
Overall Plan of Care (IPOC) ?Patient Details ?Name: Patrick Rose ?MRN: [id number] ?DOB: [birth year] ? ?Admitting Diagnosis: Acute ischemic right MCA stroke ([place]) ? ?Hospital Problems: Principal Problem: ?  Acute ischemic right MCA stroke ([place]) ? ? ? ? Functional Problem List: ?Nursing Bladder, Bowel, Edema, Endurance, Safety, Skin Integrity  ?PT Balance, Behavior, Endurance, Motor, Nutrition, Pain, Perception, Safety, Sensory  ?OT Balance, Perception, Safety, Cognition, Sensory, Skin Integrity, Endurance, Vision, Motor, Pain  ?SLP Cognition  ?TR    ?    ? Basic ADL?s: ?OT Eating, Grooming, Bathing, Toileting, Dressing  ? ?  Advanced  ADL?s: ?OT    ?   ?Transfers: ?PT Bed Mobility, Bed to Chair, Car, Furniture  ?OT Toilet, Tub/Shower  ? ?  Locomotion: ?PT Ambulation, Wheelchair Mobility, Stairs  ? ?  Additional Impairments: ?OT Fuctional Use of Upper Extremity  ?SLP Swallowing, Communication, Social Cognition ?expression ?Problem Solving, Memory, Attention, Awareness  ?TR    ? ? ?Anticipated Outcomes ?Item Anticipated Outcome  ?Self Feeding (S)  ?Swallowing ? Mod I ?  ?Basic self-care ? min A, mod A LB dressing  ?Toileting ? min A ?  ?Bathroom Transfers min A  ?Bowel/Bladder ? min assist  ?Transfers ? MinA  ?Locomotion ? MinA  ?Communication ? Supervision A  ?Cognition ? Min A  ?Pain ? n/a  ?Safety/Judgment ? min assist  ? ?Therapy Plan: ?PT Intensity: Minimum of 1-2 x/day ,45 to 90 minutes ?PT Frequency: 5 out of 7 days ?PT Duration Estimated Length of Stay: 21-27 days ?OT Intensity: Minimum of 1-2 x/day, 45 to 90 minutes ?OT Frequency: 5 out of 7 days ?OT Duration/Estimated Length of Stay: 4 weeks ?SLP Intensity: Minumum of 1-2 x/day, 30 to 90 minutes ?SLP Frequency: 3 to 5 out of 7 days ?SLP Duration/Estimated Length of Stay: 4 weeks  ? ?Due to the current state of emergency, patients may not be receiving their 3-hours of Medicare-mandated therapy. ? ? Team Interventions: ?Nursing Interventions Bladder Management,  Bowel Management, Patient/Family Education, Disease Management/Prevention, Medication Management, Skin Care/Wound Management, Discharge Planning  ?PT interventions Ambulation/gait training, Discharge planning, Functional mobility training, Psychosocial support, Therapeutic Activities, Visual/perceptual remediation/compensation, Balance/vestibular training, Disease management/prevention, Neuromuscular re-education, Skin care/wound management, Therapeutic Exercise, Wheelchair propulsion/positioning, UE/LE Strength taining/ROM, Splinting/orthotics, Pain management, DME/adaptive equipment instruction, Cognitive remediation/compensation, Community reintegration, Functional electrical stimulation, Patient/family education, Stair training, UE/LE Coordination activities  ?OT Interventions Balance/vestibular training, Discharge planning, Functional electrical stimulation, Pain management, Self Care/advanced ADL retraining, UE/LE Coordination activities, Therapeutic Activities, Therapeutic Exercise, Skin care/wound managment, Cognitive remediation/compensation, Functional mobility training, Patient/family education, Visual/perceptual remediation/compensation, Wheelchair propulsion/positioning, UE/LE Strength taining/ROM, Splinting/orthotics, Psychosocial support, Neuromuscular re-education, DME/adaptive equipment instruction, Community reintegration  ?SLP Interventions Cognitive remediation/compensation, Cueing hierarchy, Dysphagia/aspiration precaution training, Functional tasks, Medication managment, Patient/family education, Internal/external aids, Speech/Language facilitation  ?TR Interventions    ?SW/CM Interventions Psychosocial Support, Discharge Planning, Patient/Family Education  ? ?Barriers to Discharge ?MD  Medical stability  ?Nursing Decreased caregiver support, Home environment access/layout, Incontinence, Wound Care, Weight, Medication compliance ?1 level, 8 steps, 2 rails. Spouse to provide 24/7 care.  ?PT  Inaccessible home environment, Incontinence, Neurogenic Bowel & Bladder, Medication compliance, Wound Care, Decreased caregiver support, Home environment access/layout, Lack of/limited family support, Behavior, Nutrition means, Insurance for SNF coverage, Weight ?   ?OT   ?   ?SLP   ?   ?SW Decreased caregiver support, Lack of/limited family support ?   ? ?Team Discharge Planning: ?Destination: PT-Home ,OT- Home , SLP-Home ?Projected Follow-up: PT-None, OT-  Home health OT,  SLP-24 hour supervision/assistance, Home Health SLP, Outpatient SLP ?Projected Equipment Needs: PT-To be determined, OT- To be determined, SLP-None recommended by SLP ?Equipment Details: PT- , OT-  ?Patient/family involved in discharge planning: PT- Patient,  OT-Patient, SLP-Family member/caregiver ? ?MD ELOS: 3-4 weeks ?Medical Rehab Prognosis:  Excellent ?Assessment: The patient has been admitted for CIR therapies with the diagnosis of right MCA infarct, pre-existing spastic left hemiparesis. The team will be addressing functional mobility, strength, stamina, balance, safety, adaptive techniques and equipment, self-care, bowel and bladder mgt, patient and caregiver education, NMR, spasticity mgt, cognition, communication. Goals have been set at min assist for mobility, self-care, sup/min with communication and cognition. Anticipated discharge destination is home with wife. ? ?Due to the current state of emergency, patients may not be receiving their 3 hours per day of Medicare-mandated therapy.  ? ? ?Meredith Staggers, MD, FAAPMR   ? ? ?See Team Conference Notes for weekly updates to the plan of care  ?

## 2021-12-11 NOTE — Progress Notes (Signed)
Patient with worsening rash with pruritis--some welts noted right flank. Discussed meds with PharmD-->likely due to Eliquis started on 04/04. Could trial  Xarelto but can develop rash up to 30 days post initiation or use coumadin. He suggested getting neuro input. Discussed with Dr. Riley Kill. Will treat with prednisone x 2 days and well as med change. Dr. Pearlean Brownie paged for input.  ? ?Right buttock ? ? ?Right flank ? ? ?

## 2021-12-11 NOTE — Progress Notes (Signed)
Pt. Report itching on lower back with rash and redness noted upon assessment. Sent secure message to Jesusita Oka A (PA) for evaluation. Passed on to morning shift nurse to follow up. ?

## 2021-12-12 DIAGNOSIS — I63511 Cerebral infarction due to unspecified occlusion or stenosis of right middle cerebral artery: Secondary | ICD-10-CM | POA: Diagnosis not present

## 2021-12-12 MED ORDER — BACLOFEN 5 MG HALF TABLET
5.0000 mg | ORAL_TABLET | Freq: Every day | ORAL | Status: DC
  Administered 2021-12-12 – 2021-12-14 (×3): 5 mg via ORAL
  Filled 2021-12-12 (×3): qty 1

## 2021-12-12 NOTE — Progress Notes (Signed)
Occupational Therapy Session Note ? ?Patient Details  ?Name: Patrick Rose ?MRN: [id number] ?Date of Birth: Jun 02, 1951 ? ?Today's Date: 12/12/2021 ?OT Individual Time: 6195-0932 ?OT Individual Time Calculation (min): 45 min  ? ? ?Today's Date: 12/12/2021 ?OT Individual Time: 1030-1130 ?OT Individual Time Calculation (min): 60 min  ? ?Short Term Goals: ?Week 1:  OT Short Term Goal 1 (Week 1): Pt will maintain static sitting balance with max cueing and mod A ?OT Short Term Goal 2 (Week 1): Pt will don a shirt with mod A ?OT Short Term Goal 3 (Week 1): Pt will complete a sit <> stand with LRAD with max A of 1 ?OT Short Term Goal 4 (Week 1): Pt will demo improved sustained attention, requiring only mod cueing to attend to one grooming tasks ? ?Skilled Therapeutic Interventions/Progress Updates:  ?   ?Pt received in bed with hip pain with flexor spasm in L hip  ?ADL: ?Pt completes rolling with total A using bed sheet for dressing in prep for sitting EOB. ? ?Therapeutic activity ?Sup>sit with total A from hooklying with 1 person. Static sitting balance EOB with wife holding mirror in front of pt. Frequent multimodal cuing for sitting balance and correction. Pt able to hold statically at midline up to 45 seconds with cuing for visual attention and no talking d/t tangential nature. Pt with multiple total LOB in all directions. Pt and OT compelte weight shifts in all directions in min ranges with improved ability to find midline after. ? ?Saebo Stim One on deltoid with good contraction +7 clicks to decrease risk of subluxation/approximate the shoulder ?330 pulse width ?35 Hz pulse rate ?On 8 sec/ off 8 sec ?Ramp up/ down 2 sec ?Symmetrical Biphasic wave form  ?Max intensity 169m at 500 Ohm load ? ? ?Pt [facility] end of session [facility] for oral care with exit alarm on, call light in reach and all needs met ? ?Session 2: ? ?Pt received in bed with same hip pain in L hip. Repositioning provided for pain relief in supine  at end of session ? ?Neuromuscular Reeducation ?Pt received in bed asleep with saebo still stimulating 15 min of session. Removed after with skin in tact and no redness/pain. Pt positioned in sidelying on R with total A and pillow under hips to keep in sidelying and pillow in between knees for comfort. Provided scap mobilizations in all directions but pt unable to attend to commands to trial arm movements d/t increased spasms in legs with B knees bent. Pt rolls onto back with total A for positioning in supine and pt with no spasms/able to attend to LUE with MAX cuing. Pt able to complete AAROM of bicep with gravity assisted and min resistance from OT ~10x! Pt fatigued and falling asleep at end of session.  ? ? ?Pt left at end of session in bed with exit alarm on, call light in reach and all needs met ? ? ?Therapy Documentation ?Precautions:  ?Precautions ?Precautions: Fall ?Precaution Comments: dense L hemipareisis with UE/LE tone, pushes to L, limited focus ?Restrictions ?Weight Bearing Restrictions: No ?General: ?  ? ?Therapy/Group: Individual Therapy ? ?Patrick Rose ?12/12/2021, 6:44 AM ?

## 2021-12-12 NOTE — Progress Notes (Addendum)
Speech Language Pathology Daily Session Note ? ?Patient Details  ?Name: Patrick Rose ?MRN: [id number] ?Date of Birth: 05-31-1951 ? ?Today's Date: 12/12/2021 ?SLP Individual Time: 4098-1191 ?SLP Individual Time Calculation (min): 30 min ? ?Short Term Goals: ?Week 1: SLP Short Term Goal 1 (Week 1): Pt will consume current diet Dys 2 textures and Nectar thick liquids with mod A verbal cues to correct left spillage and pocketing. ?SLP Short Term Goal 2 (Week 1): Pt will consume thin liquid trials via cup with minimal s/s aspiration piror instrumental swallow assessment. ?SLP Short Term Goal 3 (Week 1): Pt will complete mildly complex problem solving tasks with min A verbal cues. ?SLP Short Term Goal 4 (Week 1): Pt will demonstrate verbal and functional error awareness with mod A verbal cues. ?SLP Short Term Goal 5 (Week 1): Pt will demonstrate sustained attention in functional tasks in 10-15 minute intervals with mod A verbal cues. ?SLP Short Term Goal 6 (Week 1): Pt will demonstrate use of speech intelligibility strategies at the sentence level producing 80% intelligibility with min A verbal cues. ? ?Skilled Therapeutic Interventions: ?Pt seen this date for skilled ST intervention targeting dysphagia and cognitive-linguistic goals outlined above. Pt encountered sleeping in bed; hard L lean. SLP attempted to improve pt's awareness of midline via Max multimodal A to include use of mirror with no improvement. Total A provided for repositioning to as close to midline as possible. Aroused to voice. Agreeable to ST intervention at bedside. ? ?SLP facilitated dysphagia intervention by providing Set-up A for oral care, verbal education re: rationale + importance of completing oral care, recommendations for thickened liquids, and swallow study findings, in addition to therapeutic trials of ice chips. Pt continues to exhibit wet vocal quality at baseline likely due to decreased secretion management. Following oral care, pt  consumed ice chips with intermittent throat clear. Following ice chip trials, nectar-thick liquid provided. Pt tolerated nectar-thick liquid from a pharyngeal standpoint; however, L anterior oral spillage noted with large bolus size and L lean. Pt educated re: the importance of small sip sizes. ? ?SLP facilitated cognitive-linguistic intervention by providing Max verbal and visual A for orientation to date. Demonstrated sustained attention to orientation and ice chip trials for up to 15 minutes without need for redirection. Pt unable to recall functional information re: care and required Total A.  ? ?Session concluded with pt in bed, bed alarm on, call bell within reach, and all immediate needs met. NT notified of pt wanting to eat and needing full supervision at this time. Continue per current ST POC. ? ?Pain ?No/Denies pain; NAD ? ?Therapy/Group: Individual Therapy ? ?Aniesha Haughn A Arlinda Barcelona ?12/12/2021, 1:43 PM ?

## 2021-12-12 NOTE — Progress Notes (Signed)
?                                                       PROGRESS NOTE ? ? ?Subjective/Complaints: ?No new complaints this morning ?Somnolent ?Wife at bedside notes he has been having hiccups: discussed starting baclofen HS ? ?ROS: Patient denies fever, rash, sore throat, blurred vision, dizziness, nausea, vomiting, diarrhea, cough, shortness of breath or chest pain, joint or back/neck pain, headache, or mood change. +hiccups  ? ? ?Objective: ?  ?No results found. ?No results for input(s): WBC, HGB, HCT, PLT in the last 72 hours. ? ?No results for input(s): NA, K, CL, CO2, GLUCOSE, BUN, CREATININE, CALCIUM in the last 72 hours. ? ? ?Intake/Output Summary (Last 24 hours) at 12/12/2021 1412 ?Last data filed at 12/12/2021 1300 ?Gross per 24 hour  ?Intake 240 ml  ?Output 750 ml  ?Net -510 ml  ?  ? ?  ? ?Physical Exam: ?Vital Signs ?Blood pressure 121/63, pulse 69, temperature 98 ?F (36.7 ?C), temperature source Oral, resp. rate 18, height 6\' 1"  (1.854 m), SpO2 94 %. ?Gen: no distress, normal appearing ?HEENT: oral mucosa pink and moist, NCAT ?Cardio: Reg rate ?Chest: normal effort, normal rate of breathing ?Abd: soft, non-distended ?Ext: no edema ?Psych: pleasant, normal affect ?Skin: maculopapular rash along back. Multiple bruises Right thigh upper exts.  ?Neuro:  Pt is alert to person, place, reason. Follows simple commands. Left central 7 and left hemianopsia. Dysarthric. LUE and LLE 0/5 with significant flexor tone LUE  1/4, and flex/ext tone LLE 2-3/4. Left SCM also tight 2/4.  DTR's 3+ on left ?Musculoskeletal: mild left hip pain, no limb pain with palpation  ? ? ?Assessment/Plan: ?1. Functional deficits which require 3+ hours per day of interdisciplinary therapy in a comprehensive inpatient rehab setting. ?Physiatrist is providing close team supervision and 24 hour management of active medical problems listed below. ?Physiatrist and rehab team continue to assess barriers to discharge/monitor patient progress toward  functional and medical goals ? ?Care Tool: ? ?Bathing ? Bathing activity did not occur: Safety/medical concerns ?Body parts bathed by patient: Chest, Abdomen, Front perineal area, Right upper leg, Left upper leg, Face  ? Body parts bathed by helper: Buttocks, Right arm, Left arm, Right lower leg, Left lower leg ?  ?  ?Bathing assist Assist Level: 2 Helpers ?  ?  ?Upper Body Dressing/Undressing ?Upper body dressing Upper body dressing/undressing activity did not occur (including orthotics): Safety/medical concerns ?What is the patient wearing?: Pull over shirt ?   ?Upper body assist Assist Level: Maximal Assistance - Patient 25 - 49% ?   ?Lower Body Dressing/Undressing ?Lower body dressing ? ? ?   ?What is the patient wearing?: Pants ? ?  ? ?Lower body assist Assist for lower body dressing: Total Assistance - Patient < 25% ?   ? ?Toileting ?Toileting    ?Toileting assist Assist for toileting: Dependent - Patient 0% ?  ?  ?Transfers ?Chair/bed transfer ? ?Transfers assist ? Chair/bed transfer activity did not occur: Safety/medical concerns ? ?Chair/bed transfer assist level: 2 Helpers ?  ?  ?Locomotion ?Ambulation ? ? ?Ambulation assist ? ? Ambulation activity did not occur: Safety/medical concerns ? ?  ?  ?   ? ?Walk 10 feet activity ? ? ?Assist ? Walk 10 feet activity did not occur: Safety/medical concerns ? ?  ?   ? ?  Walk 50 feet activity ? ? ?Assist Walk 50 feet with 2 turns activity did not occur: Safety/medical concerns ? ?  ?   ? ? ?Walk 150 feet activity ? ? ?Assist Walk 150 feet activity did not occur: Safety/medical concerns ? ?  ?  ?  ? ?Walk 10 feet on uneven surface  ?activity ? ? ?Assist Walk 10 feet on uneven surfaces activity did not occur: Safety/medical concerns ? ? ?  ?   ? ?Wheelchair ? ? ? ? ?Assist Is the patient using a wheelchair?: Yes ?Type of Wheelchair: Manual ?Wheelchair activity did not occur: Safety/medical concerns ? ?  ?   ? ? ?Wheelchair 50 feet with 2 turns activity ? ? ? ?Assist ? ?   ?Wheelchair 50 feet with 2 turns activity did not occur: Safety/medical concerns ? ? ?   ? ?Wheelchair 150 feet activity  ? ? ? ?Assist ? Wheelchair 150 feet activity did not occur: Safety/medical concerns ? ? ?   ? ?Blood pressure 121/63, pulse 69, temperature 98 ?F (36.7 ?C), temperature source Oral, resp. rate 18, height 6\' 1"  (1.854 m), SpO2 94 %. ? ? Medical Problem List and Plan: ?1. Functional deficits secondary to right MCA infarct likely embolic d/t  atrial fib ?            -patient may  shower ?            -ELOS/Goals: 14-20 days, min assist with PT, OT, SLP ? -Continue CIR therapies including PT, OT, and SLP   ?2.  Antithrombotics: ?-DVT/anticoagulation:  Pharmaceutical: Other (comment)--Eliquis started on 04/04. Dopplers clean ?            -antiplatelet therapy: N/A ?3. Left hip pain/Pain Management:   ?-xr without fx,?mild HO--> focus on ROM, spasticity control ?--Tylenol prn. Voltaren gel for local measures.  ?4. Mood: LCSW to follow for evaluation and support.  ?            -antipsychotic agents: N/A ?5. Neuropsych: This patient maybe intermittently capable of making decisions on his own behalf. ?6. Skin/Wound Care: Routine pressure relief measures.  ? -hydrocortisone for rash on back ?7. Fluids/Electrolytes/Nutrition: encourage PO..   ?   ?8. A fib: Monitor HR TID-HR in 90's ?--monitor for symptoms with activity.  ?9. Dysphagia: Continue D2, nectar liquids with supervision for safety ?-encourage fluids, labs ok ?10. Hyperlipidemia: LDL- continue Crestor 20 mg  ?11. Constipation:  continue Senna S  BID ?             4/7 multiple bm's after sorbitol,etc ?12. Acute blood loss anemia: Hbg down from 14.4-->12.2 ?--Monitor for signs of bleeding. ?            --hgb up to 13.8 4/5  ?13. H/o Cervical DDD w/cord compression:  s/p ACDF with residual numbness of bilateral hands ?14. Lumbar stenosis: S/p decompression Jan 2023.  ?15. Impaired fasting glucose: Hgb A1c-5.4.  ?16. Transaminitis: mild elevation  4/5 ? -likely reactive. F/u Monday ?17. Spastic left hemiparesis ? -resting orthotics ordered, positioning/stretching by therapy ? --continue tizanidine to 2mg  TID ? -likely botox candidate as outpt ?18. Hiccups: start baclofen 5mg  HS.  ? ?  ? ?LOS: ?4 days ?A FACE TO FACE EVALUATION WAS PERFORMED ? ?Wednesday P Danicka Hourihan ?12/12/2021, 2:12 PM  ? ?  ?

## 2021-12-12 NOTE — Progress Notes (Signed)
Physical Therapy Session Note ? ?Patient Details  ?Name: Patrick Rose ?MRN: [id number] ?Date of Birth: 1951/04/16 ? ?Today's Date: 12/12/2021 ?PT Individual Time: 6314-9702 ?PT Individual Time Calculation (min): 70 min  ? ?Short Term Goals: ?Week 1:  PT Short Term Goal 1 (Week 1): Pt will perform bed mobility with consistent MaxA and imrproved ability to follow instructions. ?PT Short Term Goal 2 (Week 1): Pt will perform sit<>stand with consistent Mod/ Max +2. ?PT Short Term Goal 3 (Week 1): Pt will perform seat to seat transfers with consistent MaxA +2 ?PT Short Term Goal 4 (Week 1): Pt will demonstrate improved balance with decreased pushing to L side to <50%. ? ?Skilled Therapeutic Interventions/Progress Updates:  ?Chart reviewed and pt agreeable to therapy. Pt received semi-reclined in bed with family present and c/o pain in R hip that pt did not quantify. Session focused on posture control and NMR to increase independent bed mobility and promote functional recovery. Pt initiated session with transfer to EOB with Max A. Pt noted to be able to follow VC with moderate difficulty 2/2 inattention. Once EOB, pt required strong VC to maintain safe hand placement and avoid pushing. STEDY placed in front of pt to decrease trunk instability. Pt able to maintain static sit with RUE hand hold on STEDY and CGA for <1 minute. Pt the practiced leaning inside BOS (ant, post, right) and returning to center with mirror placed in front. Pt completed 2x8 sets per direction and required strong VCs for RUE hand placement. Pt then completed 2x8 CROM in sitting with MinA and RUE hand rail support. Pt posture noted to have L trunk lean that was not resolved with VC or TC. RN then entered room for medication. Whie in room, RN assisted with 2 x partial stands with STEDY using Max A + 2. Pt returned to bed for AAROM in UT, LUE, and LLE. Of note, pt displayed some ability to move L toes. At end of session, pt was left semi-reclined in bed  with alarm engaged, nurse call bell and all needs in reach.  ? ?Therapy Documentation ?Precautions:  ?Precautions ?Precautions: Fall ?Precaution Comments: dense L hemipareisis with UE/LE tone, pushes to L, limited focus ?Restrictions ?Weight Bearing Restrictions: No ?General: ?  ? ? ? ?Therapy/Group: Individual Therapy ? ?Perrin Maltese, PT, DPT ?12/12/2021, 2:05 PM  ?

## 2021-12-13 DIAGNOSIS — I63511 Cerebral infarction due to unspecified occlusion or stenosis of right middle cerebral artery: Secondary | ICD-10-CM | POA: Diagnosis not present

## 2021-12-13 MED ORDER — BACLOFEN 5 MG HALF TABLET
5.0000 mg | ORAL_TABLET | Freq: Every day | ORAL | Status: DC
  Administered 2021-12-13 – 2021-12-14 (×2): 5 mg via ORAL
  Filled 2021-12-13 (×2): qty 1

## 2021-12-13 NOTE — Progress Notes (Signed)
Physical Therapy Session Note ? ?Patient Details  ?Name: Patrick Rose ?MRN: [id number] ?Date of Birth: May 03, 1951 ? ?Today's Date: 12/13/2021 ?PT Individual Time: 1856-3149 ?PT Individual Time Calculation (min): 40 min  ? ?Short Term Goals: ?Week 1:  PT Short Term Goal 1 (Week 1): Pt will perform bed mobility with consistent MaxA and imrproved ability to follow instructions. ?PT Short Term Goal 2 (Week 1): Pt will perform sit<>stand with consistent Mod/ Max +2. ?PT Short Term Goal 3 (Week 1): Pt will perform seat to seat transfers with consistent MaxA +2 ?PT Short Term Goal 4 (Week 1): Pt will demonstrate improved balance with decreased pushing to L side to <50%. ? ?Skilled Therapeutic Interventions/Progress Updates:  ?   ?Patient in bed watching TV with his wife upon PT arrival. Patient alert and agreeable to PT session. Patient reported 6/10 L hip pain during session, RN made aware. PT provided repositioning, rest breaks, and distraction as pain interventions throughout session.  ? ?Patient with questions regarding L PRAFO and WHO. Demonstrated correct donning technique and positioning for patient and his wife and educated on removal if positioning is incorrect or has moved and alerting RN to reapply. Educated on benefits of weight bearing for L lower extremity spasticity, patient agreeable to perform standing the the [place]  during session.  ? ?Retrieved Stedy, but upon return and initiation of bed mobility, patient noted to be saturated in urine with PrimoFit with leak inferiorly. Patient also with incontinent BM. NT made aware of need for new bed linens and assist with patient care. Initiated education with patient's wife, but she reports having a sensitive stomach and politely excused herself from the room.  ? ?Focused on L hemi-body mobility during clothing change and peri-care. Patient performed L knee/hip flexion with ~50% assist, mostly onset of flexor tone once initiated. Performed bridging x2 with  facilitation at patient's knee with B hip clearance to doff shorts. Performed rolling R/L with facilitation for L lower extremity, hand over hand for L upper extremity, and cues for turning his head, use of bed rails, and max cues for attention to task due to external and internal distraction throughout. Patient performed rolling R/L with mod A +2, improved L hip pain tolerance with pillow placed between his knees.  ? ?Patient with continued BM at end of session. Patient handed off to NT and LPN in bed for continued peri-care and linen change upon PT departure.  ? ?Therapy Documentation ?Precautions:  ?Precautions ?Precautions: Fall ?Precaution Comments: dense L hemipareisis with UE/LE tone, pushes to L, limited focus ?Restrictions ?Weight Bearing Restrictions: No ? ? ? ?Therapy/Group: Individual Therapy ? ?Helayne Seminole PT, DPT ? ?12/13/2021, 6:57 PM  ?

## 2021-12-13 NOTE — Progress Notes (Signed)
Occupational Therapy Session Note ? ?Patient Details  ?Name: Patrick Rose ?MRN: [id number] ?Date of Birth: 14-Jan-1951 ? ?Today's Date: 12/13/2021 ?OT Individual Time: [id number] ?OT Individual Time Calculation (min): 10 min  ? ? ?Short Term Goals: ?Week 1:  OT Short Term Goal 1 (Week 1): Pt will maintain static sitting balance with max cueing and mod A ?OT Short Term Goal 2 (Week 1): Pt will don a shirt with mod A ?OT Short Term Goal 3 (Week 1): Pt will complete a sit <> stand with LRAD with max A of 1 ?OT Short Term Goal 4 (Week 1): Pt will demo improved sustained attention, requiring only mod cueing to attend to one grooming tasks ? ?Skilled Therapeutic Interventions/Progress Updates:  ?  1:1. Pt and wife present. Pt sitting upright in bed about to eat. Pt provided with estim placement to L shoulder- deltoid. With good contraction. Pt unable to feel estim. Wife showed how to turn off and on saebo stim and to look for redness PRN, however pt tolerated well yesterday. ? ?60 min unattended stimulation ?Saebo Stim One ?330 pulse width ?35 Hz pulse rate ?On 8 sec/ off 8 sec ?Ramp up/ down 2 sec ?Symmetrical Biphasic wave form  ?Max intensity 147mA at 500 Ohm load ? ? ?Exited session with pt seated in bed, exit alarm on and call light in reach ? ? ?Therapy Documentation ?Precautions:  ?Precautions ?Precautions: Fall ?Precaution Comments: dense L hemipareisis with UE/LE tone, pushes to L, limited focus ?Restrictions ?Weight Bearing Restrictions: No ?General: ?  ? ?Therapy/Group: Individual Therapy ? ?Patrick Rose ?12/13/2021, 7:50 AM ?

## 2021-12-13 NOTE — Progress Notes (Signed)
?                                                       PROGRESS NOTE ? ? ?Subjective/Complaints: ?C/o hip pain: kpad ordered ?Eating healthy breakfast with wife's assistance for set-up ?C/o hiccups that are worse after supper- will add 5mg  baclofen after supper as well. Recommended drinking 6-8 glasses of water ? ?ROS: Patient denies fever, rash, sore throat, blurred vision, dizziness, nausea, vomiting, diarrhea, cough, shortness of breath or chest pain, headache, or mood change. +hiccups, +chronic hip pain ? ? ?Objective: ?  ?No results found. ?No results for input(s): WBC, HGB, HCT, PLT in the last 72 hours. ? ?No results for input(s): NA, K, CL, CO2, GLUCOSE, BUN, CREATININE, CALCIUM in the last 72 hours. ? ? ?Intake/Output Summary (Last 24 hours) at 12/13/2021 0919 ?Last data filed at 12/13/2021 [id number] ?Gross per 24 hour  ?Intake 360 ml  ?Output 1800 ml  ?Net -1440 ml  ?  ? ?  ? ?Physical Exam: ?Vital Signs ?Blood pressure 138/81, pulse (!) 59, temperature 98.4 ?F (36.9 ?C), temperature source Oral, resp. rate 16, height 6\' 1"  (1.854 m), SpO2 96 %. ?Gen: no distress, normal appearing ?HEENT: oral mucosa pink and moist, NCAT ?Cardio: Bradycardia ?Chest: normal effort, normal rate of breathing ?Abd: soft, non-distended ?Ext: no edema ?Psych: pleasant, normal affect ?Skin: maculopapular rash along back. Multiple bruises Right thigh upper exts.  ?Neuro:  Pt is alert to person, place, reason. Follows simple commands. Left central 7 and left hemianopsia. Dysarthric. LUE and LLE 0/5 with significant flexor tone LUE  1/4, and flex/ext tone LLE 2-3/4. Left SCM also tight 2/4.  DTR's 3+ on left ?Musculoskeletal: mild left hip pain, no limb pain with palpation  ? ? ?Assessment/Plan: ?1. Functional deficits which require 3+ hours per day of interdisciplinary therapy in a comprehensive inpatient rehab setting. ?Physiatrist is providing close team supervision and 24 hour management of active medical problems listed  below. ?Physiatrist and rehab team continue to assess barriers to discharge/monitor patient progress toward functional and medical goals ? ?Care Tool: ? ?Bathing ? Bathing activity did not occur: Safety/medical concerns ?Body parts bathed by patient: Chest, Abdomen, Front perineal area, Right upper leg, Left upper leg, Face  ? Body parts bathed by helper: Buttocks, Right arm, Left arm, Right lower leg, Left lower leg ?  ?  ?Bathing assist Assist Level: 2 Helpers ?  ?  ?Upper Body Dressing/Undressing ?Upper body dressing Upper body dressing/undressing activity did not occur (including orthotics): Safety/medical concerns ?What is the patient wearing?: Pull over shirt ?   ?Upper body assist Assist Level: Maximal Assistance - Patient 25 - 49% ?   ?Lower Body Dressing/Undressing ?Lower body dressing ? ? ?   ?What is the patient wearing?: Pants ? ?  ? ?Lower body assist Assist for lower body dressing: Total Assistance - Patient < 25% ?   ? ?Toileting ?Toileting    ?Toileting assist Assist for toileting: Dependent - Patient 0% ?  ?  ?Transfers ?Chair/bed transfer ? ?Transfers assist ? Chair/bed transfer activity did not occur: Safety/medical concerns ? ?Chair/bed transfer assist level: 2 Helpers ?  ?  ?Locomotion ?Ambulation ? ? ?Ambulation assist ? ? Ambulation activity did not occur: Safety/medical concerns ? ?  ?  ?   ? ?Walk 10 feet activity ? ? ?  Assist ? Walk 10 feet activity did not occur: Safety/medical concerns ? ?  ?   ? ?Walk 50 feet activity ? ? ?Assist Walk 50 feet with 2 turns activity did not occur: Safety/medical concerns ? ?  ?   ? ? ?Walk 150 feet activity ? ? ?Assist Walk 150 feet activity did not occur: Safety/medical concerns ? ?  ?  ?  ? ?Walk 10 feet on uneven surface  ?activity ? ? ?Assist Walk 10 feet on uneven surfaces activity did not occur: Safety/medical concerns ? ? ?  ?   ? ?Wheelchair ? ? ? ? ?Assist Is the patient using a wheelchair?: Yes ?Type of Wheelchair: Manual ?Wheelchair activity did  not occur: Safety/medical concerns ? ?  ?   ? ? ?Wheelchair 50 feet with 2 turns activity ? ? ? ?Assist ? ?  ?Wheelchair 50 feet with 2 turns activity did not occur: Safety/medical concerns ? ? ?   ? ?Wheelchair 150 feet activity  ? ? ? ?Assist ? Wheelchair 150 feet activity did not occur: Safety/medical concerns ? ? ?   ? ?Blood pressure 138/81, pulse (!) 59, temperature 98.4 ?F (36.9 ?C), temperature source Oral, resp. rate 16, height 6\' 1"  (1.854 m), SpO2 96 %. ? ? Medical Problem List and Plan: ?1. Functional deficits secondary to right MCA infarct likely embolic d/t  atrial fib ?            -patient may  shower ?            -ELOS/Goals: 14-20 days, min assist with PT, OT, SLP ? -Continue CIR therapies including PT, OT, and SLP   ?2.  Antithrombotics: ?-DVT/anticoagulation:  Pharmaceutical: Other (comment)--Eliquis started on 04/04. Dopplers clean ?            -antiplatelet therapy: N/A ?3. Left hip pain/Pain Management:   ?-xr without fx,?mild HO--> focus on ROM, spasticity control ?--Tylenol prn. Voltaren gel for local measures.  ?-add kpad ?4. Mood: LCSW to follow for evaluation and support.  ?            -antipsychotic agents: N/A ?5. Neuropsych: This patient maybe intermittently capable of making decisions on his own behalf. ?6. Skin/Wound Care: Routine pressure relief measures.  ? -hydrocortisone for rash on back ?7. Fluids/Electrolytes/Nutrition: encourage PO..   ?   ?8. A fib: Monitor HR TID-HR in 90's ?--monitor for symptoms with activity.  ?9. Dysphagia: Continue D2, nectar liquids with supervision for safety ?-encourage fluids, labs ok ?10. Hyperlipidemia: LDL- continue Crestor 20 mg  ?11. Constipation:  continue Senna S  BID ?             4/7 multiple bm's after sorbitol,etc ?12. Acute blood loss anemia: Hbg down from 14.4-->12.2 ?--Monitor for signs of bleeding. ?            --hgb up to 13.8 4/5  ?13. H/o Cervical DDD w/cord compression:  s/p ACDF with residual numbness of bilateral hands ?14.  Lumbar stenosis: S/p decompression Jan 2023.  ?15. Impaired fasting glucose: Hgb A1c-5.4.  ?16. Transaminitis: mild elevation 4/5 ? -likely reactive. F/u Monday ?17. Spastic left hemiparesis ? -resting orthotics ordered, positioning/stretching by therapy ? --continue tizanidine to 2mg  TID ? -likely botox candidate as outpt ? -discussed that baclofen will also help with spasticity ?18. Hiccups: start baclofen 5mg  HS. Add additional 5mg  baclofen after supper when hiccups are worst ? ?  ? ?LOS: ?5 days ?A FACE TO FACE EVALUATION WAS PERFORMED ? ?Martha Clan P  Shoni Quijas ?12/13/2021, 9:19 AM  ? ?  ?

## 2021-12-14 DIAGNOSIS — I63511 Cerebral infarction due to unspecified occlusion or stenosis of right middle cerebral artery: Secondary | ICD-10-CM | POA: Diagnosis not present

## 2021-12-14 LAB — CBC
HCT: 39.5 % (ref 39.0–52.0)
Hemoglobin: 13.4 g/dL (ref 13.0–17.0)
MCH: 29.6 pg (ref 26.0–34.0)
MCHC: 33.9 g/dL (ref 30.0–36.0)
MCV: 87.4 fL (ref 80.0–100.0)
Platelets: 255 10*3/uL (ref 150–400)
RBC: 4.52 MIL/uL (ref 4.22–5.81)
RDW: 13.2 % (ref 11.5–15.5)
WBC: 10.5 10*3/uL (ref 4.0–10.5)
nRBC: 0 % (ref 0.0–0.2)

## 2021-12-14 LAB — BASIC METABOLIC PANEL
Anion gap: 6 (ref 5–15)
BUN: 21 mg/dL (ref 8–23)
CO2: 25 mmol/L (ref 22–32)
Calcium: 9.1 mg/dL (ref 8.9–10.3)
Chloride: 108 mmol/L (ref 98–111)
Creatinine, Ser: 0.97 mg/dL (ref 0.61–1.24)
GFR, Estimated: >60 mL/min (ref >60)
Glucose, Bld: 126 mg/dL — ABNORMAL HIGH (ref 70–99)
Potassium: 4 mmol/L (ref 3.5–5.1)
Sodium: 139 mmol/L (ref 135–145)

## 2021-12-14 LAB — HEPATIC FUNCTION PANEL
ALT: 35 U/L (ref 0–44)
AST: 26 U/L (ref 15–41)
Albumin: 3.4 g/dL — ABNORMAL LOW (ref 3.5–5.0)
Alkaline Phosphatase: 74 U/L (ref 38–126)
Bilirubin, Direct: 0.2 mg/dL (ref 0.0–0.2)
Indirect Bilirubin: 1.1 mg/dL — ABNORMAL HIGH (ref 0.3–0.9)
Total Bilirubin: 1.3 mg/dL — ABNORMAL HIGH (ref 0.3–1.2)
Total Protein: 5.9 g/dL — ABNORMAL LOW (ref 6.5–8.1)

## 2021-12-14 NOTE — Progress Notes (Signed)
Physical Therapy Session Note ? ?Patient Details  ?Name: Patrick Rose ?MRN: [id number] ?Date of Birth: 1951-08-09 ? ?Today's Date: 12/14/2021 ?PT Individual Time: [id number] ?PT Individual Time Calculation (min): 90 min  ? ?Short Term Goals: ?Week 1:  PT Short Term Goal 1 (Week 1): Pt will perform bed mobility with consistent MaxA and imrproved ability to follow instructions. ?PT Short Term Goal 2 (Week 1): Pt will perform sit<>stand with consistent Mod/ Max +2. ?PT Short Term Goal 3 (Week 1): Pt will perform seat to seat transfers with consistent MaxA +2 ?PT Short Term Goal 4 (Week 1): Pt will demonstrate improved balance with decreased pushing to L side to <50%. ? ?Skilled Therapeutic Interventions/Progress Updates:  ?   ?Patient in bed with his wife at beside upon PT arrival. Patient alert and agreeable to PT session. Patient denied pain at beginning of session, reported 6-8/10 L hip pain during session with increased flexor tone with spasticity, RN made aware. PT provided repositioning, rest breaks, and distraction as pain interventions throughout session.  ? ?[name] brief saturated in urine at beginning of session. Noted scrotal skin breakdown x2 due to moisture, RN made aware and alerting medical team. Patient thoroughly cleaned and dried with barrier cream applied to scrotum and peri-rectal area during session. Educated patient and his wife on importance of assessing for urinary and bowl incontinence due to poor awareness of elimination of bowls or bladder by patient at this time. ? ?Focused remainder of session on neuromuscular re-education for improved motor control of L hemi-body with functional activities with sitting balance, attempts at standing balance, and bed level motor control activities. Noted significant challenges with focused and sustained attention with max cues throughout session.  ? ?Therapeutic Activity: ?Bed Mobility: Patient performed rolling R/L x3 with mod-max A +2 with mod-total A for  L hemi-body management during rolling. Performed peri-care, donned incontinence brief and TED hose with total A bed level during rolling. Provided a pillow between patient's knees for management of L hip pain in side-lying. Patient performed supine to/from sit with max A +2. Provided verbal cues for progressing through side-lying with use of bottom elbow for trunk control. ? ?Neuromuscular Re-ed: ?-sitting EOB focused on midline orientation with visual target on R and hand placement on therapist's knee to reduce pushing 3x3-6 min with max cues for attention to task ?-attempted sit to stand x2 in the [place] with Max A +2, patient with poor motor planning and increased R push and L flexor synergy, able to come to 75% stand x1 with facilitation of hip extension, unable to sustain due to patient letting go of the Stedy causing forward LOB (recommend use of Sara Plus for standing for improved postural control and trunk support) ?-supine prolonged L gastroc stretch with knee extended out of synergy for tone management 3x2 min ?-supine slow graded movement through hip and knee flexion/extension for reduced flexor tone with spasticity ?-supine bridging 2x5 with facilitation through L knee and foot for L hip extension out of synergy ?-L elbow flexion/extension with PNF tapping with trace activation in both agonists, notable flexor tone after ? ?Applied Kinesiotape over L triceps muscle using muscle facilitation technique (25% pull applied proximal>distal) with the aim of promoting triceps activation and over L biceps muscle using muscle inhibition technique (no pull applied distal>proximal). Prior to application, patient denied any history of skin irritation or allergy to adhesive. Educated patient on purpose of kinesiotape placement and signs symptoms of allergic reaction or irritation. Cleaned patient's skin, shaved  arm hair using patient's personal razor, and applied test strip at beginning of session and removed at end of  session without sings of skin irritation. Instructed patient that the tape can be left on up to 3 days and can be worn in the shower. Instructed to removed the tape if peeling off, signs of skin irritation arise, or it has been on for >3 days. Informed patient that the tape is best removed in the shower or with a wet wash cloth. Patient stated understanding. Rehab team informed about tape placement to assist with monitoring patient's response. ? ?Patient in bed with his wife at bedside at end of session with breaks locked, bed alarm set, and all needs within reach.  ? ?Therapy Documentation ?Precautions:  ?Precautions ?Precautions: Fall ?Precaution Comments: dense L hemipareisis with UE/LE tone, pushes to L, limited focus ?Restrictions ?Weight Bearing Restrictions: No ? ? ?Therapy/Group: Individual Therapy ? ?Doreene Burke PT, DPT ? ?12/14/2021, 6:58 PM  ?

## 2021-12-14 NOTE — Progress Notes (Signed)
?                                                       PROGRESS NOTE ? ? ?Subjective/Complaints: ? ?Pt c/o L hip pain- also LBM yesterday.  ?Has a lot of R hip bruising, but L hip is what hurt.  ? ?Waved but sleeping otherwise.  ? ?ROS: limited by sedation ?Objective: ?  ?No results found. ?Recent Labs  ?  12/14/21 ?[id number]  ?WBC 10.5  ?HGB 13.4  ?HCT 39.5  ?PLT 255  ? ? ?Recent Labs  ?  12/14/21 ?[id number]  ?NA 139  ?K 4.0  ?CL 108  ?CO2 25  ?GLUCOSE 126*  ?BUN 21  ?CREATININE 0.97  ?CALCIUM 9.1  ? ? ? ?Intake/Output Summary (Last 24 hours) at 12/14/2021 1716 ?Last data filed at 12/14/2021 1300 ?Gross per 24 hour  ?Intake 600 ml  ?Output --  ?Net 600 ml  ?  ? ?  ? ?Physical Exam: ?Vital Signs ?Blood pressure 138/78, pulse 72, temperature 98 ?F (36.7 ?C), temperature source Oral, resp. rate 18, height 6\' 1"  (1.854 m), SpO2 93 %. ? ? ?General: awake, woke briefly; but went back to sleep; nodding head yes and no;  NAD ?HENT: conjugate gaze; oropharynx moist ?CV: regular rate; no JVD ?Pulmonary: CTA B/L; no W/R/R- good air movement ?GI: soft, NT, ND, (+)BS ?Psychiatric: appropriate- sleepy ?Neurological: asleep- woke briefly ?Skin: maculopapular rash along back. Multiple bruises Right thigh upper exts.  ?Neuro:  Pt is alert to person, place, reason. Follows simple commands. Left central 7 and left hemianopsia. Dysarthric. LUE and LLE 0/5 with significant flexor tone LUE  1/4, and flex/ext tone LLE 2-3/4. Left SCM also tight 2/4.  DTR's 3+ on left ?Musculoskeletal: mild left hip pain, no limb pain with palpation  ? ? ?Assessment/Plan: ?1. Functional deficits which require 3+ hours per day of interdisciplinary therapy in a comprehensive inpatient rehab setting. ?Physiatrist is providing close team supervision and 24 hour management of active medical problems listed below. ?Physiatrist and rehab team continue to assess barriers to discharge/monitor patient progress toward functional and medical goals ? ?Care Tool: ? ?Bathing ?  Bathing activity did not occur: Safety/medical concerns ?Body parts bathed by patient: Chest, Abdomen, Front perineal area, Right upper leg, Left upper leg, Face  ? Body parts bathed by helper: Buttocks, Right arm, Left arm, Right lower leg, Left lower leg ?  ?  ?Bathing assist Assist Level: 2 Helpers ?  ?  ?Upper Body Dressing/Undressing ?Upper body dressing Upper body dressing/undressing activity did not occur (including orthotics): Safety/medical concerns ?What is the patient wearing?: Pull over shirt ?   ?Upper body assist Assist Level: Maximal Assistance - Patient 25 - 49% ?   ?Lower Body Dressing/Undressing ?Lower body dressing ? ? ?   ?What is the patient wearing?: Pants ? ?  ? ?Lower body assist Assist for lower body dressing: Total Assistance - Patient < 25% ?   ? ?Toileting ?Toileting    ?Toileting assist Assist for toileting: Dependent - Patient 0% ?  ?  ?Transfers ?Chair/bed transfer ? ?Transfers assist ? Chair/bed transfer activity did not occur: Safety/medical concerns ? ?Chair/bed transfer assist level: 2 Helpers ?  ?  ?Locomotion ?Ambulation ? ? ?Ambulation assist ? ? Ambulation activity did not occur: Safety/medical concerns ? ?  ?  ?   ? ?  Walk 10 feet activity ? ? ?Assist ? Walk 10 feet activity did not occur: Safety/medical concerns ? ?  ?   ? ?Walk 50 feet activity ? ? ?Assist Walk 50 feet with 2 turns activity did not occur: Safety/medical concerns ? ?  ?   ? ? ?Walk 150 feet activity ? ? ?Assist Walk 150 feet activity did not occur: Safety/medical concerns ? ?  ?  ?  ? ?Walk 10 feet on uneven surface  ?activity ? ? ?Assist Walk 10 feet on uneven surfaces activity did not occur: Safety/medical concerns ? ? ?  ?   ? ?Wheelchair ? ? ? ? ?Assist Is the patient using a wheelchair?: Yes ?Type of Wheelchair: Manual ?Wheelchair activity did not occur: Safety/medical concerns ? ?  ?   ? ? ?Wheelchair 50 feet with 2 turns activity ? ? ? ?Assist ? ?  ?Wheelchair 50 feet with 2 turns activity did not  occur: Safety/medical concerns ? ? ?   ? ?Wheelchair 150 feet activity  ? ? ? ?Assist ? Wheelchair 150 feet activity did not occur: Safety/medical concerns ? ? ?   ? ?Blood pressure 138/78, pulse 72, temperature 98 ?F (36.7 ?C), temperature source Oral, resp. rate 18, height 6\' 1"  (1.854 m), SpO2 93 %. ? ? Medical Problem List and Plan: ?1. Functional deficits secondary to right MCA infarct likely embolic d/t  atrial fib ?            -patient may  shower ?            -ELOS/Goals: 14-20 days, min assist with PT, OT, SLP ? -Continue CIR- PT, OT and SLP  ?2.  Antithrombotics: ?-DVT/anticoagulation:  Pharmaceutical: Other (comment)--Eliquis started on 04/04. Dopplers clean ?            -antiplatelet therapy: N/A ?3. Left hip pain/Pain Management:   ?-xr without fx,?mild HO--> focus on ROM, spasticity control ?--Tylenol prn. Voltaren gel for local measures.  ?-add kpad ?4. Mood: LCSW to follow for evaluation and support.  ?            -antipsychotic agents: N/A ?5. Neuropsych: This patient maybe intermittently capable of making decisions on his own behalf. ?6. Skin/Wound Care: Routine pressure relief measures.  ? -hydrocortisone for rash on back ?7. Fluids/Electrolytes/Nutrition: encourage PO..   ?   ?8. A fib: Monitor HR TID-HR in 90's ?--monitor for symptoms with activity.  ?4/10- HR in 70s- con't regimen ?9. Dysphagia: Continue D2, nectar liquids with supervision for safety ?-encourage fluids, labs ok ?10. Hyperlipidemia: LDL- continue Crestor 20 mg  ?11. Constipation:  continue Senna S  BID ?             4/7 multiple bm's after sorbitol,etc ?12. Acute blood loss anemia: Hbg down from 14.4-->12.2 ?--Monitor for signs of bleeding. ?            --hgb up to 13.8 4/5  ? 4/10- Hb 13.4-  ?13. H/o Cervical DDD w/cord compression:  s/p ACDF with residual numbness of bilateral hands ?14. Lumbar stenosis: S/p decompression Jan 2023.  ?15. Impaired fasting glucose: Hgb A1c-5.4.  ?16. Transaminitis: mild elevation 4/5 ? -likely  reactive. F/u Monday ? 4/10- LFTs back to baseline-  ?17. Spastic left hemiparesis ? -resting orthotics ordered, positioning/stretching by therapy ? --continue tizanidine to 2mg  TID ? -likely botox candidate as outpt ? -discussed that baclofen will also help with spasticity ?18. Hiccups: start baclofen 5mg  HS. Add additional 5mg  baclofen after supper when  hiccups are worst ? 4/10- sleepy- but pt denies from baclofen ? ? ? ?  ? ?LOS: ?6 days ?A FACE TO FACE EVALUATION WAS PERFORMED ? ?Patrick Rose ?12/14/2021, 5:16 PM  ? ?  ?

## 2021-12-14 NOTE — Progress Notes (Signed)
Occupational Therapy Session Note ? ?Patient Details  ?Name: Patrick Rose ?MRN: [id number] ?Date of Birth: 01/31/1951 ? ?Today's Date: 12/14/2021 ?OT Individual Time: [id number] ?OT Individual Time Calculation (min): 55 min  ? ? ?Short Term Goals: ?Week 1:  OT Short Term Goal 1 (Week 1): Pt will maintain static sitting balance with max cueing and mod A ?OT Short Term Goal 2 (Week 1): Pt will don a shirt with mod A ?OT Short Term Goal 3 (Week 1): Pt will complete a sit <> stand with LRAD with max A of 1 ?OT Short Term Goal 4 (Week 1): Pt will demo improved sustained attention, requiring only mod cueing to attend to one grooming tasks ? ?Skilled Therapeutic Interventions/Progress Updates:  ?  Pt received supine with no c/o pain, agreeable to OT session. Pt unaware of incontinent BM. Max cueing required for sustained attention to task with bed mobility. Min A to roll thoroughly to his L side. While OT was providing total A peri hygiene pt continued to have a BM so placed him on bedpan. Pt with frequent c/o left hip pain with spasms. Alerted RN to pt having skin breakdown and sensitivity peri-rectally and a small spot on his scrotum. Bed mobility to the R with severe pain in the L hip, requiring max A +2 to maintain side lying. Alerted NT to need to continue peri care d/t ongoing BM. Pt required max cueing for sustaining attention to task for more than 10 seconds.  ? ?Saebo Stim One was placed on his L tricep for muscle activation and reduction of flexor tone. Pt tolerated well with no pain or changes in skin integrity. 60 min unattended. Pt really enjoys using the saebo!  ?330 pulse width ?35 Hz pulse rate ?On 8 sec/ off 8 sec ?Ramp up/ down 2 sec ?Symmetrical Biphasic wave form  ?Max intensity 167mA at 500 Ohm load ? ? ?Therapy Documentation ?Precautions:  ?Precautions ?Precautions: Fall ?Precaution Comments: dense L hemipareisis with UE/LE tone, pushes to L, limited focus ?Restrictions ?Weight Bearing Restrictions:  No ? ?Therapy/Group: Individual Therapy ? ?Curtis Sites ?12/14/2021, 6:32 AM ?

## 2021-12-14 NOTE — Progress Notes (Signed)
Speech Language Pathology Weekly Progress and Session Note ? ?Patient Details  ?Name: Patrick Rose ?MRN: [id number] ?Date of Birth: [birth year] ? ?Beginning of progress report period: December 08, 2021 ?End of progress report period: December 14, 2020 ? ?Today's Date: 12/14/2021 ?SLP Individual Time: 0810-0900 ?SLP Individual Time Calculation (min): 50 min ? ?Short Term Goals: ?Week 1: SLP Short Term Goal 1 (Week 1): Pt will consume current diet Dys 2 textures and Nectar thick liquids with mod A verbal cues to correct left spillage and pocketing. ?SLP Short Term Goal 1 - Progress (Week 1): Not met ?SLP Short Term Goal 2 (Week 1): Pt will consume thin liquid trials via cup with minimal s/s aspiration piror instrumental swallow assessment. ?SLP Short Term Goal 2 - Progress (Week 1): Not met ?SLP Short Term Goal 3 (Week 1): Pt will complete mildly complex problem solving tasks with min A verbal cues. ?SLP Short Term Goal 3 - Progress (Week 1): Not met ?SLP Short Term Goal 4 (Week 1): Pt will demonstrate verbal and functional error awareness with mod A verbal cues. ?SLP Short Term Goal 4 - Progress (Week 1): Not met ?SLP Short Term Goal 5 (Week 1): Pt will demonstrate sustained attention in functional tasks in 10-15 minute intervals with mod A verbal cues. ?SLP Short Term Goal 5 - Progress (Week 1): Not met ?SLP Short Term Goal 6 (Week 1): Pt will demonstrate use of speech intelligibility strategies at the sentence level producing 80% intelligibility with min A verbal cues. ?SLP Short Term Goal 6 - Progress (Week 1): Met ? ?  ?New Short Term Goals: ?Week 2: SLP Short Term Goal 1 (Week 2): Pt will consume current diet Dys 2 textures and Nectar thick liquids with mod A verbal cues to correct left spillage and pocketing. ?SLP Short Term Goal 2 (Week 2): Pt will consume thin liquid trials via cup with overt s/s of aspiration in less than 25% of trials with Mod verbal cues over 2 sessions prior to MBS. ?SLP Short Term Goal 3 (Week  2): Pt will demonstrate use of speech intelligibility strategies at the sentence level producing 80% intelligibility with min A verbal cues. ?SLP Short Term Goal 4 (Week 2): Patient will demonstrate sustained attention to functional tasks for 3 minutes with Max A verbal cues. ?SLP Short Term Goal 5 (Week 2): Patient will scan/attend to left field of enviornment during functional tasks with Mod verbal cues in 50% of opportunities. ?SLP Short Term Goal 6 (Week 2): Patient will demonstrate functional problem solving for basic and familiar tasks with Mod verbal cues. ? ?Weekly Progress Updates: Patient has made minimal gains and has met 1 of 6 STGs this reporting period. Currently, patient is consuming Dys. 2 textures with nectar-thick liquids with minimal overt s/s of aspiration but requires Max A multimodal cues for use of swallowing compensatory strategies.  Patient also demonstrates severe cognitive impairments impacting sustained attention, functional problem solving, emergent awareness, attention/scanning to left field of environment, and safety awareness. Patient and family education ongoing. Patient would benefit from continued skilled SLP intervention to maximize his swallowing and cognitive functioning prior to discharge.  ?  ?Intensity: Minumum of 1-2 x/day, 30 to 90 minutes ?Frequency: 3 to 5 out of 7 days ?Duration/Length of Stay: 3 weeks ?Treatment/Interventions: Cognitive remediation/compensation;Cueing hierarchy;Dysphagia/aspiration precaution training;Functional tasks;Medication managment;Patient/family education;Internal/external aids;Speech/Language facilitation ? ? ?Daily Session ? ?Skilled Therapeutic Interventions:  Skilled treatment session focused on dysphagia and cognitive goals. SLP facilitated session by providing Max A verbal cues  for focused attention to task for 30-60 seconds due to lethargy and verbosity. Patient with severe left buccal pocketing and moderate left anterior spillage  requiring Max verbal cues to self-monitor and correct. [name] wife present and provided appropriate cueing for attention. Recommend patient continue current diet. Oral care was provided after the meal resulting in moderate oral residue removed. [name] wife educated on performing oral care after all meals. She verbalized understanding and agreement. Patient left upright in bed with alarm on and all needs within reach. Continue with current plan of care.  ? ? ?Pain ?No/Denies Pain  ? ?Therapy/Group: Individual Therapy ? ?Ajmal Kathan ?12/14/2021, 12:33 PM ? ? ? ? ? ? ?

## 2021-12-15 ENCOUNTER — Inpatient Hospital Stay (HOSPITAL_COMMUNITY): Payer: Medicare (Managed Care)

## 2021-12-15 DIAGNOSIS — I63511 Cerebral infarction due to unspecified occlusion or stenosis of right middle cerebral artery: Secondary | ICD-10-CM | POA: Diagnosis not present

## 2021-12-15 MED ORDER — DANTROLENE SODIUM 25 MG PO CAPS
50.0000 mg | ORAL_CAPSULE | Freq: Every day | ORAL | Status: DC
  Administered 2021-12-15 – 2021-12-20 (×6): 50 mg via ORAL
  Filled 2021-12-15 (×6): qty 2

## 2021-12-15 MED ORDER — TRAMADOL HCL 50 MG PO TABS
50.0000 mg | ORAL_TABLET | Freq: Four times a day (QID) | ORAL | Status: DC
  Administered 2021-12-15 – 2022-01-04 (×59): 50 mg via ORAL
  Filled 2021-12-15 (×68): qty 1

## 2021-12-15 MED ORDER — BACLOFEN 5 MG HALF TABLET
5.0000 mg | ORAL_TABLET | Freq: Four times a day (QID) | ORAL | Status: DC
  Administered 2021-12-15 – 2021-12-18 (×13): 5 mg via ORAL
  Filled 2021-12-15 (×13): qty 1

## 2021-12-15 NOTE — Progress Notes (Incomplete)
Occupational Therapy Session Note ? ?Patient Details  ?Name: Patrick Rose ?MRN: [id number] ?Date of Birth: May 02, 1951 ? ?{CHL IP REHAB OT TIME CALCULATIONS:[id number]} ? ? ?Short Term Goals: ?Week 1:  OT Short Term Goal 1 (Week 1): Pt will maintain static sitting balance with max cueing and mod A ?OT Short Term Goal 2 (Week 1): Pt will don a shirt with mod A ?OT Short Term Goal 3 (Week 1): Pt will complete a sit <> stand with LRAD with max A of 1 ?OT Short Term Goal 4 (Week 1): Pt will demo improved sustained attention, requiring only mod cueing to attend to one grooming tasks ? ?Skilled Therapeutic Interventions/Progress Updates:  ?   ? ?Therapy Documentation ?Precautions:  ?Precautions ?Precautions: Fall ?Precaution Comments: dense L hemipareisis with UE/LE tone, pushes to L, limited focus ?Restrictions ?Weight Bearing Restrictions: No ? ? ? ?Therapy/Group: Co-Treatment ? ?Caryl Asp Shonette Rhames ?12/15/2021, 7:55 AM ?

## 2021-12-15 NOTE — Progress Notes (Signed)
Physical Therapy Session Note ? ?Patient Details  ?Name: Patrick Rose ?MRN: [id number] ?Date of Birth: December 23, 1950 ? ?Today's Date: 12/15/2021 ?PT Individual Time: 0800-0900 ?PT Individual Time Calculation (min): 60 min  ? ?Short Term Goals: ?Week 1:  PT Short Term Goal 1 (Week 1): Pt will perform bed mobility with consistent MaxA and imrproved ability to follow instructions. ?PT Short Term Goal 2 (Week 1): Pt will perform sit<>stand with consistent Mod/ Max +2. ?PT Short Term Goal 3 (Week 1): Pt will perform seat to seat transfers with consistent MaxA +2 ?PT Short Term Goal 4 (Week 1): Pt will demonstrate improved balance with decreased pushing to L side to <50%. ? ?Skilled Therapeutic Interventions/Progress Updates:  ?   ?Patient in bed upon PT arrival. Patient alert and agreeable to PT session. Patient reported 6-8/10 L hip pain during session with increased flexor tone with spasticity, RN made aware. Reports L hip and knee in flexor tone throughout the night. PT provided repositioning, rest breaks, and distraction as pain interventions throughout session.  ? ?Patient with condom catheter hooked up to suction this morning. Nursing called to remove patient from this device to allow mobility. Peri-care performed with total A, during which patient was incontinent of bowl. Attempted to locate fracture bed pan for improved patient tolerance without succuss, asked nurse secretary to have one ordered for the patient due to poor tolerance with spasticity of other bed pan options.  ? ?Therapeutic Activity: ?Bed Mobility: Patient performed rolling R/L x3 with mod-max A +2 with mod-total A for L hemi-body management during rolling. Performed peri-care, donned incontinence brief, shorts, socks, and tennis shoes during rolling. Provided a pillow between patient's knees for management of L hip pain in side-lying. Patient performed supine to/from sit with mod A +2 to the L. Provided verbal cues for progressing through side-lying  with use of bottom elbow for trunk control. ?Transfers: Patient performed a lateral scoot transfer bed>TIS w/c with total A +3, 2 physical assist and 1 for stabilizing the w/c. Provided max multimodal cues for hand placement and head-hips relationship and attention to task. Patient with poor initiation and strong pushing with the R hand and leg due to inability to sustain attention to cues >1 sec.  ? ?Neuromuscular Re-ed: ?Patient performed the following L lower extremity motor control activities for tone management: ?-supine prolonged L gastroc stretch with knee extended out of synergy for tone management 3x2 min ?-supine slow graded movement through hip and knee flexion/extension for reduced flexor tone with spasticity ?-supine bridging 2x5 with facilitation through L knee and foot for L hip extension out of synergy ?-L elbow flexion/extension with PNF tapping with trace activation in both agonists, notable flexor tone after ? ?Patient in TIS w/c set-up for breakfast with his wife in the room, hand-off provided to SLP outside patient's room at end of session with breaks locked and all needs within reach.  ? ?Therapy Documentation ?Precautions:  ?Precautions ?Precautions: Fall ?Precaution Comments: dense L hemipareisis with UE/LE tone, pushes to L, limited focus ?Restrictions ?Weight Bearing Restrictions: No ? ? ? ?Therapy/Group: Individual Therapy ? ?Helayne Seminole PT, DPT ? ?12/15/2021, 11:24 AM  ?

## 2021-12-15 NOTE — Patient Care Conference (Signed)
Inpatient RehabilitationTeam Conference and Plan of Care Update ?Date: 12/15/2021   Time: 10:35 AM  ? ? ?Patient Name: Patrick Rose      ?Medical Record Number: [id number]  ?Date of Birth: 05-14-1951 ?Sex: Male         ?Room/Bed: 5C02C/5C02C-01 ?Payor Info: Payor: [employer] MEDICARE / Plan: [employer] MEDICARE HMO/PPO / Product Type: *No Product type* /   ? ?Admit Date/Time:  12/08/2021  5:04 PM ? ?Primary Diagnosis:  Acute ischemic right MCA stroke (HCC) ? ?Hospital Problems: Principal Problem: ?  Acute ischemic right MCA stroke (HCC) ? ? ? ?Expected Discharge Date: Expected Discharge Date:  (4 Weeks) ? ?Team Members Present: ?Physician leading conference: Dr. Genice Rouge ?Social Worker Present: Cecile Sheerer, LCSWA ?Nurse Present: Kennyth Arnold, RN ?PT Present: Serina Cowper, PT ?OT Present: Roney Mans, OT ?SLP Present: Other (comment) ?PPS Coordinator present : Cheri Rous, OT ? ?   Current Status/Progress Goal Weekly Team Focus  ?Bowel/Bladder ? ? incontinent b/b, llbm 4/10  regain continence  toilet q 2hr and prn   ?Swallow/Nutrition/ Hydration ? ? Dys. 2 textures with nectar-thick liquids, Mod-Max A  Mod I  use of swallowing strategies, trials of thin liquids   ?ADL's ? ? max- total A overall. Pt a severe pusher, profound attention deficits, limited by frequent BM's last several days  Min A- lofty goals  L NMR, ADLs, sitting balance, sustained attention, transfers   ?Mobility ? ? max-total A +2 overall, strong flexor tone with spasticity of L lower extremity causing L hip pain, strong controversive pushing, very limited by attention deficits and bowl and bladder incontinence  min A, mod A gait 75 ft, 8 steps  Midline orientation, activity tolerance, attention, functional mobility, L hemi-body NMR, standing for weight bearing, initiation of gait training, sitting tolreance, patient/caregiver education   ?Communication ? ? supervision  Supervision  use of swallowing compensatory strategies    ?Safety/Cognition/ Behavioral Observations ? Max A  Min A  sustained attention, left attention, emergent awareness   ?Pain ? ? pain left and right thigh, low back, itching  < 3  assess pain q 4hr and prn   ?Skin ? ? bruising, rash to back  no new breakdown  assess skin q shift and prn   ? ? ?Discharge Planning:  ?D/c to home with his wife who will provide 24/7 care.   ?Team Discussion: ?Added Dantrolene for spasticity. Doesn't cause drowsiness. K-pad ordered. Spine x-ray ordered, reports no x-ray taken after fall at home. Incontinent B/B, pain thigh, lower back. Scheduled Tramadol. Reported itching over the weekend, could be medication related, will verify. Ordered hypo-allergenic linens. Wife brought in peri-wipes to assist with hygiene. Discharging home with spouse. ? ?Patient on target to meet rehab goals: ?yes, min assist goals. Currently total assist. Does not perceive midline. Left inattention. Severe tone. Dys 2, nectar thick liquids. Mod/max assist. ? ?*See Care Plan and progress notes for long and short-term goals.  ? ?Revisions to Treatment Plan:  ?Adjusting medications, x-ray ordered. ?  ?Teaching Needs: ?Family education, medication/pain management, skin/wound care, transfer/gait training, safety awareness, etc. ?  ?Current Barriers to Discharge: ?Decreased caregiver support, Home enviroment access/layout, Incontinence, Wound care, Lack of/limited family support, and Weight ? ?Possible Resolutions to Barriers: ?Family education ?Follow up PT/OT/SLP ?Order recommended DME ?  ? ? Medical Summary ?Current Status: severe spasticity of L side- had hives with Xarelto?; on Eliquis- will add to allergy list- incontinent B/B-frequently; cortisone lotion ofr itching; pain in back neck and  LLE- ? Barriers to Discharge: Behavior;Decreased family/caregiver support;Home enviroment access/layout;Incontinence;Neurogenic Bowel & Bladder;Medical stability;Other (comments);Wound care;Nutrition means;Weight bearing  restrictions ? Barriers to Discharge Comments: D2 nectar thick liquids-goals min A for SLP cog ?Possible Resolutions to [employer]: back pain - will xray back- spasticity- add baclofen 5 mg QID (was on BID) and Dantrolene 50 mg QHS- is total-max A; will give condom cath since voiding/incontinent all the time- pusher- cannot perceive midline- L neglect- d/c senna- concerns that will need min-mod A for goals- other limitation doesn't recognize L LE has some movement- automatic- d/c date- 4 weeks ? ? ?Continued Need for Acute Rehabilitation Level of Care: The patient requires daily medical management by a physician with specialized training in physical medicine and rehabilitation for the following reasons: ?Direction of a multidisciplinary physical rehabilitation program to maximize functional independence : Yes ?Medical management of patient stability for increased activity during participation in an intensive rehabilitation regime.: Yes ?Analysis of laboratory values and/or radiology reports with any subsequent need for medication adjustment and/or medical intervention. : Yes ? ? ?I attest that I was present, lead the team conference, and concur with the assessment and plan of the team. ? ? ?Kennyth Arnold G ?12/15/2021, 2:34 PM  ? ? ? ? ? ? ?

## 2021-12-15 NOTE — Progress Notes (Signed)
?                                                       PROGRESS NOTE ? ? ?Subjective/Complaints: ? ?Pt c/o L hip pain due to spasticity- doesn't even want nursing to move him in bed due to pain. Hiccups alittle better- also wants a kpad /heat for R shoulder and back and L>R hip pain.  ?Keeps L hip flexed all the time per nursing due to pain/spasticity- won't let me examine it.   ?C/o back pain ever since fall- and worried back got hurt from back surgery in January 2023.  ? ? ?ROS:  ?Pt denies SOB, abd pain, CP, N/V/C/ (+)D, and vision changes ? ?Objective: ?  ?No results found. ?Recent Labs  ?  12/14/21 ?[id number]  ?WBC 10.5  ?HGB 13.4  ?HCT 39.5  ?PLT 255  ? ? ?Recent Labs  ?  12/14/21 ?[id number]  ?NA 139  ?K 4.0  ?CL 108  ?CO2 25  ?GLUCOSE 126*  ?BUN 21  ?CREATININE 0.97  ?CALCIUM 9.1  ? ? ? ?Intake/Output Summary (Last 24 hours) at 12/15/2021 0855 ?Last data filed at 12/15/2021 [id number] ?Gross per 24 hour  ?Intake 360 ml  ?Output 1350 ml  ?Net -990 ml  ?  ? ?  ? ?Physical Exam: ?Vital Signs ?Blood pressure 120/85, pulse 99, temperature 98 ?F (36.7 ?C), resp. rate 18, height 6\' 1"  (1.854 m), SpO2 91 %. ? ? ? ?General: awake, alert, appropriate, incontinent of pasty stool; nursing is changing; NAD ?HENT: conjugate gaze; oropharynx moist ?CV: regular rate; no JVD ?Pulmonary: CTA B/L; no W/R/R- good air movement ?GI: soft, NT, ND, (+)BS ?Psychiatric: appropriate- impulsive; anxious ?Neurological: alert- severe spasticity of L hip/knee- won't let me examine due to pain, but bringing L hip almost to chest  ? ?Skin: maculopapular rash along back. Multiple bruises Right thigh upper exts. R hip/groin purple from bruising  ?Neuro:  Pt is alert to person, place, reason. Follows simple commands. Left central 7 and left hemianopsia. Dysarthric. LUE and LLE 0/5 with significant flexor tone LUE  1/4, and flex/ext tone LLE 2-3/4. Left SCM also tight 2/4.  DTR's 3+ on left ?Musculoskeletal: mild left hip pain, no limb pain with palpation   ? ? ?Assessment/Plan: ?1. Functional deficits which require 3+ hours per day of interdisciplinary therapy in a comprehensive inpatient rehab setting. ?Physiatrist is providing close team supervision and 24 hour management of active medical problems listed below. ?Physiatrist and rehab team continue to assess barriers to discharge/monitor patient progress toward functional and medical goals ? ?Care Tool: ? ?Bathing ? Bathing activity did not occur: Safety/medical concerns ?Body parts bathed by patient: Chest, Abdomen, Front perineal area, Right upper leg, Left upper leg, Face  ? Body parts bathed by helper: Buttocks, Right arm, Left arm, Right lower leg, Left lower leg ?  ?  ?Bathing assist Assist Level: 2 Helpers ?  ?  ?Upper Body Dressing/Undressing ?Upper body dressing Upper body dressing/undressing activity did not occur (including orthotics): Safety/medical concerns ?What is the patient wearing?: Pull over shirt ?   ?Upper body assist Assist Level: Maximal Assistance - Patient 25 - 49% ?   ?Lower Body Dressing/Undressing ?Lower body dressing ? ? ?   ?What is the patient wearing?: Pants ? ?  ? ?Lower body assist Assist  for lower body dressing: Total Assistance - Patient < 25% ?   ? ?Toileting ?Toileting    ?Toileting assist Assist for toileting: Dependent - Patient 0% ?  ?  ?Transfers ?Chair/bed transfer ? ?Transfers assist ? Chair/bed transfer activity did not occur: Safety/medical concerns ? ?Chair/bed transfer assist level: 2 Helpers ?  ?  ?Locomotion ?Ambulation ? ? ?Ambulation assist ? ? Ambulation activity did not occur: Safety/medical concerns ? ?  ?  ?   ? ?Walk 10 feet activity ? ? ?Assist ? Walk 10 feet activity did not occur: Safety/medical concerns ? ?  ?   ? ?Walk 50 feet activity ? ? ?Assist Walk 50 feet with 2 turns activity did not occur: Safety/medical concerns ? ?  ?   ? ? ?Walk 150 feet activity ? ? ?Assist Walk 150 feet activity did not occur: Safety/medical concerns ? ?  ?  ?  ? ?Walk 10  feet on uneven surface  ?activity ? ? ?Assist Walk 10 feet on uneven surfaces activity did not occur: Safety/medical concerns ? ? ?  ?   ? ?Wheelchair ? ? ? ? ?Assist Is the patient using a wheelchair?: Yes ?Type of Wheelchair: Manual ?Wheelchair activity did not occur: Safety/medical concerns ? ?  ?   ? ? ?Wheelchair 50 feet with 2 turns activity ? ? ? ?Assist ? ?  ?Wheelchair 50 feet with 2 turns activity did not occur: Safety/medical concerns ? ? ?   ? ?Wheelchair 150 feet activity  ? ? ? ?Assist ? Wheelchair 150 feet activity did not occur: Safety/medical concerns ? ? ?   ? ?Blood pressure 120/85, pulse 99, temperature 98 ?F (36.7 ?C), resp. rate 18, height 6\' 1"  (1.854 m), SpO2 91 %. ? ? Medical Problem List and Plan: ?1. Functional deficits secondary to right MCA infarct likely embolic d/t  atrial fib ?            -patient may  shower ?            -ELOS/Goals: 14-20 days, min assist with PT, OT, SLP ? Continue CIR- PT, OT and SLP ?Team conference today to determine length of stay ?2.  Antithrombotics: ?-DVT/anticoagulation:  Pharmaceutical: Other (comment)--Eliquis started on 04/04. Dopplers clean ?            -antiplatelet therapy: N/A ?3. Left hip pain/Pain Management:   ?-xr without fx,?mild HO--> focus on ROM, spasticity control ?--Tylenol prn. Voltaren gel for local measures.  ?-add kpad ?4/11- kapd ordered again; add tramadol 50 mg q6 hours for pain; spasticity as below ?4. Mood: LCSW to follow for evaluation and support.  ?            -antipsychotic agents: N/A ?5. Neuropsych: This patient maybe intermittently capable of making decisions on his own behalf. ?6. Skin/Wound Care: Routine pressure relief measures.  ? -hydrocortisone for rash on back ?7. Fluids/Electrolytes/Nutrition: encourage PO..   ?   ?8. A fib: Monitor HR TID-HR in 90's ?--monitor for symptoms with activity.  ?4/11- HR fluctuating under 100-  ?9. Dysphagia: Continue D2, nectar liquids with supervision for safety ?-encourage fluids, labs  ok ?10. Hyperlipidemia: LDL- continue Crestor 20 mg  ?11. Constipation:  continue Senna S  BID ?             4/7 multiple bm's after sorbitol,etc ? 4/11- stop Senna since pt's stools are pasty/frequent/incontinent ?12. Acute blood loss anemia: Hbg down from 14.4-->12.2 ?--Monitor for signs of bleeding. ?            --  hgb up to 13.8 4/5  ? 4/10- Hb 13.4-  ?13. H/o Cervical DDD w/cord compression:  s/p ACDF with residual numbness of bilateral hands ?14. Lumbar stenosis: S/p decompression Jan 2023. ?4/11- will do Xrays of lumbar spine since pt had fall and is worried hurt hardware.   ?15. Impaired fasting glucose: Hgb A1c-5.4.  ?16. Transaminitis: mild elevation 4/5 ? -likely reactive. F/u Monday ? 4/10- LFTs back to baseline-  ?17. Spastic left hemiparesis ? -resting orthotics ordered, positioning/stretching by therapy ? --continue tizanidine to 2mg  TID ? -likely botox candidate as outpt ? -discussed that baclofen will also help with spasticity ? 4/11- will start Dantrolene- 50 mg QHS- think could help the spasticity- will also start Tramadol 50 mg q6 hours since so painful and on nothing except tylenol.  ?18. Hiccups: start baclofen 5mg  HS. Add additional 5mg  baclofen after supper when hiccups are worst ? 4/11- con't Baclofen- helping hiccups-  ? ?I spent a total of 54   minutes on total care today- >50% coordination of care- due to team conference; prolonged d/w pt about spasticity/pain and back; also d/w nursing and team about medical issues as above.  ? ? ?  ? ?LOS: ?7 days ?A FACE TO FACE EVALUATION WAS PERFORMED ? ?Barabara Motz ?12/15/2021, 8:55 AM  ? ?  ?

## 2021-12-15 NOTE — Progress Notes (Signed)
Occupational Therapy Session Note ? ?Patient Details  ?Name: Patrick Rose ?MRN: [id number] ?Date of Birth: April 06, 1951 ? ?Today's Date: 12/15/2021 ?OT Individual Time: 1100-1200 ?OT Individual Time Calculation (min): 60 min  ? ? ?Short Term Goals: ?Week 1:  OT Short Term Goal 1 (Week 1): Pt will maintain static sitting balance with max cueing and mod A ?OT Short Term Goal 2 (Week 1): Pt will don a shirt with mod A ?OT Short Term Goal 3 (Week 1): Pt will complete a sit <> stand with LRAD with max A of 1 ?OT Short Term Goal 4 (Week 1): Pt will demo improved sustained attention, requiring only mod cueing to attend to one grooming tasks ? ?Skilled Therapeutic Interventions/Progress Updates:  ?  Pt received sitting up in the w/c with no c/o pain at rest. Session completed with OT as skilled +2 d/t complexity of pt deficits. Focus of session on focused attention, midline awareness/attention, proprioception, and functional transfers. Pt taken into quiet gym to control environmental distractions. Spent a lot of extra time on midline awareness and allowing pt to problem solve/follow cueing to come into midline. MAX multimodal cueing required and mod-max A. Heavy facilitation to block pushing of the R UE. His attention deficits are so severe that his RUE in unable to break pushing for more than several seconds at a time. Pt came to standing in the stedy with max A +2 with severe L lean and his L LE went into extensor and then flexor tone- coming off the stedy step and behind his RUE, requiring total A +2 to safely return to the TIS. Used a slideboard to transfer pt back to the bed, wife used as a 3rd person to provide a R anchor. Severe motor planning deficits, requiring total A +2 for transfer back to bed. Rolling R and L for peri hygiene supine d/t BM incontinence. Worked on gentle PROM at his L hip to break up tone and assist with pain management- which it was very successful ! He was left supine with all needs met, bed  alarm set. Wife [name] present.  ? ? ?Saebo Stim One was applied to pt's L tricep for muscle activation and to promote attention to the L side and LUE. No c/o pain before/after tx and no adverse skin reactions.  ?330 pulse width ?35 Hz pulse rate ?On 8 sec/ off 8 sec ?Ramp up/ down 2 sec ?Symmetrical Biphasic wave form  ?Max intensity 140m at 500 Ohm load ? ? ?Therapy Documentation ?Precautions:  ?Precautions ?Precautions: Fall ?Precaution Comments: dense L hemipareisis with UE/LE tone, pushes to L, limited focus ?Restrictions ?Weight Bearing Restrictions: No ? ? ?Therapy/Group: Individual Therapy ? ?SCurtis Sites?12/15/2021, 6:20 AM ?

## 2021-12-15 NOTE — Progress Notes (Signed)
Rash much improved with decrease in itching. Discussed with patient and wife --agreeable to continue and monitor on Eliquis for now.  ? ?Right flank ? ? ?

## 2021-12-15 NOTE — Progress Notes (Addendum)
Speech Language Pathology Daily Session Note ? ?Patient Details  ?Name: Patrick Rose ?MRN: [id number] ?Date of Birth: [birth year] ? ?Today's Date: 12/15/2021 ?SLP Individual Time: 0900-1000 ?SLP Individual Time Calculation (min): 60 min ? ?Short Term Goals: ?Week 2: SLP Short Term Goal 1 (Week 2): Pt will consume current diet Dys 2 textures and Nectar thick liquids with mod A verbal cues to correct left spillage and pocketing. ?SLP Short Term Goal 2 (Week 2): Pt will consume thin liquid trials via cup with overt s/s of aspiration in less than 25% of trials with Mod verbal cues over 2 sessions prior to MBS. ?SLP Short Term Goal 3 (Week 2): Pt will demonstrate use of speech intelligibility strategies at the sentence level producing 80% intelligibility with min A verbal cues. ?SLP Short Term Goal 4 (Week 2): Patient will demonstrate sustained attention to functional tasks for 3 minutes with Max A verbal cues. ?SLP Short Term Goal 5 (Week 2): Patient will scan/attend to left field of enviornment during functional tasks with Mod verbal cues in 50% of opportunities. ?SLP Short Term Goal 6 (Week 2): Patient will demonstrate functional problem solving for basic and familiar tasks with Mod verbal cues. ? ?Skilled Therapeutic Interventions: ?Pt seen, this AM, for skilled ST intervention targeting dysphagia and cognitive-linguistic goals outlined above. Pt encountered sitting upright in TIS, eating breakfast. Wife present and verbally cueing pt to attend to meal. Recently finished with PT who reports poor focused attention and increased spasticity. Agreeable to ST intervention at bedside.  ? ?SLP facilitated today's session by providing skilled pt and family education re: dysphagia + cognitive-linguistic impairment s/p R CVA, rationale for modified diet, importance of suctioning and oral care after meals, how to thicken thin liquids to nectar-thick consistency with Simply Thick gel packets, and use of cold liquids from  refrigerator vs with ice. [name] verbalized understanding, while pt demonstrated some emergent awareness of deficits and rationale for tx given Min-Mod verbal A following education. Additionally, SLP facilitated implementation of aspiration precautions (small + single bites/sips, slow rate, L lingual sweep for oral pocketing on L, and alternating bites/sips) with frequent Max verbal and visual/model prompts to maintain focused attention, decrease impulsivity, and clear L buccal cavity prior to initiation of subsequent bites. Following PO intake, pt completed adequate suctioning and oral care with Mod A.  ? ?Re: cognitive-linguistic skills, pt oriented x 3 independently; x 4 given Min verbal A for the date. Attended to conversational task re: his dog for 5 minutes with no cues for redirection. Of note, focused attention appears to be worse in the setting of functional vs verbal, basic tasks. Attended to the left visual field to name common objects on 100% of opportunities given Mod-Max visual and verbal A.   ? ?Session concluded with pt in TIS, and wife present. Call bell within reach and all immediate needs met. Continue per current ST POC. ? ?Pain ?No/Denies pain; NAD ? ?Therapy/Group: Individual Therapy ? ?Patrick Rose A Patrick Rose ?12/15/2021, 11:00 AM ?

## 2021-12-16 DIAGNOSIS — I63511 Cerebral infarction due to unspecified occlusion or stenosis of right middle cerebral artery: Secondary | ICD-10-CM | POA: Diagnosis not present

## 2021-12-16 MED ORDER — TIZANIDINE HCL 2 MG PO TABS
2.0000 mg | ORAL_TABLET | Freq: Two times a day (BID) | ORAL | Status: DC
  Administered 2021-12-17: 2 mg via ORAL
  Filled 2021-12-16: qty 1

## 2021-12-16 NOTE — Progress Notes (Signed)
Occupational Therapy Session Note ? ?Patient Details  ?Name: Patrick Rose ?MRN: [id number] ?Date of Birth: [birth year] ? ?Today's Date: 12/16/2021 ?OT Individual Time: 6986-1483 ?OT Individual Time Calculation (min): 20 min  ? ? ?Short Term Goals: ?Week 1:  OT Short Term Goal 1 (Week 1): Pt will maintain static sitting balance with max cueing and mod A ?OT Short Term Goal 1 - Progress (Week 1): Met ?OT Short Term Goal 2 (Week 1): Pt will don a shirt with mod A ?OT Short Term Goal 2 - Progress (Week 1): Progressing toward goal ?OT Short Term Goal 3 (Week 1): Pt will complete a sit <> stand with LRAD with max A of 1 ?OT Short Term Goal 3 - Progress (Week 1): Progressing toward goal ?OT Short Term Goal 4 (Week 1): Pt will demo improved sustained attention, requiring only mod cueing to attend to one grooming tasks ?OT Short Term Goal 4 - Progress (Week 1): Progressing toward goal ? ?Skilled Therapeutic Interventions/Progress Updates:  ?  1:1. Pt received in bed agreeable to OT focusing on RNMR, R attention, PROM of R hand and focused attention. Reduced stimuli to room by closing door and turning off TV. Pt able to intermittently attend to RUE and with repetition flex/activate trace bicep with tapping and multimodal cuing for visual attention/fixation. Pt provided intermittent breaks as needed with decreased arousal towards end of session. Exited session with pt seated in bed, exit alarm on and call light in reach ? ? ?Therapy Documentation ?Precautions:  ?Precautions ?Precautions: Fall ?Precaution Comments: dense L hemipareisis with UE/LE tone, pushes to L, limited focus ?Restrictions ?Weight Bearing Restrictions: No ?General: ?  ?Vital Signs: ? ? ? ?Therapy/Group: Individual Therapy ? ?Lowella Dell Dalal Livengood ?12/16/2021, 2:32 PM ?

## 2021-12-16 NOTE — Progress Notes (Signed)
Speech Language Pathology Daily Session Note ? ?Patient Details  ?Name: Patrick Rose ?MRN: [id number] ?Date of Birth: 1951/05/31 ? ?Today's Date: 12/16/2021 ?SLP Individual Time: 1000-1045 ?SLP Individual Time Calculation (min): 45 min ? ?Short Term Goals: ?Week 2: SLP Short Term Goal 1 (Week 2): Pt will consume current diet Dys 2 textures and Nectar thick liquids with mod A verbal cues to correct left spillage and pocketing. ?SLP Short Term Goal 2 (Week 2): Pt will consume thin liquid trials via cup with overt s/s of aspiration in less than 25% of trials with Mod verbal cues over 2 sessions prior to MBS. ?SLP Short Term Goal 3 (Week 2): Pt will demonstrate use of speech intelligibility strategies at the sentence level producing 80% intelligibility with min A verbal cues. ?SLP Short Term Goal 4 (Week 2): Patient will demonstrate sustained attention to functional tasks for 3 minutes with Max A verbal cues. ?SLP Short Term Goal 5 (Week 2): Patient will scan/attend to left field of enviornment during functional tasks with Mod verbal cues in 50% of opportunities. ?SLP Short Term Goal 6 (Week 2): Patient will demonstrate functional problem solving for basic and familiar tasks with Mod verbal cues. ? ?Skilled Therapeutic Interventions:Skilled ST services focused on cognitive skills. SLP facilitated basic problem solving, error awareness and sustained attention in card sorting task by 6 colors and 5 shapes as well as Blink played in basic form. Pt demonstrated ability to sort by color mod I and supervision A verbal cues to sort by shape for error awareness as well as in novel card task Blink. Pt required supervision A verbal cues to scan left of midline in table top task, however required mod A verbal cues to bring head to neutral position with bio-feed back from mirror last less than 30 seconds. Pt was left in room with wife, call bell within reach and chair alarm set. SLP recommends to continue skilled services. ?    ? ?Pain ?Pain Assessment ?Pain Score: 0-No pain ? ?Therapy/Group: Individual Therapy ? ?Jenay Morici ?12/16/2021, 2:31 PM ?

## 2021-12-16 NOTE — Progress Notes (Signed)
Physical Therapy Weekly Progress Note ? ?Patient Details  ?Name: Patrick Rose ?MRN: [id number] ?Date of Birth: 11/10/1950 ? ?Beginning of progress report period: December 09, 2021 ?End of progress report period: December 16, 2021 ? ?Today's Date: 12/16/2021 ?PT Individual Time: 1055-1200 ?PT Individual Time Calculation (min): 65 min  ? ?Patient has met 2 of 4 short term goals.  Patient with slow, but steady progress this week, limited by flexor tone and spasticity of his L lower extremity, decreased attention and motor planning with functional mobility, and poor midline orientation in sitting and standing. Patient currently requires mod-max A of 1-2 for bed mobility, max-total A +2 for lateral scoot transfers, mod-max A +2 for sit to stand with and without L upper extremity support, and up to 60 feet of therapeutic gait training with max A +3 with w/c follow. [name] wife has been present for many sessions and family education of therapy goals, POC, and burden of care at d/c have been initiated. Will initiate hands on family training closer to d/c. ? ?Patient continues to demonstrate the following deficits muscle weakness and muscle joint tightness, decreased cardiorespiratoy endurance, abnormal tone, unbalanced muscle activation, and decreased motor planning, decreased midline orientation and decreased attention to left, decreased attention, decreased awareness, decreased problem solving, decreased safety awareness, and decreased memory, and decreased sitting balance, decreased standing balance, decreased postural control, hemiplegia, decreased balance strategies, and difficulty maintaining precautions and therefore will continue to benefit from skilled PT intervention to increase functional independence with mobility. ? ?Patient progressing toward long term goals..  Continue plan of care. ? ?PT Short Term Goals ?Week 1:  PT Short Term Goal 1 (Week 1): Pt will perform bed mobility with consistent MaxA and imrproved  ability to follow instructions. ?PT Short Term Goal 1 - Progress (Week 1): Progressing toward goal ?PT Short Term Goal 2 (Week 1): Pt will perform sit<>stand with consistent Mod/ Max +2. ?PT Short Term Goal 2 - Progress (Week 1): Met ?PT Short Term Goal 3 (Week 1): Pt will perform seat to seat transfers with consistent MaxA +2 ?PT Short Term Goal 3 - Progress (Week 1): Progressing toward goal ?PT Short Term Goal 4 (Week 1): Pt will demonstrate improved balance with decreased pushing to L side to <50%. ?PT Short Term Goal 4 - Progress (Week 1): Met ?Week 2:  PT Short Term Goal 1 (Week 2): Pt will perform bed mobility with consistent MaxA and imrproved ability to follow instructions. ?PT Short Term Goal 2 (Week 2): Pt will perform sit<>stand with Max A using LRAD. ?PT Short Term Goal 3 (Week 2): Patient will perform sitting balance with supervision >2 min with mod cues for midline orientation. ?PT Short Term Goal 4 (Week 2): Patient will perform chair<>chair transfers with mod A +2. ? ?Skilled Therapeutic Interventions/Progress Updates:  ?   ?Patient in [place] w/c with his wife in the room upon PT arrival. Patient alert and agreeable to PT session. Patient reported 6-8/10 L hip pain during session with increased flexor tone with spasticity, RN made aware. Reports improved flexor tone throughout the night last night. PT provided repositioning, rest breaks, and distraction as pain interventions throughout session.  ? ?Patient transported to the fourth floor to focus on therapeutic gait training for neuromuscular reeducation of B lower extremities with a functional activity. Session was co-treated with Bethann Berkshire, PT, DPT, SCRS, NCS for additional skilled facilitation with gait training and clinical expertise.  ? ?Therapeutic Activity: ?Transfers: Patient performed sit to/from stand x4 with  max A +2 progressing to mod-max A +2 with 3 musketeer technique and x1 with mod A +2 L upper extremity support on the Lite Gait. Provided  multimodal cues for forward and R weight/trunk shift, foot placement due to L leg with flexor tone and R leg pushing into abduction, and use of L hand on arm rest to push up/reach back. ?Patient stood >2 min x1 with max A +2 focused on midline orientation and progressed to weight shifting to plan for therapeutic gait.  ?Scooting back and center in the chair x3 with max progressing to mod A with facilitation and visual target for forward and L weight shift to off weight hips for scooting.  ? ?Neuromuscular Re-ed: ?Therapeutic Gait training: ?Patient ambulated >10 feet x1 and >50 feet x3 ? ?Trial 1 and 2: using 3 musketeer technique with max A +2 and total A L limb advancement using leg strap for advancement with w/c follow and 4th person for visual attention with mirror and audible cues to look up at the mirror  ? ?Trial 3: added a third person assist at L trunk to allow for increased focus for therapist assist the L leg with improved patient initiation of advancement and midline posture  ? ?Trial 4: in the Lite gait with +2 assist for weight shift and L limb advancement and a third person for management of progressing the Lite Gait   ? ?Focused on midline posture, attention to task, minimizing cuing to one person to assist with stimulation/attention, R weight shift, maintaining control of L hand to keep from pushing, reduced L flexor tone on L in stance, and sequencing with improved reciprocal pattern with increased repetition. ? ?Incorporated facilitation and cues for sitting posture and midline orientation during rest breaks with use of the mirror for visual feedback and max cues for attention to the mirror and initiation of self correction to midline. ? ?Patient in TIS w/c with his with his wife in the room at end of session with breaks locked, seat belt alarm set, and all needs within reach.  ? ?Therapy Documentation ?Precautions:  ?Precautions ?Precautions: Fall ?Precaution Comments: dense L hemipareisis with  UE/LE tone, pushes to L, limited focus ?Restrictions ?Weight Bearing Restrictions: No ? ? ?Therapy/Group: Individual Therapy ? ?Doreene Burke PT, DPT ? ?12/16/2021, 4:35 PM  ?

## 2021-12-16 NOTE — Progress Notes (Signed)
Occupational Therapy Session Note ? ?Patient Details  ?Name: Patrick Rose ?MRN: [id number] ?Date of Birth: May 01, 1951 ? ?Today's Date: 12/16/2021 ?OT Individual Time: 1300-1400 ?OT Individual Time Calculation (min): 60 min  ? ? ?Short Term Goals: ?Week 2:  OT Short Term Goal 1 (Week 2): Pt will sit EOB with no more than mod A with max cueing for focused attention ?OT Short Term Goal 2 (Week 2): Pt will don a shirt with max A ?OT Short Term Goal 3 (Week 2): Pt will demonstrated sustained attention to UB dressing or bathing task with max cueing ?OT Short Term Goal 4 (Week 2): [name] caregiver will demonstrate competency with LUE PROM HEP ? ?Skilled Therapeutic Interventions/Progress Updates:  ?  Pt received in the TIS w/c with no pain at rest. He was taken to the therapy gym via w/c. Worked on LUE NMR with the use of the saebo to both initiate muscle activation and promote increased visual attention to his L side. He was unable to volitionally activate his LUE other than a twitch in his L thumb but still suspect pt has more active movement d/t large AROM stimulated by low setting on the saebo. Pt completed a functional reaching task toward the R side to promote midline orientation and reduce pushing. He returned to his room and completed a max-total A +2 slideboard but with improved motor planning/LE activation and able to better follow cueing for head/trunk positioning. Pt required max +2 assist for brief change at bed level. He was left supine with all needs met, bed alarm set.  ? ?Therapy Documentation ?Precautions:  ?Precautions ?Precautions: Fall ?Precaution Comments: dense L hemipareisis with UE/LE tone, pushes to L, limited focus ?Restrictions ?Weight Bearing Restrictions: No ? ? ?Therapy/Group: Individual Therapy ? ?Curtis Sites ?12/16/2021, 10:33 AM ?

## 2021-12-16 NOTE — Progress Notes (Signed)
?                                                       PROGRESS NOTE ? ? ?Subjective/Complaints: ?Very sleepy during the day, discussed that this is likely from tizanidine. Discussed decreasing to 2mg  BID and patient and wife are agreeable. Discussed outpatient Botox and can wean off medications after this. ? ?ROS:  ?Pt denies SOB, abd pain, CP, N/V/C/ (+)D, and vision changes, +daytime somnolence ? ?Objective: ?  ?DG Lumbar Spine Complete ? ?Result Date: 12/15/2021 ?CLINICAL DATA:  Fall approximately 1 month ago. Prior lumbar fusion. EXAM: LUMBAR SPINE - COMPLETE 4+ VIEW COMPARISON:  Lumbar spine radiographs 01/12/2019 and lumbar spine MRI 09/02/2021 FINDINGS: There are 5 non rib-bearing lumbar type vertebrae. There is chronic trace anterolisthesis of L4 on L5, and there is mild straightening of the normal lumbar lordosis. There has been interval posterior and interbody fusion at L4-5. No fracture is identified. Anterior endplate spurring elsewhere in the lumbar and lower thoracic spine is similar to the prior radiographs. Lumbar intervertebral disc space heights are preserved. Mild-to-moderate lumbar facet arthrosis is noted. IMPRESSION: 1. No acute osseous abnormality identified. 2. Interval L4-5 fusion. Electronically Signed   By: Logan Bores M.D.   On: 12/15/2021 15:17   ?Recent Labs  ?  12/14/21 ?[id number]  ?WBC 10.5  ?HGB 13.4  ?HCT 39.5  ?PLT 255  ? ? ?Recent Labs  ?  12/14/21 ?[id number]  ?NA 139  ?K 4.0  ?CL 108  ?CO2 25  ?GLUCOSE 126*  ?BUN 21  ?CREATININE 0.97  ?CALCIUM 9.1  ? ? ? ?Intake/Output Summary (Last 24 hours) at 12/16/2021 1720 ?Last data filed at 12/16/2021 1300 ?Gross per 24 hour  ?Intake 840 ml  ?Output --  ?Net 840 ml  ?  ? ?  ? ?Physical Exam: ?Vital Signs ?Blood pressure (!) 120/96, pulse 80, temperature 98 ?F (36.7 ?C), resp. rate 17, height 6\' 1"  (1.854 m), SpO2 95 %. ? ?Gen: no distress, normal appearing ?HEENT: oral mucosa pink and moist, NCAT ?Cardio: Reg rate ?Chest: normal effort, normal rate  of breathing ?Abd: soft, non-distended ?Ext: no edema ?Psychiatric: appropriate- impulsive; anxious ?Neurological: alert- severe spasticity of L hip/knee- won't let me examine due to pain, but bringing L hip almost to chest  ? ?Skin: maculopapular rash along back. Multiple bruises Right thigh upper exts. R hip/groin purple from bruising  ?Neuro:  Pt is alert to person, place, reason. Follows simple commands. Left central 7 and left hemianopsia. Dysarthric. LUE and LLE 0/5 with significant flexor tone LUE  1/4, and flex/ext tone LLE 2-3/4. Left SCM also tight 2/4.  DTR's 3+ on left ?Musculoskeletal: mild left hip pain, no limb pain with palpation  ? ? ?Assessment/Plan: ?1. Functional deficits which require 3+ hours per day of interdisciplinary therapy in a comprehensive inpatient rehab setting. ?Physiatrist is providing close team supervision and 24 hour management of active medical problems listed below. ?Physiatrist and rehab team continue to assess barriers to discharge/monitor patient progress toward functional and medical goals ? ?Care Tool: ? ?Bathing ? Bathing activity did not occur: Safety/medical concerns ?Body parts bathed by patient: Chest, Abdomen, Front perineal area, Right upper leg, Left upper leg, Face, Left arm  ? Body parts bathed by helper: Right arm, Buttocks, Right lower leg, Left lower leg ?  ?  ?  Bathing assist Assist Level: Maximal Assistance - Patient 24 - 49% ?  ?  ?Upper Body Dressing/Undressing ?Upper body dressing Upper body dressing/undressing activity did not occur (including orthotics): Safety/medical concerns ?What is the patient wearing?: Pull over shirt ?   ?Upper body assist Assist Level: 2 Helpers ?   ?Lower Body Dressing/Undressing ?Lower body dressing ? ? ?   ?What is the patient wearing?: Pants ? ?  ? ?Lower body assist Assist for lower body dressing: 2 Helpers ?   ? ?Toileting ?Toileting    ?Toileting assist Assist for toileting: 2 Helpers ?  ?  ?Transfers ?Chair/bed  transfer ? ?Transfers assist ? Chair/bed transfer activity did not occur: Safety/medical concerns ? ?Chair/bed transfer assist level: 2 Helpers ?  ?  ?Locomotion ?Ambulation ? ? ?Ambulation assist ? ? Ambulation activity did not occur: Safety/medical concerns ? ?  ?  ?   ? ?Walk 10 feet activity ? ? ?Assist ? Walk 10 feet activity did not occur: Safety/medical concerns ? ?  ?   ? ?Walk 50 feet activity ? ? ?Assist Walk 50 feet with 2 turns activity did not occur: Safety/medical concerns ? ?  ?   ? ? ?Walk 150 feet activity ? ? ?Assist Walk 150 feet activity did not occur: Safety/medical concerns ? ?  ?  ?  ? ?Walk 10 feet on uneven surface  ?activity ? ? ?Assist Walk 10 feet on uneven surfaces activity did not occur: Safety/medical concerns ? ? ?  ?   ? ?Wheelchair ? ? ? ? ?Assist Is the patient using a wheelchair?: Yes ?Type of Wheelchair: Manual ?Wheelchair activity did not occur: Safety/medical concerns ? ?  ?   ? ? ?Wheelchair 50 feet with 2 turns activity ? ? ? ?Assist ? ?  ?Wheelchair 50 feet with 2 turns activity did not occur: Safety/medical concerns ? ? ?   ? ?Wheelchair 150 feet activity  ? ? ? ?Assist ? Wheelchair 150 feet activity did not occur: Safety/medical concerns ? ? ?   ? ?Blood pressure (!) 120/96, pulse 80, temperature 98 ?F (36.7 ?C), resp. rate 17, height 6\' 1"  (1.854 m), SpO2 95 %. ? ? Medical Problem List and Plan: ?1. Functional deficits secondary to right MCA infarct likely embolic d/t  atrial fib ?            -patient may  shower ?            -ELOS/Goals: 14-20 days, min assist with PT, OT, SLP ? Continue CIR- PT, OT and SLP ?2.  Antithrombotics: ?-DVT/anticoagulation:  Pharmaceutical: Other (comment)--Eliquis started on 04/04. Dopplers clean ?            -antiplatelet therapy: N/A ?3. Left hip pain/Pain Management:   ?-xr without fx,?mild HO--> focus on ROM, spasticity control ?--Tylenol prn. Voltaren gel for local measures.  ?-add kpad ?4/11- kapd ordered again; add tramadol 50 mg q6  hours for pain; spasticity as below ?4. Mood: LCSW to follow for evaluation and support.  ?            -antipsychotic agents: N/A ?5. Neuropsych: This patient maybe intermittently capable of making decisions on his own behalf. ?6. Skin/Wound Care: Routine pressure relief measures.  ? -hydrocortisone for rash on back ?7. Fluids/Electrolytes/Nutrition: encourage PO..   ?   ?8. A fib: Monitor HR TID-HR in 90's ?--monitor for symptoms with activity.  ?4/11- HR fluctuating under 100-  ?9. Dysphagia: Continue D2, nectar liquids with supervision for safety ?-encourage  fluids, labs ok ?10. Hyperlipidemia: LDL- continue Crestor 20 mg  ?11. Constipation:  continue Senna S  BID ?             4/7 multiple bm's after sorbitol,etc ? 4/11- stop Senna since pt's stools are pasty/frequent/incontinent ?12. Acute blood loss anemia: Hbg down from 14.4-->12.2 ?--Monitor for signs of bleeding. ?            --hgb up to 13.8 4/5  ? 4/10- Hb 13.4-  ?13. H/o Cervical DDD w/cord compression:  s/p ACDF with residual numbness of bilateral hands ?14. Lumbar stenosis: S/p decompression Jan 2023. ?4/11- will do Xrays of lumbar spine since pt had fall and is worried hurt hardware.   ?15. Impaired fasting glucose: Hgb A1c-5.4.  ?16. Transaminitis: mild elevation 4/5 ? -likely reactive. F/u Monday ? 4/10- LFTs back to baseline-  ?17. Spastic left hemiparesis ? -resting orthotics ordered, positioning/stretching by therapy ? --continue tizanidine to 2mg  TID ? -likely botox candidate as outpt ? -discussed that baclofen will also help with spasticity ? Continue Dantrolene- 50 mg QHS- think could help the spasticity- will also start Tramadol 50 mg q6 hours since so painful and on nothing except tylenol.  ?18. Hiccups: start baclofen 5mg  HS. Add additional 5mg  baclofen after supper when hiccups are worst ? Continue Baclofen- helping hiccups-  ?19. Daytime somnolence: discussed decreasing Tizanidine to 2mg  BID and patient and wife are agreeable.  ? ? ? ?   ? ?LOS: ?8 days ?A FACE TO FACE EVALUATION WAS PERFORMED ? ?Martha Clan P Shomari Scicchitano ?12/16/2021, 5:20 PM  ? ?  ?

## 2021-12-16 NOTE — Plan of Care (Signed)
?  Problem: RH BOWEL ELIMINATION ?Goal: RH STG MANAGE BOWEL WITH ASSISTANCE ?Description: STG Manage Bowel with Min Assistance. ?Outcome: Progressing ?Goal: RH STG MANAGE BOWEL W/MEDICATION W/ASSISTANCE ?Description: STG Manage Bowel with Medication with Min Assistance. ?Outcome: Progressing ?  ?Problem: RH BLADDER ELIMINATION ?Goal: RH STG MANAGE BLADDER WITH ASSISTANCE ?Description: STG Manage Bladder With Min Assistance ?Outcome: Progressing ?Goal: RH STG MANAGE BLADDER WITH MEDICATION WITH ASSISTANCE ?Description: STG Manage Bladder With Medication With Min Assistance. ?Outcome: Progressing ?  ?Problem: RH SKIN INTEGRITY ?Goal: RH STG MAINTAIN SKIN INTEGRITY WITH ASSISTANCE ?Description: STG Maintain Skin Integrity With Min Assistance. ?Outcome: Progressing ?Goal: RH STG ABLE TO PERFORM INCISION/WOUND CARE W/ASSISTANCE ?Description: STG Able To Perform Incision/Wound Care With Min Assistance. ?Outcome: Progressing ?  ?Problem: RH SAFETY ?Goal: RH STG ADHERE TO SAFETY PRECAUTIONS W/ASSISTANCE/DEVICE ?Description: STG Adhere to Safety Precautions With Cues and Reminders. ?Outcome: Progressing ?  ?Problem: RH COGNITION-NURSING ?Goal: RH STG ANTICIPATES NEEDS/CALLS FOR ASSIST W/ASSIST/CUES ?Description: STG Anticipates Needs/Calls for Assist With Cues and Reminders. ?Outcome: Progressing ?  ?Problem: RH KNOWLEDGE DEFICIT ?Goal: RH STG INCREASE KNOWLEDGE OF STROKE PROPHYLAXIS ?Description: Patient will demonstrate knowledge of medications used to prevent future stokes with educational materials and handouts provided by staff independently at discharge. ?Outcome: Progressing ?  ?Problem: Consults ?Goal: RH STROKE PATIENT EDUCATION ?Description: See Patient Education module for education specifics  ?Outcome: Progressing ?  ?

## 2021-12-16 NOTE — Progress Notes (Signed)
Occupational Therapy Weekly Progress Note ? ?Patient Details  ?Name: Patrick Rose ?MRN: [id number] ?Date of Birth: 09-16-1950 ? ?Beginning of progress report period: 12/09/21 ?End of progress report period: 12/16/21 ? ?Today's Date: 12/16/2021 ?OT Individual Time: [phone] ?OT Individual Time Calculation (min): 55 min  ? ? ?Patient has met 1 of 4 short term goals.  Patrick Rose has made slow progress this past week, limited by severe pain and frequent spasms in his L hip and his profound attention deficits. He has made subtle improvements in L attention/scanning, sitting balance, and standing balance but remains a max +2 for all ADLs. His LUE remains limited by flexor tone but he has a great response to the saebo and enjoys its use. He likely has volitional movement but d/t severe inattention he is unable to activate. His wife is very supportive and always present.  ? ?Patient continues to demonstrate the following deficits: muscle weakness and muscle joint tightness, impaired timing and sequencing, abnormal tone, unbalanced muscle activation, ataxia, decreased coordination, and decreased motor planning, decreased midline orientation and decreased attention to left, decreased initiation, decreased attention, decreased awareness, decreased problem solving, decreased safety awareness, decreased memory, and delayed processing, and decreased sitting balance, decreased standing balance, decreased postural control, hemiplegia, and decreased balance strategies and therefore will continue to benefit from skilled OT intervention to enhance overall performance with BADL and Reduce care partner burden. ? ?Patient progressing toward long term goals..  Continue plan of care. ? ?OT Short Term Goals ?Week 1:  OT Short Term Goal 1 (Week 1): Pt will maintain static sitting balance with max cueing and mod A ?OT Short Term Goal 1 - Progress (Week 1): Met ?OT Short Term Goal 2 (Week 1): Pt will don a shirt with mod A ?OT Short Term Goal 2 -  Progress (Week 1): Progressing toward goal ?OT Short Term Goal 3 (Week 1): Pt will complete a sit <> stand with LRAD with max A of 1 ?OT Short Term Goal 3 - Progress (Week 1): Progressing toward goal ?OT Short Term Goal 4 (Week 1): Pt will demo improved sustained attention, requiring only mod cueing to attend to one grooming tasks ?OT Short Term Goal 4 - Progress (Week 1): Progressing toward goal ?Week 2:  OT Short Term Goal 1 (Week 2): Pt will sit EOB with no more than mod A with max cueing for focused attention ?OT Short Term Goal 2 (Week 2): Pt will don a shirt with max A ?OT Short Term Goal 3 (Week 2): Pt will demonstrated sustained attention to UB dressing or bathing task with max cueing ?OT Short Term Goal 4 (Week 2): [name] caregiver will demonstrate competency with LUE PROM HEP ? ?Skilled Therapeutic Interventions/Progress Updates:  ?  Pt received supine with no c/o pain, agreeable to OT session. Improvements this session in L scanning, sitting balance, and in management of L hip tone. Completed preparatory L hip mobilizations to assist with reduction of tone- very sucessful in pain management while rolling. Pt saturated in urine upon arrival d/t primo catheter not on correctly and leaking. He rolled R with min A and L with max A for bed change and peri hygiene. He required max cueing for management of the LUE. He transferred to EOB with max +2 assist for lifting trunk. With RUE support on the bed rail pt was able to maintain sitting balance for a minute with CGA! Forward flexed head and trunk with L lean. R pushing still severe with severe attention deficits.  He donned a shirt EOB with mod A but required max A for balance when releasing the rail. He stood from EOB with 3 musketeers technique with mod +2 assist once standing. Completed a squat pivot to the w/c with max A +2. At the rail worked on static standing with focus on upright standing, head righting on body, and glute activation. Pt returned to his  room. Pt was left sitting up in the TIS with all needs met, chair alarm set, and call bell within reach.  ? ?Saebo Stim One was placed on pt's L tricep for muscle activation and to promote L attention to body. No c/o pain or adverse skin reactions. 60 min unattended.  ?330 pulse width ?35 Hz pulse rate ?On 8 sec/ off 8 sec ?Ramp up/ down 2 sec ?Symmetrical Biphasic wave form  ?Max intensity 157m at 500 Ohm load ? ? ? ?Therapy Documentation ?Precautions:  ?Precautions ?Precautions: Fall ?Precaution Comments: dense L hemipareisis with UE/LE tone, pushes to L, limited focus ?Restrictions ?Weight Bearing Restrictions: No ?  ?Therapy/Group: Individual Therapy ? ?SCurtis Sites?12/16/2021, 10:16 AM  ?

## 2021-12-17 DIAGNOSIS — I63511 Cerebral infarction due to unspecified occlusion or stenosis of right middle cerebral artery: Secondary | ICD-10-CM | POA: Diagnosis not present

## 2021-12-17 MED ORDER — TIZANIDINE HCL 4 MG PO TABS
4.0000 mg | ORAL_TABLET | Freq: Every day | ORAL | Status: DC
  Administered 2021-12-18 – 2022-01-03 (×17): 4 mg via ORAL
  Filled 2021-12-17 (×18): qty 1

## 2021-12-17 NOTE — Progress Notes (Signed)
Occupational Therapy Session Note ? ?Patient Details  ?Name: DARRIOUS YOUMAN ?MRN: [id number] ?Date of Birth: [birth year] ? ?Today's Date: 12/17/2021 ?OT Individual Time: 1959-7471 ?OT Individual Time Calculation (min): 58 min  ? ? ?Short Term Goals: ?Week 1:  OT Short Term Goal 1 (Week 1): Pt will maintain static sitting balance with max cueing and mod A ?OT Short Term Goal 1 - Progress (Week 1): Met ?OT Short Term Goal 2 (Week 1): Pt will don a shirt with mod A ?OT Short Term Goal 2 - Progress (Week 1): Progressing toward goal ?OT Short Term Goal 3 (Week 1): Pt will complete a sit <> stand with LRAD with max A of 1 ?OT Short Term Goal 3 - Progress (Week 1): Progressing toward goal ?OT Short Term Goal 4 (Week 1): Pt will demo improved sustained attention, requiring only mod cueing to attend to one grooming tasks ?OT Short Term Goal 4 - Progress (Week 1): Progressing toward goal ?Week 2:  OT Short Term Goal 1 (Week 2): Pt will sit EOB with no more than mod A with max cueing for focused attention ?OT Short Term Goal 2 (Week 2): Pt will don a shirt with max A ?OT Short Term Goal 3 (Week 2): Pt will demonstrated sustained attention to UB dressing or bathing task with max cueing ?OT Short Term Goal 4 (Week 2): [name] caregiver will demonstrate competency with LUE PROM HEP ? ?Skilled Therapeutic Interventions/Progress Updates:  ?Patient met lying supine in bed in agreement with OT treatment session. Noted facial grimacing with movement of L hip. Patient reports receiving pain meds this a.m. No +2 assist available initially so UB bathing/dressing initiated in long-sitting. Total A +2 for UB/LB bathing/dressing at bed level with cues for attention to task, hemi technique and upright posture. Encouraged minimally distracting environment for all ADL tasks. Able to roll in supine with Max A to R and Total A +2 to L. Requires Max multimodal cues for completion of all tasks. L sidelying to EOB with Max-Total A. Patient demonstrates  strong push to the left. Somewhat improves with visual feedback from mirror and cross body R hand over L knee (maximal repeat cues to maintain position). Max-Total A +2 for slideboard transfer to R with increase cues for attention to task. Session concluded with patient seated in TIS wc with call bell within reach, belt alarm activated and all needs met.  ? ?Therapy Documentation ?Precautions:  ?Precautions ?Precautions: Fall ?Precaution Comments: dense L hemipareisis with UE/LE tone, pushes to L, limited focus ?Restrictions ?Weight Bearing Restrictions: No ?General: ?  ?Therapy/Group: Individual Therapy ? ?Antawan Mchugh R Howerton-Davis ?12/17/2021, 10:12 AM ?

## 2021-12-17 NOTE — Progress Notes (Signed)
Physical Therapy Session Note ? ?Patient Details  ?Name: Patrick Rose ?MRN: [id number] ?Date of Birth: 09-Dec-1950 ? ?Today's Date: 12/17/2021 ?PT Individual Time: 1660-6004 ?PT Individual Time Calculation (min): 60 min  ? ?Short Term Goals: ?Week 1:  PT Short Term Goal 1 (Week 1): Pt will perform bed mobility with consistent MaxA and imrproved ability to follow instructions. ?PT Short Term Goal 1 - Progress (Week 1): Progressing toward goal ?PT Short Term Goal 2 (Week 1): Pt will perform sit<>stand with consistent Mod/ Max +2. ?PT Short Term Goal 2 - Progress (Week 1): Met ?PT Short Term Goal 3 (Week 1): Pt will perform seat to seat transfers with consistent MaxA +2 ?PT Short Term Goal 3 - Progress (Week 1): Progressing toward goal ?PT Short Term Goal 4 (Week 1): Pt will demonstrate improved balance with decreased pushing to L side to <50%. ?PT Short Term Goal 4 - Progress (Week 1): Met ?Week 2:  PT Short Term Goal 1 (Week 2): Pt will perform bed mobility with consistent MaxA and imrproved ability to follow instructions. ?PT Short Term Goal 2 (Week 2): Pt will perform sit<>stand with Max A using LRAD. ?PT Short Term Goal 3 (Week 2): Patient will perform sitting balance with supervision >2 min with mod cues for midline orientation. ?PT Short Term Goal 4 (Week 2): Patient will perform chair<>chair transfers with mod A +2. ?Week 3:    ? ?Skilled Therapeutic Interventions/Progress Updates:  ?  Pt initially oob in TIS.  Transported to dayroom for session. ?In sitting, harness partially donned as pt participated in sitting balance - reaching to targets on R w/bias to encourage extension/upright posture.  Pt requires constant cueing to maintain attention to task, inner and environmental distractions are challenge. ?Pt is able to reach to R to achieve upright/midline wt shift, but unable to maintain.  Once achieves reaching goal, returns to pushing w/RUE/RLE. ? ?Multiple attempts to transition to upright standing in [place]  proved unsuccessful despite +2 PT, +1PT tech.  Pt c/o excessive groin pressure and found to be incontinent of urine as well. Returned to sitting and harness removed as above. ? ?Sit to stand in [place] w/+3 assist, third person assisting by stabilizing feet in neutral and guiding LUE forwards and upwards to prevent pushing w/transition.  Pt then assisted to perched sitting in st[place]. ?In this position, worked on reaching to R to colored beanbags again to encourage midline, erect sitting posture, then tosses to bucket to R to encourage maintaiing if only briefly.  Repeated this x 10, consistent cues to attend, + 2 guarding for safety, rests in perched sittng before repeating.   ? ?Pt transported to room.  Sliding board transfer w/reaching task to floor on R to aid w/board placement under L hip.  sliding board transfer wc to bed total assist.   ?Pt left supine w/rails up x 3, alarm set, bed in lowest position, and needs in reach. ? ? ? ?Therapy Documentation ?Precautions:  ?Precautions ?Precautions: Fall ?Precaution Comments: dense L hemipareisis with UE/LE tone, pushes to L, limited focus ?Restrictions ?Weight Bearing Restrictions: No ? ?Therapy/Group: Individual Therapy ?Callie Fielding, PT ? ? ?Jerrilyn Cairo ?12/17/2021, 4:05 PM  ?

## 2021-12-17 NOTE — Progress Notes (Addendum)
Speech Language Pathology Daily Session Note ? ?Patient Details  ?Name: Patrick Rose ?MRN: [id number] ?Date of Birth: May 27, 1951 ? ?Today's Date: 12/17/2021 ?SLP Individual Time: 1062-6948 ?SLP Individual Time Calculation (min): 45 min ? ?Short Term Goals: ?Week 2: SLP Short Term Goal 1 (Week 2): Pt will consume current diet Dys 2 textures and Nectar thick liquids with mod A verbal cues to correct left spillage and pocketing. ?SLP Short Term Goal 2 (Week 2): Pt will consume thin liquid trials via cup with overt s/s of aspiration in less than 25% of trials with Mod verbal cues over 2 sessions prior to MBS. ?SLP Short Term Goal 3 (Week 2): Pt will demonstrate use of speech intelligibility strategies at the sentence level producing 80% intelligibility with min A verbal cues. ?SLP Short Term Goal 4 (Week 2): Patient will demonstrate sustained attention to functional tasks for 3 minutes with Max A verbal cues. ?SLP Short Term Goal 5 (Week 2): Patient will scan/attend to left field of enviornment during functional tasks with Mod verbal cues in 50% of opportunities. ?SLP Short Term Goal 6 (Week 2): Patient will demonstrate functional problem solving for basic and familiar tasks with Mod verbal cues. ? ?Skilled Therapeutic Interventions: ?Pt seen, this AM, for skilled ST intervention targeitng dysphagia and cognitive-linguistic goals outlined above. Pt encountered drowsy and OOB in TIS with safety belt donned. Wife present initially; however, left shortly after SLP arrived. Reports she completed oral care at the conclusion of breakfast this morning. Pt agreeable to ST intervention in hospital room. Cooperative, albeit distractible throughout; easily redirected to task. ? ?SLP focused the majority of today's session on tolerance of ice chips and thin liquids to assess readiness for advance in liquid consistencies, which pt verbalized agreement with addressing and eager to drink water. Mod A provided for oral care to ensure  thorough cleaning; mild oral residuals noted within buccal cavity and along gum line. Provided re-education to pt and pt's wife re: importance re: use of [place] after all eating activities, and then completing oral care; they verbalized understanding.   ? ?Pt tolerated ice chips, tsp sips of thin water, 10 cc of thin water via medicine cup, and thin water via cup (~4 oz total) with no clinical s/sx concerning for aspiration; no immediate nor delayed throat clear or cough with no evidence of wet vocal quality upon phonation. Required Mod-Max verbal and visual A for focused attention to not talk while chewing ice and swallowing liquids. Additionally, required Max A for awareness to L anterior oral spillage and intermittent tactile use for improving lip seal around cup rim. Improvement noted in oral containment with less evidence of anterior spillage from L when implementing lip seal with head in neutral alignment vs extension. Will continue to assess tolerance of thin liquids in therapy, and may try trial tray of D2 and thin liquids in upcoming sessions, if pt continues to tolerate. ? ?Re: cognitive-linguistic skills, SLP provided skilled compensatory strategy training to target L inattention. Required Max verbal and visual A to complete functional basic task with locating dates on calendar on 100% of trials; 25% given Min A. Oriented x 3 independently; x 2 with Mod verbal and visual A. Improved intellectual and basic emergent awareness of current deficits given Min verbal A. Continues to benefit from Max A to maintain focused attention during functional task for 3 minutes. ? ?Session concluded with pt in TIS, safety belt donned, call bell reviewed and within reach, and all immediate needs met. Continue per  current ST POC. ? ?Pain ?No/Denies pain; NAD ? ?Therapy/Group: Individual Therapy ? ?Patrick Rose ?12/17/2021, 1:13 PM ?

## 2021-12-17 NOTE — Progress Notes (Signed)
?                                                       PROGRESS NOTE ? ? ?Subjective/Complaints: ?Continues to be very sleepy in the morning but also very tight. Discussed with patient and wife changing tizanidine from 2mg  BID to 4mg  HS. Discussed that he was also getting baclofen and dantrolene ? ?ROS:  ?Pt denies SOB, abd pain, CP, N/V/C/ (+)D, and vision changes, +daytime somnolence, hiccups improved ? ?Objective: ?  ?DG Lumbar Spine Complete ? ?Result Date: 12/15/2021 ?CLINICAL DATA:  Fall approximately 1 month ago. Prior lumbar fusion. EXAM: LUMBAR SPINE - COMPLETE 4+ VIEW COMPARISON:  Lumbar spine radiographs 01/12/2019 and lumbar spine MRI 09/02/2021 FINDINGS: There are 5 non rib-bearing lumbar type vertebrae. There is chronic trace anterolisthesis of L4 on L5, and there is mild straightening of the normal lumbar lordosis. There has been interval posterior and interbody fusion at L4-5. No fracture is identified. Anterior endplate spurring elsewhere in the lumbar and lower thoracic spine is similar to the prior radiographs. Lumbar intervertebral disc space heights are preserved. Mild-to-moderate lumbar facet arthrosis is noted. IMPRESSION: 1. No acute osseous abnormality identified. 2. Interval L4-5 fusion. Electronically Signed   By: 03/14/2019 M.D.   On: 12/15/2021 15:17   ?No results for input(s): WBC, HGB, HCT, PLT in the last 72 hours. ? ? ?No results for input(s): NA, K, CL, CO2, GLUCOSE, BUN, CREATININE, CALCIUM in the last 72 hours. ? ? ? ?Intake/Output Summary (Last 24 hours) at 12/17/2021 1245 ?Last data filed at 12/17/2021 0901 ?Gross per 24 hour  ?Intake 625 ml  ?Output 800 ml  ?Net -175 ml  ?  ? ?  ? ?Physical Exam: ?Vital Signs ?Blood pressure (!) 125/58, pulse (!) 53, temperature (!) 97.3 ?F (36.3 ?C), temperature source Oral, resp. rate 18, height 6\' 1"  (1.854 m), weight 89 kg, SpO2 94 %. ? ?Gen: no distress, normal appearing ?HEENT: oral mucosa pink and moist, NCAT ?Cardio:  Bradycardic ?Chest: normal effort, normal rate of breathing ?Abd: soft, non-distended ?Ext: no edema ?Psychiatric: appropriate- impulsive; anxious ?Neurological: alert- severe spasticity of L hip/knee- won't let me examine due to pain, but bringing L hip almost to chest  ? ?Skin: maculopapular rash along back. Multiple bruises Right thigh upper exts. R hip/groin purple from bruising  ?Neuro:  Pt is alert to person, place, reason. Follows simple commands. Left central 7 and left hemianopsia. Dysarthric. LUE and LLE 0/5 with significant flexor tone LUE  1/4, and flex/ext tone LLE 2-3/4. Left SCM also tight 2/4.  DTR's 3+ on left ?Musculoskeletal: mild left hip pain, no limb pain with palpation  ? ? ?Assessment/Plan: ?1. Functional deficits which require 3+ hours per day of interdisciplinary therapy in a comprehensive inpatient rehab setting. ?Physiatrist is providing close team supervision and 24 hour management of active medical problems listed below. ?Physiatrist and rehab team continue to assess barriers to discharge/monitor patient progress toward functional and medical goals ? ?Care Tool: ? ?Bathing ? Bathing activity did not occur: Safety/medical concerns ?Body parts bathed by patient: Chest, Abdomen, Front perineal area, Right upper leg, Left upper leg, Face, Left arm  ? Body parts bathed by helper: Right arm, Buttocks, Right lower leg, Left lower leg ?  ?  ?Bathing assist Assist Level: Maximal Assistance -  Patient 24 - 49% ?  ?  ?Upper Body Dressing/Undressing ?Upper body dressing Upper body dressing/undressing activity did not occur (including orthotics): Safety/medical concerns ?What is the patient wearing?: Pull over shirt ?   ?Upper body assist Assist Level: 2 Helpers ?   ?Lower Body Dressing/Undressing ?Lower body dressing ? ? ?   ?What is the patient wearing?: Pants ? ?  ? ?Lower body assist Assist for lower body dressing: 2 Helpers ?   ? ?Toileting ?Toileting    ?Toileting assist Assist for toileting: 2  Helpers ?  ?  ?Transfers ?Chair/bed transfer ? ?Transfers assist ? Chair/bed transfer activity did not occur: Safety/medical concerns ? ?Chair/bed transfer assist level: 2 Helpers ?  ?  ?Locomotion ?Ambulation ? ? ?Ambulation assist ? ? Ambulation activity did not occur: Safety/medical concerns ? ?  ?  ?   ? ?Walk 10 feet activity ? ? ?Assist ? Walk 10 feet activity did not occur: Safety/medical concerns ? ?  ?   ? ?Walk 50 feet activity ? ? ?Assist Walk 50 feet with 2 turns activity did not occur: Safety/medical concerns ? ?  ?   ? ? ?Walk 150 feet activity ? ? ?Assist Walk 150 feet activity did not occur: Safety/medical concerns ? ?  ?  ?  ? ?Walk 10 feet on uneven surface  ?activity ? ? ?Assist Walk 10 feet on uneven surfaces activity did not occur: Safety/medical concerns ? ? ?  ?   ? ?Wheelchair ? ? ? ? ?Assist Is the patient using a wheelchair?: Yes ?Type of Wheelchair: Manual ?Wheelchair activity did not occur: Safety/medical concerns ? ?  ?   ? ? ?Wheelchair 50 feet with 2 turns activity ? ? ? ?Assist ? ?  ?Wheelchair 50 feet with 2 turns activity did not occur: Safety/medical concerns ? ? ?   ? ?Wheelchair 150 feet activity  ? ? ? ?Assist ? Wheelchair 150 feet activity did not occur: Safety/medical concerns ? ? ?   ? ?Blood pressure (!) 125/58, pulse (!) 53, temperature (!) 97.3 ?F (36.3 ?C), temperature source Oral, resp. rate 18, height 6\' 1"  (1.854 m), weight 89 kg, SpO2 94 %. ? ? Medical Problem List and Plan: ?1. Functional deficits secondary to right MCA infarct likely embolic d/t  atrial fib ?            -patient may  shower ?            -ELOS/Goals: 14-20 days, min assist with PT, OT, SLP ? Continue CIR- PT, OT and SLP ?2.  Antithrombotics: ?-DVT/anticoagulation:  Pharmaceutical: Other (comment)--Eliquis started on 04/04. Dopplers clean ?            -antiplatelet therapy: N/A ?3. Left hip pain/Pain Management:   ?-xr without fx,?mild HO--> focus on ROM, spasticity control ?--Tylenol prn. Voltaren  gel for local measures.  ?-add kpad ?4/11- kapd ordered again; add tramadol 50 mg q6 hours for pain; spasticity as below ?4. Mood: LCSW to follow for evaluation and support.  ?            -antipsychotic agents: N/A ?5. Neuropsych: This patient maybe intermittently capable of making decisions on his own behalf. ?6. Skin/Wound Care: Routine pressure relief measures.  ? -hydrocortisone for rash on back ?7. Fluids/Electrolytes/Nutrition: encourage PO..   ?   ?8. A fib: Monitor HR TID-HR in 90's ?--monitor for symptoms with activity.  ?4/11- HR fluctuating under 100-  ?9. Dysphagia: Continue D2, nectar liquids with supervision for safety ?-  encourage fluids, labs ok ?10. Hyperlipidemia: LDL- continue Crestor 20 mg  ?11. Constipation:  continue Senna S  BID ?             4/7 multiple bm's after sorbitol,etc ? 4/11- stop Senna since pt's stools are pasty/frequent/incontinent ?12. Acute blood loss anemia: Hbg down from 14.4-->12.2 ?--Monitor for signs of bleeding. ?            --hgb up to 13.8 4/5  ? 4/10- Hb 13.4-  ?13. H/o Cervical DDD w/cord compression:  s/p ACDF with residual numbness of bilateral hands ?14. Lumbar stenosis: S/p decompression Jan 2023. ?4/11- will do Xrays of lumbar spine since pt had fall and is worried hurt hardware.   ?15. Impaired fasting glucose: Hgb A1c-5.4.  ?16. Transaminitis: mild elevation 4/5 ? -likely reactive. F/u Monday ? 4/10- LFTs back to baseline-  ?17. Spastic left hemiparesis ? -resting orthotics ordered, positioning/stretching by therapy ? --continue tizanidine to 2mg  TID ? -likely botox candidate as outpt ? -discussed that baclofen will also help with spasticity ? Continue Dantrolene- 50 mg QHS- think could help the spasticity- will also start Tramadol 50 mg q6 hours since so painful and on nothing except tylenol.  ?18. Hiccups: continue baclofen, helping ?19. Daytime somnolence: change tizanidine to 4mg  HS ?20. Bradycardia: continue to monitor TID ? ? ? ?  ? ?LOS: ?9 days ?A FACE  TO FACE EVALUATION WAS PERFORMED ? ? P Kalief Kattner ?12/17/2021, 12:45 PM  ? ?  ?

## 2021-12-17 NOTE — Plan of Care (Signed)
?  Problem: RH BOWEL ELIMINATION ?Goal: RH STG MANAGE BOWEL WITH ASSISTANCE ?Description: STG Manage Bowel with [place]. ?Outcome: Progressing ?Goal: RH STG MANAGE BOWEL W/MEDICATION W/ASSISTANCE ?Description: STG Manage Bowel with Medication with [place]. ?Outcome: Progressing ?  ?Problem: RH BLADDER ELIMINATION ?Goal: RH STG MANAGE BLADDER WITH ASSISTANCE ?Description: STG Manage Bladder With Min Assistance ?Outcome: Progressing ?Goal: RH STG MANAGE BLADDER WITH MEDICATION WITH ASSISTANCE ?Description: STG Manage Bladder With Medication With [place]. ?Outcome: Progressing ?  ?Problem: RH SKIN INTEGRITY ?Goal: RH STG MAINTAIN SKIN INTEGRITY WITH ASSISTANCE ?Description: STG Maintain Skin Integrity With [employer]. ?Outcome: Progressing ?Goal: RH STG ABLE TO PERFORM INCISION/WOUND CARE W/ASSISTANCE ?Description: STG Able To Perform Incision/Wound Care With [facility]. ?Outcome: Progressing ?  ?Problem: RH SAFETY ?Goal: RH STG ADHERE TO SAFETY PRECAUTIONS W/ASSISTANCE/DEVICE ?Description: STG Adhere to Safety Precautions With Cues and Reminders. ?Outcome: Progressing ?  ?Problem: RH COGNITION-NURSING ?Goal: RH STG ANTICIPATES NEEDS/CALLS FOR ASSIST W/ASSIST/CUES ?Description: STG Anticipates Needs/Calls for Assist With Cues and Reminders. ?Outcome: Progressing ?  ?Problem: RH KNOWLEDGE DEFICIT ?Goal: RH STG INCREASE KNOWLEDGE OF STROKE PROPHYLAXIS ?Description: Patient will demonstrate knowledge of medications used to prevent future stokes with educational materials and handouts provided by staff independently at discharge. ?Outcome: Progressing ?  ?Problem: Consults ?Goal: RH STROKE PATIENT EDUCATION ?Description: See Patient Education module for education specifics  ?Outcome: Progressing ?  ?

## 2021-12-17 NOTE — Progress Notes (Signed)
Occupational Therapy Session Note ? ?Patient Details  ?Name: Patrick Rose ?MRN: [id number] ?Date of Birth: [birth year] ? ?Today's Date: 12/17/2021 ?OT Individual Time: 4166-0630 ?OT Individual Time Calculation (min): 29 min  ? ? ?Short Term Goals: ?Week 1:  OT Short Term Goal 1 (Week 1): Pt will maintain static sitting balance with max cueing and mod A ?OT Short Term Goal 1 - Progress (Week 1): Met ?OT Short Term Goal 2 (Week 1): Pt will don a shirt with mod A ?OT Short Term Goal 2 - Progress (Week 1): Progressing toward goal ?OT Short Term Goal 3 (Week 1): Pt will complete a sit <> stand with LRAD with max A of 1 ?OT Short Term Goal 3 - Progress (Week 1): Progressing toward goal ?OT Short Term Goal 4 (Week 1): Pt will demo improved sustained attention, requiring only mod cueing to attend to one grooming tasks ?OT Short Term Goal 4 - Progress (Week 1): Progressing toward goal ?Week 2:  OT Short Term Goal 1 (Week 2): Pt will sit EOB with no more than mod A with max cueing for focused attention ?OT Short Term Goal 2 (Week 2): Pt will don a shirt with max A ?OT Short Term Goal 3 (Week 2): Pt will demonstrated sustained attention to UB dressing or bathing task with max cueing ?OT Short Term Goal 4 (Week 2): [name] caregiver will demonstrate competency with LUE PROM HEP ? ?Skilled Therapeutic Interventions/Progress Updates:  ?  Pt received in TIS, no c/o pain, agreeable to therapy. Session focus on activity tolerance, LUE NMR/attention in prep for improved ADL/IADL/func mobility performance + decreased caregiver burden. ? ?R gaze preference and head tilt noted. Intermittent cuing throughout session to scan L to talk to other staff in halls, look forward to look where we were going in [place]. Total A TIS transport to and from gym.  ? ?With ace wrap to L grip, pt completed 1x10 of the following with 1 lb dowel rod: forward/backward circles, B shoulder flexion, chest press. Attempted to have pt roll dowel rod forward and back  across table, but pt with significant difficulty carrying out / motor planning therex due to internal distractibility despite max hand-over hand assist.  ? ? ?Therapist applied [place]o Stim One NMES to the L shoulder / anterior/posterior deltoid for treatment of subluxation set at the below settings:   ?  ?Sa[place]o Stim One ?Intensity:5 clicks ?Duration: 60 min ?330 pulse width ?35 Hz pulse rate ?On 8 sec/ off 8 sec ?Ramp up/ down 2 sec ?Symmetrical Biphasic wave form ?Max intensity 159m at 500 Ohm load ? ?No c/o pain post application, pt reports enjoying use of estim. ? ?Issued soft sponge to facilitate L grip/NMR. ? ?Pt left tilted back in TIS with safety belt alarm engaged, call bell in reach, and all immediate needs met.  ? ? ?Therapy Documentation ?Precautions:  ?Precautions ?Precautions: Fall ?Precaution Comments: dense L hemipareisis with UE/LE tone, pushes to L, limited focus ?Restrictions ?Weight Bearing Restrictions: No ? ? ?Pain: no c/o throughout ?  ?ADL: See Care Tool for more details. ?   ? ? ?Therapy/Group: Individual Therapy ? ?RVolanda NapoleonMS, OTR/L ? ?12/17/2021, 6:51 AM ?

## 2021-12-18 DIAGNOSIS — I63511 Cerebral infarction due to unspecified occlusion or stenosis of right middle cerebral artery: Secondary | ICD-10-CM | POA: Diagnosis not present

## 2021-12-18 MED ORDER — BACLOFEN 5 MG HALF TABLET
5.0000 mg | ORAL_TABLET | Freq: Three times a day (TID) | ORAL | Status: DC
  Administered 2021-12-18 – 2021-12-28 (×29): 5 mg via ORAL
  Filled 2021-12-18 (×29): qty 1

## 2021-12-18 NOTE — Progress Notes (Signed)
Physical Therapy Session Note ? ?Patient Details  ?Name: Patrick Rose ?MRN: [id number] ?Date of Birth: [birth year] ? ?Today's Date: 12/18/2021 ?PT Individual Time: 9390-3009 and 1417-1530 ?PT Individual Time Calculation (min): 44 min and 73 min. ? ?Short Term Goals: ?Week 2:  PT Short Term Goal 1 (Week 2): Pt will perform bed mobility with consistent MaxA and imrproved ability to follow instructions. ?PT Short Term Goal 2 (Week 2): Pt will perform sit<>stand with Max A using LRAD. ?PT Short Term Goal 3 (Week 2): Patient will perform sitting balance with supervision >2 min with mod cues for midline orientation. ?PT Short Term Goal 4 (Week 2): Patient will perform chair<>chair transfers with mod A +2. ? ?Skilled Therapeutic Interventions/Progress Updates:  ? ?First session:  Pt presents sitting in TIS tilted back.  Pt agreeable to therapy.  Pt sitting flexed to left and required partial sit to stand for improved posture.  Pt wheeled to small gym for use of standing frame.  A of 3 for placement of sling.  Pt raised mechanically, but verbal cues for pushing up w/ LEs and forward lean.  Ptrequired max A x 2 -3 to achieve upright stance/posture and then able to maintain w/ A of 1 for short periods.  Pt reaching for objects above PT head and to right to engage R hand and reduce pushing.  PT on left attempting to stretch elbow flexors for hand placement on tray and for WB through elbow/shoulder.  Pt states fatigue and lowered to w/c.  Pt performing C/spine ROM to increase L rotation.  Pt returned to standing for continued WB and midling standing w/ frquent to constant verbal cues .   Pt c/o dizziness, BP measured at 154/109 w/ 2nd reading of 143/107.  Nursing notified on return to room and will assess and administer meds.  Chair alarm on and TIS tilted w/ al needs in reach. ? ?Second session:  Pt presents reclined in TIS and agreeable to therapy.  Pt wheeled to dayroom for time conservation.  During trip to room, cued for  visual scanning to left.  Pt required A +2 w/ 3rd person to hold SB for transfer to mat table.  Pt cued to reach to right to assist w/ SB placement.  Pt required A of 2 for SB transfer w/ verbal cues for forward lean "put your forehead on my L shoulder".  Pt performed seated balance activities w/ use of mirror, but pt very distractible.  PT stretching L elbow and hand for WB to L.  Pt cued to touch R shoulder to Tech shoulder, and able to maintain balance x 10 seconds before distracted.  Pt performed trunk strengthening leaning back on T-ball and back to midline, but overshoots and lists to L 80% of time.  Pt strong pusher to left and Tech maintaining control of R hand to discourage.  Pt reaching forward and down to right for sandbags w/ cues to sit upright before handing to PT on left.  Pt required A + 2 for SB to right.  Pt performed SB transfer w/ A + 2 to left w/c > bed.  Pt required max A sit to supine using reverse log roll .  Bed alarm on and all needs in reach.   ?   ? ?Therapy Documentation ?Precautions:  ?Precautions ?Precautions: Fall ?Precaution Comments: dense L hemipareisis with UE/LE tone, pushes to L, limited focus ?Restrictions ?Weight Bearing Restrictions: No ?General: ?  ?Vital Signs: ?  ?Pain:no c/o ?Pain Assessment ?Pain  Score: 2  ?Pain Location: Hip ?Pain Orientation: Left ?Pain Descriptors / Indicators: Aching;Tender;Discomfort;Restless ?Pain Onset: On-going ?Patients Stated Pain Goal: 2 ?Pain Intervention(s): Medication (See eMAR);Cold applied;Relaxation;Pain med given for lower pain score than stated, per patient request;Repositioned ?Mobility: ?  ? ? ? ? ?Therapy/Group: Individual Therapy ? ?Patrick Rose ?12/18/2021, 12:22 PM  ?

## 2021-12-18 NOTE — Progress Notes (Signed)
?                                                       PROGRESS NOTE ? ? ?Subjective/Complaints: ?No new complaints this morning ?Spasticity improved ?Less sleepy last night with change in Tizanidine to HS 4mg  ?Provided with list of foods to help with pain ? ?ROS:  ?Pt denies SOB, abd pain, CP, N/V/C/ (+)D, and vision changes, +daytime somnolence, hiccups improved, +left hip spasticity ? ?Objective: ?  ?No results found. ?No results for input(s): WBC, HGB, HCT, PLT in the last 72 hours. ? ? ?No results for input(s): NA, K, CL, CO2, GLUCOSE, BUN, CREATININE, CALCIUM in the last 72 hours. ? ? ? ?Intake/Output Summary (Last 24 hours) at 12/18/2021 1322 ?Last data filed at 12/18/2021 0930 ?Gross per 24 hour  ?Intake 358 ml  ?Output 400 ml  ?Net -42 ml  ?  ? ?  ? ?Physical Exam: ?Vital Signs ?Blood pressure 135/84, pulse 91, temperature 98.3 ?F (36.8 ?C), temperature source Oral, resp. rate 18, height 6\' 1"  (1.854 m), weight 89 kg, SpO2 94 %. ? ?Gen: no distress, normal appearing ?HEENT: oral mucosa pink and moist, NCAT ?Cardio: Bradycardic ?Chest: normal effort, normal rate of breathing ?Abd: soft, non-distended ?Ext: no edema ?Psychiatric: appropriate- impulsive; anxious ?Neurological: spasticity improved in left hip ? ?Skin: maculopapular rash along back. Multiple bruises Right thigh upper exts. R hip/groin purple from bruising  ?Neuro:  Pt is alert to person, place, reason. Follows simple commands. Left central 7 and left hemianopsia. Dysarthric. LUE and LLE 0/5 with significant flexor tone LUE  1/4, and flex/ext tone LLE 2-3/4. Left SCM also tight 2/4.  DTR's 3+ on left ?Musculoskeletal: mild left hip pain, no limb pain with palpation  ? ? ?Assessment/Plan: ?1. Functional deficits which require 3+ hours per day of interdisciplinary therapy in a comprehensive inpatient rehab setting. ?Physiatrist is providing close team supervision and 24 hour management of active medical problems listed below. ?Physiatrist and rehab  team continue to assess barriers to discharge/monitor patient progress toward functional and medical goals ? ?Care Tool: ? ?Bathing ? Bathing activity did not occur: Safety/medical concerns ?Body parts bathed by patient: Chest, Abdomen, Front perineal area, Right upper leg, Left upper leg, Face, Left arm  ? Body parts bathed by helper: Right arm, Buttocks, Right lower leg, Left lower leg ?  ?  ?Bathing assist Assist Level: Maximal Assistance - Patient 24 - 49% ?  ?  ?Upper Body Dressing/Undressing ?Upper body dressing Upper body dressing/undressing activity did not occur (including orthotics): Safety/medical concerns ?What is the patient wearing?: Pull over shirt ?   ?Upper body assist Assist Level: 2 Helpers ?   ?Lower Body Dressing/Undressing ?Lower body dressing ? ? ?   ?What is the patient wearing?: Pants ? ?  ? ?Lower body assist Assist for lower body dressing: 2 Helpers ?   ? ?Toileting ?Toileting    ?Toileting assist Assist for toileting: 2 Helpers ?  ?  ?Transfers ?Chair/bed transfer ? ?Transfers assist ? Chair/bed transfer activity did not occur: Safety/medical concerns ? ?Chair/bed transfer assist level: 2 Helpers ?  ?  ?Locomotion ?Ambulation ? ? ?Ambulation assist ? ? Ambulation activity did not occur: Safety/medical concerns ? ?  ?  ?   ? ?Walk 10 feet activity ? ? ?Assist ? Walk  10 feet activity did not occur: Safety/medical concerns ? ?  ?   ? ?Walk 50 feet activity ? ? ?Assist Walk 50 feet with 2 turns activity did not occur: Safety/medical concerns ? ?  ?   ? ? ?Walk 150 feet activity ? ? ?Assist Walk 150 feet activity did not occur: Safety/medical concerns ? ?  ?  ?  ? ?Walk 10 feet on uneven surface  ?activity ? ? ?Assist Walk 10 feet on uneven surfaces activity did not occur: Safety/medical concerns ? ? ?  ?   ? ?Wheelchair ? ? ? ? ?Assist Is the patient using a wheelchair?: Yes ?Type of Wheelchair: Manual ?Wheelchair activity did not occur: Safety/medical concerns ? ?  ?   ? ? ?Wheelchair 50  feet with 2 turns activity ? ? ? ?Assist ? ?  ?Wheelchair 50 feet with 2 turns activity did not occur: Safety/medical concerns ? ? ?   ? ?Wheelchair 150 feet activity  ? ? ? ?Assist ? Wheelchair 150 feet activity did not occur: Safety/medical concerns ? ? ?   ? ?Blood pressure 135/84, pulse 91, temperature 98.3 ?F (36.8 ?C), temperature source Oral, resp. rate 18, height 6\' 1"  (1.854 m), weight 89 kg, SpO2 94 %. ? ? Medical Problem List and Plan: ?1. Functional deficits secondary to right MCA infarct likely embolic d/t  atrial fib ?            -patient may  shower ?            -ELOS/Goals: 14-20 days, min assist with PT, OT, SLP ? Continue CIR- PT, OT and SLP ?2.  Antithrombotics: ?-DVT/anticoagulation:  Pharmaceutical: Other (comment)--Eliquis started on 04/04. Dopplers clean ?            -antiplatelet therapy: N/A ?3. Left hip pain: provided list of foods to help with pain ?-xr without fx,?mild HO--> focus on ROM, spasticity control ?--Tylenol prn. Voltaren gel for local measures.  ?-add kpad ?add tramadol 50 mg q6 hours for pain ?4. Mood: LCSW to follow for evaluation and support.  ?            -antipsychotic agents: N/A ?5. Neuropsych: This patient maybe intermittently capable of making decisions on his own behalf. ?6. Skin/Wound Care: Routine pressure relief measures.  ? -hydrocortisone for rash on back ?7. Fluids/Electrolytes/Nutrition: encourage PO..   ?   ?8. A fib: Monitor HR TID-HR in 90's ?--monitor for symptoms with activity.  ?4/11- HR fluctuating under 100-  ?9. Dysphagia: Continue D2, nectar liquids with supervision for safety ?-encourage fluids, labs ok ?10. Hyperlipidemia: LDL- continue Crestor 20 mg  ?11. Constipation:  continue Senna S  BID ?             4/7 multiple bm's after sorbitol,etc ? 4/11- stop Senna since pt's stools are pasty/frequent/incontinent ?12. Acute blood loss anemia: Hbg down from 14.4-->12.2 ?--Monitor for signs of bleeding. ?            --hgb up to 13.8 4/5  ? 4/10- Hb 13.4-   ?13. H/o Cervical DDD w/cord compression:  s/p ACDF with residual numbness of bilateral hands ?14. Lumbar stenosis: S/p decompression Jan 2023. ?4/11- will do Xrays of lumbar spine since pt had fall and is worried hurt hardware.   ?15. Impaired fasting glucose: Hgb A1c-5.4.  ?16. Transaminitis: mild elevation 4/5 ? -likely reactive. F/u Monday ? 4/10- LFTs back to baseline-  ?17. Spastic left hemiparesis ? -resting orthotics ordered, positioning/stretching by therapy ? --improving,  change tizanidine to 4mg  HS ? -likely botox candidate as outpt ? -discussed that baclofen will also help with spasticity ? Continue Dantrolene- 50 mg QHS- think could help the spasticity- will also start Tramadol 50 mg q6 hours since so painful and on nothing except tylenol.  ?18. Hiccups: continue baclofen, helping ?19. Daytime somnolence: change tizanidine to 4mg  HS, improved, decrease baclofen to 5mg  TID ?20. Bradycardia: continue to monitor TID ? ? ? ?  ? ?LOS: ?10 days ?A FACE TO FACE EVALUATION WAS PERFORMED ? ?Martha Clan P Oval Cavazos ?12/18/2021, 1:22 PM  ? ?  ?

## 2021-12-18 NOTE — Progress Notes (Addendum)
Occupational Therapy Session Note ? ?Patient Details  ?Name: Patrick Rose ?MRN: [id number] ?Date of Birth: Sep 18, 1950 ? ?Today's Date: 12/18/2021 ?OT Individual Time: 1010-1100 ?OT Individual Time Calculation (min): 50 min  ? ?Session 2 ?OT Individual Time: 8675-4492 ?OT Individual Time Calculation (min): 25 min  ? ? ?Short Term Goals: ?Week 2:  OT Short Term Goal 1 (Week 2): Pt will sit EOB with no more than mod A with max cueing for focused attention ?OT Short Term Goal 2 (Week 2): Pt will don a shirt with max A ?OT Short Term Goal 3 (Week 2): Pt will demonstrated sustained attention to UB dressing or bathing task with max cueing ?OT Short Term Goal 4 (Week 2): [name] caregiver will demonstrate competency with LUE PROM HEP ? ?Skilled Therapeutic Interventions/Progress Updates:  ?  Pt received supine with no c/o pain, agreeable to OT session.  Initiated peri care first, with pt demonstrating improved tone management in his L hip with less pain overall reported this session. He completed rolling to the R with max A and min A to the L. He required max A for peri hygiene and brief change. He was able to bridge slightly with max A at the LLE to remain flexed on the bed and max A at his hips. He required constant max cueing for attention to task. He completed bed mobility to EOB with max A. He was able to maintain static sitting balance with the RUE on the bottom bed rail- good improvement! He completed a slideboard transfer to the w/c toward the R with max A +2 but improved LE activation and trunk positioning. He donned a shirt with mod A. He was left sitting up with all needs met in the TIS- chair alarm set. OT returned after PT session to apply saebo as detailed below.  ? ?Saebo Stim One applied to pt's L tricep to assist with muscle activation and promote visual attention to L UE. No c/o pain or adverse skin reaction. 60 min unattended.  ?330 pulse width ?35 Hz pulse rate ?On 8 sec/ off 8 sec ?Ramp up/ down 2  sec ?Symmetrical Biphasic wave form  ?Max intensity 165m at 500 Ohm load ? ? ?Session 2 ? ?Pt received in the TIS w/c with no c/o pain, his wife present. Session focused on instruction/education for his wife BJacqlyn Larsento provide PROM to his LUE. Provided a hand out with pictures to ensure carryover and provided demonstration and teach back of proper/safe ways to provide PROM. Becky also gave OT a copy of their dog's medical/vaccination records so that she could bring him in to visit [place]. Made a copy and placed in pt's chart. Pt was left sitting up with all needs met, chair alarm set.  ? ? ?Therapy Documentation ?Precautions:  ?Precautions ?Precautions: Fall ?Precaution Comments: dense L hemipareisis with UE/LE tone, pushes to L, limited focus ?Restrictions ?Weight Bearing Restrictions: No ? ?Therapy/Group: Individual Therapy ? ?SCurtis Sites?12/18/2021, 6:18 AM ?

## 2021-12-19 DIAGNOSIS — I6601 Occlusion and stenosis of right middle cerebral artery: Secondary | ICD-10-CM | POA: Diagnosis not present

## 2021-12-19 NOTE — Progress Notes (Signed)
Speech Language Pathology Daily Session Note ? ?Patient Details  ?Name: Patrick Rose ?MRN: [id number] ?Date of Birth: 07-04-1951 ? ?Today's Date: 12/19/2021 ?SLP Individual Time: 0905-1000 ?SLP Individual Time Calculation (min): 55 min ? ?Short Term Goals: ?Week 2: SLP Short Term Goal 1 (Week 2): Pt will consume current diet Dys 2 textures and Nectar thick liquids with mod A verbal cues to correct left spillage and pocketing. ?SLP Short Term Goal 2 (Week 2): Pt will consume thin liquid trials via cup with overt s/s of aspiration in less than 25% of trials with Mod verbal cues over 2 sessions prior to MBS. ?SLP Short Term Goal 3 (Week 2): Pt will demonstrate use of speech intelligibility strategies at the sentence level producing 80% intelligibility with min A verbal cues. ?SLP Short Term Goal 4 (Week 2): Patient will demonstrate sustained attention to functional tasks for 3 minutes with Max A verbal cues. ?SLP Short Term Goal 5 (Week 2): Patient will scan/attend to left field of enviornment during functional tasks with Mod verbal cues in 50% of opportunities. ?SLP Short Term Goal 6 (Week 2): Patient will demonstrate functional problem solving for basic and familiar tasks with Mod verbal cues. ? ?Skilled Therapeutic Interventions: ?Pt seen for skilled ST with focus on cognitive and swallow goals, pt in bed asleep upon entry. Pt rouses to voice and benefits from occasional verbal cues to maintain alertness during session. SLP noting mild oral stasis and L pocketing from AM meal, pt able to clear effective with liquid assistance/oral care.  Pt able to recall compensatory swallow strategies with Supervision A cues, says he tries to avoid "chipmunk cheeks" with lingual/finger sweep. SLP facilitating sustained attention and error awareness during card sorting activity by providing overall min A verbal and visual cues. Pt benefits from visual anchor to fully scan to L side. Pt completing functional problem solving for use  of cell phone benefiting from mod-max A at this time. Pt left in bed with spouse present and all needs met, cont ST POC. ? ?Pain ?Pain Assessment ?Pain Scale: 0-10 ?Pain Score: 0-No pain ? ?Therapy/Group: Individual Therapy ? ?Dewaine Conger ?12/19/2021, 10:04 AM ?

## 2021-12-19 NOTE — Progress Notes (Signed)
?                                                       PROGRESS NOTE ? ? ?Subjective/Complaints: ?No major complaints overnight.  Patient about to eat breakfast. Reports improved spacticity and is less sleepy now with a change in Tizanidine to HS 4 mg.  However, he does report some daytime sleepiness still.  Continue with list of foods to help with help. ? ?ROS: Patient denies abd pain, shortness of breath, V/V/C/+ diarrhea, +daytime somnolence, +hiccups, but greatly improved, +left hip spasticity. ? ? ?Objective: ?  ?No results found. ?No results for input(s): WBC, HGB, HCT, PLT in the last 72 hours. ?No results for input(s): NA, K, CL, CO2, GLUCOSE, BUN, CREATININE, CALCIUM in the last 72 hours. ? ?Intake/Output Summary (Last 24 hours) at 12/19/2021 0629 ?Last data filed at 12/18/2021 1800 ?Gross per 24 hour  ?Intake 358 ml  ?Output --  ?Net 358 ml  ?  ? ?  ? ?Physical Exam: ? ?Constitutional: No distress . Vital signs reviewed.Normal appearing ?HENT: Normocephalic.  Atraumatic.  Oral mucosa pink and moist.  NCAT. ?Eyes: EOMI. No discharge. ?Cardiovascular: RRR. No JVD. ?Respiratory: CTA Bilaterally. Normal effort. ?GI: BS +. Non-distended. ?Ext: No edema. ?Musc: No edema or tenderness in extremities. ?Psychiatric: Appropriate affect impulsive, anxious. ?Skin: Maculopapular rash along back present but improved.  Has multiple bruises on right thigh upper extremities, right hip groin is purple some bruising. ?Neurological: Patient is alert to person place and time and situation.  Can follow simple commands left central 7 and left hemianopsia.  Dysarthric. Motor: LLU/LLE 0/5 with significant flexor tone, LUE 1/4, flex/ext tone LUE 1/4, LLE 1/4, flex/ext tone LLE 2-3/4.  Left sternocleidomastoid 2/4.  DTR's 3+ (left side). Spasticity improved in left hip. ?Musculoskeletal: Mild left hip pain, no pain with palpation. ? ?Vital Signs ?Blood pressure (!) 145/76, pulse 98, temperature 98 ?F (36.7 ?C), temperature source  Oral, resp. rate 18, height 6\' 1"  (1.854 m), weight 89 kg, SpO2 94 %. ? ? ? ?Assessment/Plan: ?1. Functional deficits which require 3+ hours per day of interdisciplinary therapy in a comprehensive inpatient rehab setting. ?Physiatrist is providing close team supervision and 24 hour management of active medical problems listed below. ?Physiatrist and rehab team continue to assess barriers to discharge/monitor patient progress toward functional and medical goals ? ?Care Tool: ? ?Bathing ? Bathing activity did not occur: Safety/medical concerns ?Body parts bathed by patient: Chest, Abdomen, Front perineal area, Right upper leg, Left upper leg, Face, Left arm  ? Body parts bathed by helper: Right arm, Buttocks, Right lower leg, Left lower leg ?  ?  ?Bathing assist Assist Level: Maximal Assistance - Patient 24 - 49% ?  ?  ?Upper Body Dressing/Undressing ?Upper body dressing Upper body dressing/undressing activity did not occur (including orthotics): Safety/medical concerns ?What is the patient wearing?: Pull over shirt ?   ?Upper body assist Assist Level: 2 Helpers ?   ?Lower Body Dressing/Undressing ?Lower body dressing ? ? ?   ?What is the patient wearing?: Pants ? ?  ? ?Lower body assist Assist for lower body dressing: 2 Helpers ?   ? ?Toileting ?Toileting    ?Toileting assist Assist for toileting: 2 Helpers ?  ?  ?Transfers ?Chair/bed transfer ? ?Transfers assist ? Chair/bed transfer activity did  not occur: Safety/medical concerns ? ?Chair/bed transfer assist level: 2 Helpers (Slide board.) ?  ?  ?Locomotion ?Ambulation ? ? ?Ambulation assist ? ? Ambulation activity did not occur: Safety/medical concerns ? ?  ?  ?   ? ?Walk 10 feet activity ? ? ?Assist ? Walk 10 feet activity did not occur: Safety/medical concerns ? ?  ?   ? ?Walk 50 feet activity ? ? ?Assist Walk 50 feet with 2 turns activity did not occur: Safety/medical concerns ? ?  ?   ? ? ?Walk 150 feet activity ? ? ?Assist Walk 150 feet activity did not occur:  Safety/medical concerns ? ?  ?  ?  ? ?Walk 10 feet on uneven surface  ?activity ? ? ?Assist Walk 10 feet on uneven surfaces activity did not occur: Safety/medical concerns ? ? ?  ?   ? ?Wheelchair ? ? ? ? ?Assist Is the patient using a wheelchair?: Yes ?Type of Wheelchair: Manual ?Wheelchair activity did not occur: Safety/medical concerns ? ?  ?   ? ? ?Wheelchair 50 feet with 2 turns activity ? ? ? ?Assist ? ?  ?Wheelchair 50 feet with 2 turns activity did not occur: Safety/medical concerns ? ? ?   ? ?Wheelchair 150 feet activity  ? ? ? ?Assist ? Wheelchair 150 feet activity did not occur: Safety/medical concerns ? ? ?   ? ?Blood pressure (!) 145/76, pulse 98, temperature 98 ?F (36.7 ?C), temperature source Oral, resp. rate 18, height 6\' 1"  (1.854 m), weight 89 kg, SpO2 94 %. ? ?Medical Problem List and Plan: ?1. Functional deficits secondary to right MCA infarct likely embolic d/t atrial fib. ?-Patient may shower. ? -ELOS/Goals: 14-20 days,  min assist with PT, OT, SLP. ?-Continue CIR: PT, OT, SLP. ?2.  Antithrombotics: ?-DVT/anticoagulation:  Eliquis started on 12/08/21.  Dopplers show no blockage. ?            -antiplatelet therapy:N/A ?-4/15 Continue Eliquis 5 mg daily. ?3. Pain Management for left hip pain: Continue encouraging use of foods from list that aid in pain management. ?-XR without evidence of fracture. ?-Continue focus on spasticity control and ROM. ?-Continue K pad use ?-Continue Tylenol prn and Voltaren gel as needed. ?4. Mood: LCSW did follow for evaluation and support.   ?-Antipsychotic agents: N/A ?5. Neuropsych: Patient may be intermittently capable of making decisions on his own behalf. ?6. Skin/Wound Care: Routine skin checks ?Hydrocortisone for rash on back.  Maculopapular rash is improving. ?7. Fluids/Electrolytes/Nutrition: Routine in and outs with follow-up chemistries ?Encourage p.o. intake. ?8. A-fib: Monitor heart rate 3 times daily heart rate continues to remain in the 90s. ?-Monitor  for symptoms with activity. ?9. Dysphagia: Continue D2 nectar liquids with supervision for safety. ?-Encourage fluids Labs continue to be within normal limits. ?10. Hyperlipidemia for HDL continue Crestor 20 mg. ?11.  Constipation: Continue senna twice daily. ?-4/7 multiple BMs after sorbitol. ?-4/11.  Stop Senna since patient's stools are pasty and frequent and he is incontinent -4/15 Pt reports normal BM today. ?12.acute blood loss anemia: Hemoglobin down from 14.4-12.2. ?- Monitor for signs of bleeding. ?- 4/5 Hgb up to 13.8 ?--4/10 Hemoglobin 13.4 ?--Continue to trend Hgb with next CBC on Monday 12/21/21. ?13.  History of cervical DDD with cord compression: S\P ACDF with residual numbness in bilateral hands. ?14.  Lumbar stenosis: S\P decompression January 2023. ?- 4/11 X-rays of lumbar spine was order because patient had a recent fall and is worried about her hardware. ?- No acute osseous  abnormality identified.  Interval L4-5 fusion. ?15.  Impaired Fasting glucose: Hemoglobin A1c-5.4. ?-4/15 CBG's are trending in the 120's-130's ?-Not currently on medication. ?-Continue to trend and monitor. ?16.  Transaminitis: Mild elevation on 4 5. ?Likely reactive with further follow-up. ?410 LFTs have returned to baseline. ?17.  Spastic left hemiparesis ?- Resting orthotic in place positioning and stretching per therapy ?- Improving, tizanidine continue at 4 mg at bedtime ?- Likely Botox candidate as an outpatient ?- Discussed baclofen can also help with spasticity. ?- Continue 50 mg nightly dantrolene to help with spasticity. ?-Continue tramadol 50 mg every 6 hours. ?18.  Hiccups continue baclofen helping some. ?19.  Daytime some somnolence: Continue tizanidine 4 mg at bedtime continue baclofen dose 5 mg 3 times daily. ?20. bradycardia: Continue to monitor 3 times daily currently appears resolved. ? ? ?  ?LOS: ?11 days ?A FACE TO FACE EVALUATION WAS PERFORMED ? ?Luetta Nutting, FNP ?12/19/2021, 6:29 AM  ? ? ? ?

## 2021-12-20 DIAGNOSIS — I6601 Occlusion and stenosis of right middle cerebral artery: Secondary | ICD-10-CM | POA: Diagnosis not present

## 2021-12-20 NOTE — Progress Notes (Signed)
Speech Language Pathology Daily Session Note ? ?Patient Details  ?Name: Patrick Rose ?MRN: [id number] ?Date of Birth: 1950-10-01 ? ?Today's Date: 12/20/2021 ?SLP Individual Time: [id number] ?SLP Individual Time Calculation (min): 45 min ? ?Short Term Goals: ?Week 2: SLP Short Term Goal 1 (Week 2): Pt will consume current diet Dys 2 textures and Nectar thick liquids with mod A verbal cues to correct left spillage and pocketing. ?SLP Short Term Goal 2 (Week 2): Pt will consume thin liquid trials via cup with overt s/s of aspiration in less than 25% of trials with Mod verbal cues over 2 sessions prior to MBS. ?SLP Short Term Goal 3 (Week 2): Pt will demonstrate use of speech intelligibility strategies at the sentence level producing 80% intelligibility with min A verbal cues. ?SLP Short Term Goal 4 (Week 2): Patient will demonstrate sustained attention to functional tasks for 3 minutes with Max A verbal cues. ?SLP Short Term Goal 5 (Week 2): Patient will scan/attend to left field of enviornment during functional tasks with Mod verbal cues in 50% of opportunities. ?SLP Short Term Goal 6 (Week 2): Patient will demonstrate functional problem solving for basic and familiar tasks with Mod verbal cues. ? ?Skilled Therapeutic Interventions: ?  Skilled SLP intervention focused on dysphagia and cognition. Oral care completed prior to beginning trials. Pt seen with dys 2 snack NTL and trials with thin liquids. He required occasional cue to use tongue to sweep food from left cheek or take sip of NTL to clear residue. He consumed 4 oz of thin liquid via cup sip. Voice was clear after all trials. He toelrated dys 2 snack ntl and thin liquids with no overt s/sx of aspiration or penetration. Pt located items in left visual field with mod-max A to use head turn strategy for items located in lower left field . Cont with therapy per plan of care.   ? ?Pain ?Pain Assessment ?Pain Scale: Faces ?Faces Pain Scale: No hurt ? ?Therapy/Group:  Individual Therapy ? ?Gregary Signs A Audel Coakley ?12/20/2021, 2:24 PM ?

## 2021-12-20 NOTE — Progress Notes (Signed)
?                                                       PROGRESS NOTE ? ? ?Subjective/Complaints: ?No major complaints overnight.  Patient is eating breakfast. No complaints today of daytime sleepiness, appears alert. ? ? ?ROS: Patient denies abd pain, shortness of breath, N/V/C/+ diarrhea, +daytime somnolence, +hiccups, improved, +left hip spasticity, but improved. ? ? ?Objective: ?  ?No results found. ?No results for input(s): WBC, HGB, HCT, PLT in the last 72 hours. ?No results for input(s): NA, K, CL, CO2, GLUCOSE, BUN, CREATININE, CALCIUM in the last 72 hours. ? ?Intake/Output Summary (Last 24 hours) at 12/20/2021 1106 ?Last data filed at 12/20/2021 0302 ?Gross per 24 hour  ?Intake --  ?Output 1000 ml  ?Net -1000 ml  ?  ? ?  ? ?Physical Exam: ? ?Constitutional: No distress . Vital signs reviewed.Normal appearing ?HENT: Normocephalic.  Atraumatic.  Oral mucosa pink and moist.  NCAT. ?Eyes: EOMI. No discharge. ?Cardiovascular: RRR. No JVD. ?Respiratory: CTA Bilaterally. Normal effort. ?GI: BS +. Non-distended. ?Ext: No edema. ?Musc: No edema or tenderness in extremities. ?Psychiatric: Appropriate affect impulsive, anxious. ?Skin: Maculopapular rash along back present but almost gone. Has multiple bruises on right thigh upper extremities, right hip groin is purple some bruising. ?Neurological: Patient is alert to person place and time and situation.  Can follow simple commands left central 7 and left hemianopsia.  Dysarthric. Motor: LLU/LLE 0/5 with significant flexor tone, LUE 1/4, flex/ext tone LUE 1/4, LLE 1/4, flex/ext tone LLE 2-3/4.  Left sternocleidomastoid 2/4.  DTR's 3+ (left side). Spasticity improved in left hip. Sensory: Can feel light touch on bottom of left toes. ?Musculoskeletal: Mild left hip pain, no pain with palpation. ? ?Vital Signs ?Blood pressure 135/63, pulse (!) 58, temperature (!) 97.3 ?F (36.3 ?C), temperature source Oral, resp. rate 18, height 6\' 1"  (1.854 m), weight 89 kg, SpO2 96  %. ? ? ? ?Assessment/Plan: ?1. Functional deficits which require 3+ hours per day of interdisciplinary therapy in a comprehensive inpatient rehab setting. ?Physiatrist is providing close team supervision and 24 hour management of active medical problems listed below. ?Physiatrist and rehab team continue to assess barriers to discharge/monitor patient progress toward functional and medical goals ? ?Care Tool: ? ?Bathing ? Bathing activity did not occur: Safety/medical concerns ?Body parts bathed by patient: Chest, Abdomen, Front perineal area, Right upper leg, Left upper leg, Face, Left arm  ? Body parts bathed by helper: Right arm, Buttocks, Right lower leg, Left lower leg ?  ?  ?Bathing assist Assist Level: Maximal Assistance - Patient 24 - 49% ?  ?  ?Upper Body Dressing/Undressing ?Upper body dressing Upper body dressing/undressing activity did not occur (including orthotics): Safety/medical concerns ?What is the patient wearing?: Pull over shirt ?   ?Upper body assist Assist Level: 2 Helpers ?   ?Lower Body Dressing/Undressing ?Lower body dressing ? ? ?   ?What is the patient wearing?: Pants ? ?  ? ?Lower body assist Assist for lower body dressing: 2 Helpers ?   ? ?Toileting ?Toileting    ?Toileting assist Assist for toileting: 2 Helpers ?  ?  ?Transfers ?Chair/bed transfer ? ?Transfers assist ? Chair/bed transfer activity did not occur: Safety/medical concerns ? ?Chair/bed transfer assist level: 2 Helpers (Slide board.) ?  ?  ?  Locomotion ?Ambulation ? ? ?Ambulation assist ? ? Ambulation activity did not occur: Safety/medical concerns ? ?  ?  ?   ? ?Walk 10 feet activity ? ? ?Assist ? Walk 10 feet activity did not occur: Safety/medical concerns ? ?  ?   ? ?Walk 50 feet activity ? ? ?Assist Walk 50 feet with 2 turns activity did not occur: Safety/medical concerns ? ?  ?   ? ? ?Walk 150 feet activity ? ? ?Assist Walk 150 feet activity did not occur: Safety/medical concerns ? ?  ?  ?  ? ?Walk 10 feet on uneven  surface  ?activity ? ? ?Assist Walk 10 feet on uneven surfaces activity did not occur: Safety/medical concerns ? ? ?  ?   ? ?Wheelchair ? ? ? ? ?Assist Is the patient using a wheelchair?: Yes ?Type of Wheelchair: Manual ?Wheelchair activity did not occur: Safety/medical concerns ? ?  ?   ? ? ?Wheelchair 50 feet with 2 turns activity ? ? ? ?Assist ? ?  ?Wheelchair 50 feet with 2 turns activity did not occur: Safety/medical concerns ? ? ?   ? ?Wheelchair 150 feet activity  ? ? ? ?Assist ? Wheelchair 150 feet activity did not occur: Safety/medical concerns ? ? ?   ? ?Blood pressure 135/63, pulse (!) 58, temperature (!) 97.3 ?F (36.3 ?C), temperature source Oral, resp. rate 18, height 6\' 1"  (1.854 m), weight 89 kg, SpO2 96 %. ? ?Medical Problem List and Plan: ?1. Functional deficits secondary to right MCA infarct likely embolic d/t atrial fib. ?-Patient may shower. ? -ELOS/Goals: 14-20 days,  min assist with PT, OT, SLP. ?-Continue CIR: PT, OT, SLP. ?2.  Antithrombotics: ?-DVT/anticoagulation:  Eliquis started on 12/08/21.  Dopplers show no blockage. ?            -antiplatelet therapy:N/A ?-4/15 Continue Eliquis 5 mg daily. ?3. Pain Management for left hip pain: Continue encouraging use of foods from list that aid in pain management. ?-XR without evidence of fracture. ?-Continue focus on spasticity control and ROM. ?-Continue K pad use ?-Continue Tylenol prn and Voltaren gel as needed. ?4/16 Pain controlled today. ?4. Mood: LCSW did follow for evaluation and support.   ?-Antipsychotic agents: N/A ?5. Neuropsych: Patient may be intermittently capable of making decisions on his own behalf. ?6. Skin/Wound Care: Routine skin checks ?Hydrocortisone for rash on back.  Maculopapular rash is improving. ?7. Fluids/Electrolytes/Nutrition: Routine in and outs with follow-up chemistries ?Encourage p.o. intake. ?8. A-fib: Monitor heart rate 3 times daily heart rate continues to remain in the 90s. ?-Monitor for symptoms with  activity. ?9. Dysphagia: Continue D2 nectar liquids with supervision for safety. ?-Encourage fluids Labs continue to be within normal limits. ?10. Hyperlipidemia for HDL continue Crestor 20 mg. ?11.  Constipation: Continue senna twice daily. ?-4/7 multiple BMs after sorbitol. ?-4/11.  Stop Senna since patient's stools are pasty and frequent and he is incontinent -4/15 Pt reports normal BM today. ?12.acute blood loss anemia: Hemoglobin down from 14.4-12.2. ?- Monitor for signs of bleeding. ?- 4/5 Hgb up to 13.8 ?--4/10 Hemoglobin 13.4 ?--Continue to trend Hgb with next CBC on Monday 12/21/21. ?13.  History of cervical DDD with cord compression: S\P ACDF with residual numbness in bilateral hands. ?14.  Lumbar stenosis: S\P decompression January 2023. ?- 4/11 X-rays of lumbar spine was order because patient had a recent fall and is worried about her hardware. ?- No acute osseous abnormality identified.  Interval L4-5 fusion. ?15.  Impaired Fasting glucose: Hemoglobin  A1c-5.4. ?-Not currently on medication. ?-Continue to trend and monitor. ?-4/16 CBG's are trending in the 120's-130's ?16.  Transaminitis: Mild elevation on 4 5. ?Likely reactive with further follow-up. ?410 LFTs have returned to baseline. ?17.  Spastic left hemiparesis ?- Resting orthotic in place positioning and stretching per therapy ?- Improving, tizanidine continue at 4 mg at bedtime ?- Likely Botox candidate as an outpatient ?- Discussed baclofen can also help with spasticity. ?- Continue 50 mg nightly dantrolene to help with spasticity. ?- Continue tramadol 50 mg every 6 hours. ?-4/16 Reports decreased spacticity today. ?18.  Hiccups, improved, possibly resolved. ?-4/16 continue baclofen 5 mg 3 times daily. ?19.  Daytime some somnolence: Continue tizanidine 4 mg at bedtime continue baclofen dose 5 mg 3 times daily. ?20. bradycardia:  ?-4/16 Continue to monitor 3 times daily. ?-Has occasional episodes of HR in upper 50's, asymptommatic ? ? ?   ?LOS: ?12 days ?A FACE TO FACE EVALUATION WAS PERFORMED ? ?Luetta Nutting, FNP ?12/20/2021, 11:06 AM  ? ? ? ?

## 2021-12-20 NOTE — Progress Notes (Signed)
Physical Therapy Session Note ? ?Patient Details  ?Name: Patrick Rose ?MRN: [id number] ?Date of Birth: 02/05/1951 ? ?Today's Date: 12/20/2021 ?PT Individual Time: 1829-9371 ?PT Individual Time Calculation (min): 45 min  ? ?Short Term Goals: ?Week 2:  PT Short Term Goal 1 (Week 2): Pt will perform bed mobility with consistent MaxA and imrproved ability to follow instructions. ?PT Short Term Goal 2 (Week 2): Pt will perform sit<>stand with Max A using LRAD. ?PT Short Term Goal 3 (Week 2): Patient will perform sitting balance with supervision >2 min with mod cues for midline orientation. ?PT Short Term Goal 4 (Week 2): Patient will perform chair<>chair transfers with mod A +2. ? ?Skilled Therapeutic Interventions/Progress Updates:  ?  Pt received seated in bed, agreeable to PT session. Pt noted to have some food pocketed in L side of mouth, reports he had some assistance with breakfast but wishes to eat more. Pt agreeable to getting dressed, transferring to TIS chair, then working on breakfast and oral hygiene. Pt reports pain in his L hip this AM, not rated. Pt reports being premedicated for pain, declines further intervention. Pt found to be incontinent of urine and stool in his brief despite purewick catheter. Pt is dependent for pericare and brief change, dependent x 2 for rolling L/R due to significant pain in L hip with rolling. Pt also exhibits LLE flexor tone during mobility. Pt is dependent to don shorts and shirt at bed level. Due to time constraints pt unable to transfer to TIS this session. HOB elevated to 40 degrees and provided Supervision assist while pt takes a few bites of oatmeal and sips of thickened water. Nursing notified that pt wishes to continue eating breakfast and will need Supervision provided as well as oral hygiene following breakfast. Pt left seated in bed with needs in reach, bed alarm in place. Pt frequently hyperverbal throughout session with cues needed to attend to tasks. ? ?Therapy  Documentation ?Precautions:  ?Precautions ?Precautions: Fall ?Precaution Comments: dense L hemipareisis with UE/LE tone, pushes to L, limited focus ?Restrictions ?Weight Bearing Restrictions: No ? ? ? ? ?Therapy/Group: Individual Therapy ? ? ?Peter Congo, PT, DPT, CSRS ?12/20/2021, 10:28 AM  ?

## 2021-12-21 DIAGNOSIS — I63511 Cerebral infarction due to unspecified occlusion or stenosis of right middle cerebral artery: Secondary | ICD-10-CM | POA: Diagnosis not present

## 2021-12-21 LAB — BASIC METABOLIC PANEL
Anion gap: 6 (ref 5–15)
BUN: 17 mg/dL (ref 8–23)
CO2: 28 mmol/L (ref 22–32)
Calcium: 9.2 mg/dL (ref 8.9–10.3)
Chloride: 103 mmol/L (ref 98–111)
Creatinine, Ser: 0.98 mg/dL (ref 0.61–1.24)
GFR, Estimated: >60 mL/min (ref >60)
Glucose, Bld: 119 mg/dL — ABNORMAL HIGH (ref 70–99)
Potassium: 4 mmol/L (ref 3.5–5.1)
Sodium: 137 mmol/L (ref 135–145)

## 2021-12-21 LAB — CBC
HCT: 41.3 % (ref 39.0–52.0)
Hemoglobin: 13.7 g/dL (ref 13.0–17.0)
MCH: 29.3 pg (ref 26.0–34.0)
MCHC: 33.2 g/dL (ref 30.0–36.0)
MCV: 88.4 fL (ref 80.0–100.0)
Platelets: 256 10*3/uL (ref 150–400)
RBC: 4.67 MIL/uL (ref 4.22–5.81)
RDW: 13.5 % (ref 11.5–15.5)
WBC: 6.9 10*3/uL (ref 4.0–10.5)
nRBC: 0 % (ref 0.0–0.2)

## 2021-12-21 MED ORDER — DANTROLENE SODIUM 25 MG PO CAPS
50.0000 mg | ORAL_CAPSULE | Freq: Two times a day (BID) | ORAL | Status: DC
Start: 2021-12-21 — End: 2021-12-26
  Administered 2021-12-21 – 2021-12-26 (×10): 50 mg via ORAL
  Filled 2021-12-21 (×10): qty 2

## 2021-12-21 NOTE — Progress Notes (Addendum)
Physical Therapy Session Note ? ?Patient Details  ?Name: Patrick Rose ?MRN: [id number] ?Date of Birth: 13-Dec-1950 ? ?Today's Date: 12/21/2021 ?PT Individual Time: [id number] ?PT Individual Time Calculation (min): 70 min  ? ?Short Term Goals: ?Week 2:  PT Short Term Goal 1 (Week 2): Pt will perform bed mobility with consistent MaxA and imrproved ability to follow instructions. ?PT Short Term Goal 2 (Week 2): Pt will perform sit<>stand with Max A using LRAD. ?PT Short Term Goal 3 (Week 2): Patient will perform sitting balance with supervision >2 min with mod cues for midline orientation. ?PT Short Term Goal 4 (Week 2): Patient will perform chair<>chair transfers with mod A +2. ? ?Skilled Therapeutic Interventions/Progress Updates:  ?   ?Patient in bed upon PT arrival. Patient alert and agreeable to PT session. Patient denied pain during session. ? ?Focused session on therapeutic gait and standing activities for improved lower extremity and trunk motor control. ? ?Therapeutic Activity: ?Bed Mobility: Patient performed supine to/from sit with mod A for trunk control and lower extremity management to come to sitting and max A to return to lying. Provided verbal cues for progressing through side-lying, and binding knees to chest to bring legs off/on the bed. ?Transfers: Patient performed stand pivot bed<>TIS w/c and sit to/from stand x4 with max-mod A +2. Provided verbal cues for initiation, forward weight shift, and increased hip/knee/trunk extension L>R. ? ?Neuromuscular Re-ed: ?Sitting Balance: ?Sitting EOB >8 min as pants, socks, and tennis shoes were donned with total A from +2 assist, PT focused on midline orientation and looking to the L with visual cues for trunk orientation to the R and had patient reading signs over his bed to the L for L focus. Provided max cues and HOH assist for R hand placement to reduce pushing to the L. ?Therapeutic Gait training: ?Patient ambulated 20 feet x1 and >5 feet x2 limited by  decreased attention to task leading to poor trunk control and initiation of stepping. Utilized 3 musketeer technique with max A +2 and total-max A L limb advancement with +3 for w/c follow ? ?Patient incontinent of bladder during session, attempted to change patient in standing, as above with +3 for peri-care, however patient became incontinent of bowl in standing. Sat on bed pan to complete BM, however, patient too fatigued to perform peri-care in standing again. Transferred back to bed with stand pivot, see above. RN made aware that patient in need of peri-care due to incontinence. Patient covered with a towel and sheet prior to hand-off to nursing staff.  ? ?Patient in bed at end of session with breaks locked, bed alarm set, and all needs within reach.  ? ?Therapy Documentation ?Precautions:  ?Precautions ?Precautions: Fall ?Precaution Comments: dense L hemipareisis with UE/LE tone, pushes to L, limited focus ?Restrictions ?Weight Bearing Restrictions: No ? ? ? ?Therapy/Group: Individual Therapy ? ?Doreene Burke PT, DPT ? ?12/21/2021, 4:44 PM  ?

## 2021-12-21 NOTE — Progress Notes (Signed)
Speech Language Pathology Weekly Progress and Session Note ? ?Patient Details  ?Name: Patrick Rose ?MRN: [id number] ?Date of Birth: [birth year] ? ?Beginning of progress report period: December 14, 2021 ?End of progress report period: December 21, 2021 ? ?Today's Date: 12/21/2021 ?SLP Individual Time: [id number] ?SLP Individual Time Calculation (min): 45 min ? ?Short Term Goals: ?Week 2: SLP Short Term Goal 1 (Week 2): Pt will consume current diet Dys 2 textures and Nectar thick liquids with mod A verbal cues to correct left spillage and pocketing. ?SLP Short Term Goal 1 - Progress (Week 2): Met ?SLP Short Term Goal 2 (Week 2): Pt will consume thin liquid trials via cup with overt s/s of aspiration in less than 25% of trials with Mod verbal cues over 2 sessions prior to MBS. ?SLP Short Term Goal 2 - Progress (Week 2): Met ?SLP Short Term Goal 3 (Week 2): Pt will demonstrate use of speech intelligibility strategies at the sentence level producing 80% intelligibility with min A verbal cues. ?SLP Short Term Goal 3 - Progress (Week 2): Met ?SLP Short Term Goal 4 (Week 2): Patient will demonstrate sustained attention to functional tasks for 3 minutes with Max A verbal cues. ?SLP Short Term Goal 4 - Progress (Week 2): Met ?SLP Short Term Goal 5 (Week 2): Patient will scan/attend to left field of enviornment during functional tasks with Mod verbal cues in 50% of opportunities. ?SLP Short Term Goal 5 - Progress (Week 2): Met ?SLP Short Term Goal 6 (Week 2): Patient will demonstrate functional problem solving for basic and familiar tasks with Mod verbal cues. ?SLP Short Term Goal 6 - Progress (Week 2): Not met ? ?  ?New Short Term Goals: ?Week 3: SLP Short Term Goal 1 (Week 3): Pt will participate in MBSS to assess swallow function/physiology and determine least restrictive diet with 100% completion. ?SLP Short Term Goal 2 (Week 3): Pt will consume recommended diet textures with s/sx concerning for aspiration <25% of the time and Min  A to implement safe swallow strategies. ?SLP Short Term Goal 3 (Week 3): Pt will utilize speech intelligibility strategies within the context of conversation with Supervision A. ?SLP Short Term Goal 4 (Week 3): Pt will complete functional + basic problem-solving tasks with 80% accuracy given Min A. ?SLP Short Term Goal 5 (Week 3): Pt will attend to L visual field to complete table top tasks with 80% accuracy given visual anchor and Supervision question A. ?SLP Short Term Goal 6 (Week 3): Pt will recall 2-3 activties from ST session given Supervision question A. ? ?Weekly Progress Updates: ?Pt continues to demonstrate steady, functional gains towards meeting his long-term cognitive-linguistic, speech intelligibility, and dysphagia goals; pt met 5 out of 6 goals this past week. Pt demonstrates overall improvement in attention, alertness, and tolerance for controlled, thin liquid trials. Recommend MBSS prior to initiation of thin liquid given reports of intermittent throat clearing with thin liquids post-swallow. Improved attention to L field with use of visual anchor and Min verbal A. Continues to be verbally and physically perseverative, though is easily redirectable. Following single-step commands more readily. Speech output remains dysarthric, though pt is compensating well with use of increased vocal intensity. Recommend ongoing ST intervention at this time. Please see above for updated POC. ? ?Intensity: Minumum of 1-2 x/day, 30 to 90 minutes ?Frequency: 3 to 5 out of 7 days ?Duration/Length of Stay: 3 weeks ?Treatment/Interventions: Cognitive remediation/compensation;Cueing hierarchy;Dysphagia/aspiration precaution training;Functional tasks;Medication managment;Patient/family education;Internal/external aids;Speech/Language facilitation ? ? ?Daily Session ? ?Skilled  Therapeutic Interventions:  ?Pt seen, this AM, for skilled ST intervention targeting dysphagia and cognitive-linguistic goals outlined above. Pt  encountered awake/alert and OOB in TIS. Wife present. Agreeable to ST intervention in hospital room. ? ?Completed oral suctioning via [place] with Set-Up A and oral care with Mod A for thoroughness. Oral residuals noted along the lower gum line following initial suctioning by pt. Tolerated therapeutic trials of thin liquid via cup with one instance of throat clearing immediately following swallow initiation, and post-prandially x 1. No additional s/sx concerning for aspiration observed. Vocal quality clear and dry upon phonation. [name] reporting an increase in throat clearing with thin liquid in comparison to nectar-thick liquids. Completed oral motor exercises to increase facial and lingual ROM and awareness for oral residuals on L with model prompts. Given these findings, recommend instrumental swallow study prior to upgrading liquids given several risk factors for development of dysphagia complications; pt and wife in agreement. Re: cognitive-linguistic skills, pt oriented to date with Min visual A for date and DOW; oriented to month and year with Set-Up A. By the end of today's session, pt recalled 3 out of 4 oral motor exercises given Supervision A. Attended to L visual field to converse with his wife without assistance. Attended to L field of calendar with Min verbal A. Verbalized importance of oral care and recommended frequency given Supervision A.  ? ?Session concluded with pt in TIS, chair alarm on, call bell within reach, and all immediate needs met. Wife present. Continue per updated ST POC. ? ?  ? ?Pain ?No/Denies pain; NAD ? ?Therapy/Group: Individual Therapy ? ?Patrick Rose ?12/21/2021, 2:16 PM ? ? ? ? ? ? ?

## 2021-12-21 NOTE — Progress Notes (Signed)
?                                                       PROGRESS NOTE ? ? ?Subjective/Complaints: ? ?Pt reports spasticity still an issue.  ?Wants to drink water, but also low on nectar thick liquids in room.  ? ? ?ROS:  ?Pt denies SOB, abd pain, CP, N/V/C/D, and vision changes ? ? ? ?Objective: ?  ?No results found. ?Recent Labs  ?  12/21/21 ?[id number]  ?WBC 6.9  ?HGB 13.7  ?HCT 41.3  ?PLT 256  ? ?Recent Labs  ?  12/21/21 ?[id number]  ?NA 137  ?K 4.0  ?CL 103  ?CO2 28  ?GLUCOSE 119*  ?BUN 17  ?CREATININE 0.98  ?CALCIUM 9.2  ? ? ?Intake/Output Summary (Last 24 hours) at 12/21/2021 0836 ?Last data filed at 12/21/2021 0401 ?Gross per 24 hour  ?Intake 417 ml  ?Output 2300 ml  ?Net -1883 ml  ?  ? ?  ? ?Physical Exam: ? ? ?General: awake, alert, appropriate, sitting up in bed; NAD ?HENT: conjugate gaze; oropharynx moist ?CV: regular rate; no JVD ?Pulmonary: CTA B/L; no W/R/R- good air movement ?GI: soft, NT, ND, (+)BS ?Psychiatric: appropriate but impulsive ?Neurological: alert ?Skin: Maculopapular rash along back present but almost gone. Has multiple bruises on right thigh upper extremities, right hip groin is purple some bruising. ?Neurological: Patient is alert to person place and time and situation.  Can follow simple commands left central 7 and left hemianopsia.  Dysarthric. Motor: LLU/LLE 0/5 with significant flexor tone, LUE 1/4, flex/ext tone LUE 1/4, LLE 1/4, flex/ext tone LLE 2-3/4.  Left sternocleidomastoid 2/4.  DTR's 3+ (left side).  Sensory: Can feel light touch on bottom of left toes. ?Musculoskeletal: Mild left hip pain, no pain with palpation. ? ?Vital Signs ?Blood pressure 124/75, pulse 64, temperature 97.6 ?F (36.4 ?C), temperature source Oral, resp. rate 18, height 6\' 1"  (1.854 m), weight 89 kg, SpO2 97 %. ? ? ? ?Assessment/Plan: ?1. Functional deficits which require 3+ hours per day of interdisciplinary therapy in a comprehensive inpatient rehab setting. ?Physiatrist is providing close team supervision and 24  hour management of active medical problems listed below. ?Physiatrist and rehab team continue to assess barriers to discharge/monitor patient progress toward functional and medical goals ? ?Care Tool: ? ?Bathing ? Bathing activity did not occur: Safety/medical concerns ?Body parts bathed by patient: Chest, Abdomen, Front perineal area, Right upper leg, Left upper leg, Face, Left arm  ? Body parts bathed by helper: Right arm, Buttocks, Right lower leg, Left lower leg ?  ?  ?Bathing assist Assist Level: Maximal Assistance - Patient 24 - 49% ?  ?  ?Upper Body Dressing/Undressing ?Upper body dressing Upper body dressing/undressing activity did not occur (including orthotics): Safety/medical concerns ?What is the patient wearing?: Pull over shirt ?   ?Upper body assist Assist Level: 2 Helpers ?   ?Lower Body Dressing/Undressing ?Lower body dressing ? ? ?   ?What is the patient wearing?: Pants ? ?  ? ?Lower body assist Assist for lower body dressing: 2 Helpers ?   ? ?Toileting ?Toileting    ?Toileting assist Assist for toileting: 2 Helpers ?  ?  ?Transfers ?Chair/bed transfer ? ?Transfers assist ? Chair/bed transfer activity did not occur: Safety/medical concerns ? ?Chair/bed transfer assist level: 2 Helpers (Slide  board.) ?  ?  ?Locomotion ?Ambulation ? ? ?Ambulation assist ? ? Ambulation activity did not occur: Safety/medical concerns ? ?  ?  ?   ? ?Walk 10 feet activity ? ? ?Assist ? Walk 10 feet activity did not occur: Safety/medical concerns ? ?  ?   ? ?Walk 50 feet activity ? ? ?Assist Walk 50 feet with 2 turns activity did not occur: Safety/medical concerns ? ?  ?   ? ? ?Walk 150 feet activity ? ? ?Assist Walk 150 feet activity did not occur: Safety/medical concerns ? ?  ?  ?  ? ?Walk 10 feet on uneven surface  ?activity ? ? ?Assist Walk 10 feet on uneven surfaces activity did not occur: Safety/medical concerns ? ? ?  ?   ? ?Wheelchair ? ? ? ? ?Assist Is the patient using a wheelchair?: Yes ?Type of Wheelchair:  Manual ?Wheelchair activity did not occur: Safety/medical concerns ? ?  ?   ? ? ?Wheelchair 50 feet with 2 turns activity ? ? ? ?Assist ? ?  ?Wheelchair 50 feet with 2 turns activity did not occur: Safety/medical concerns ? ? ?   ? ?Wheelchair 150 feet activity  ? ? ? ?Assist ? Wheelchair 150 feet activity did not occur: Safety/medical concerns ? ? ?   ? ?Blood pressure 124/75, pulse 64, temperature 97.6 ?F (36.4 ?C), temperature source Oral, resp. rate 18, height 6\' 1"  (1.854 m), weight 89 kg, SpO2 97 %. ? ?Medical Problem List and Plan: ?1. Functional deficits secondary to right MCA infarct likely embolic d/t atrial fib. ?-Patient may shower. ? -ELOS/Goals: 14-20 days,  min assist with PT, OT, SLP. ? Continue CIR- PT, OT and SLP ?2.  Antithrombotics: ?-DVT/anticoagulation:  Eliquis started on 12/08/21.  Dopplers show no blockage. ?            -antiplatelet therapy:N/A ?-4/15 Continue Eliquis 5 mg daily. ?3. Pain Management for left hip pain: Continue encouraging use of foods from list that aid in pain management. ?-XR without evidence of fracture. ?-Continue focus on spasticity control and ROM. ?-Continue K pad use ?-Continue Tylenol prn and Voltaren gel as needed. ? 4/17- Pt reports pain "OK"- cont' regimen ?4. Mood: LCSW did follow for evaluation and support.   ?-Antipsychotic agents: N/A ?5. Neuropsych: Patient may be intermittently capable of making decisions on his own behalf. ?6. Skin/Wound Care: Routine skin checks ?Hydrocortisone for rash on back.  Maculopapular rash is improving. ?7. Fluids/Electrolytes/Nutrition: Routine in and outs with follow-up chemistries ?Encourage p.o. intake. ?8. A-fib: Monitor heart rate 3 times daily heart rate continues to remain in the 90s. ?4/17- HR in 60s-80s- con't regimen ?-Monitor for symptoms with activity. ?9. Dysphagia: Continue D2 nectar liquids with supervision for safety. ?-Encourage fluids Labs continue to be within normal limits. ?10. Hyperlipidemia for HDL continue  Crestor 20 mg. ?11.  Constipation: Continue senna twice daily. ?-4/7 multiple BMs after sorbitol. ?-4/11.  Stop Senna since patient's stools are pasty and frequent and he is incontinent -4/15 Pt reports normal BM today. ?4/17- LBM this AM- mushy- incontinent- con't regimen and monitor- due to stroke, likely why incontinent ?12.acute blood loss anemia: Hemoglobin down from 14.4-12.2. ?- Monitor for signs of bleeding. ?- 4/5 Hgb up to 13.8 ?--4/10 Hemoglobin 13.4 ?--Continue to trend Hgb with next CBC on Monday 12/21/21. ?13.  History of cervical DDD with cord compression: S\P ACDF with residual numbness in bilateral hands. ?14.  Lumbar stenosis: S\P decompression January 2023. ?- 4/11 X-rays of lumbar  spine was order because patient had a recent fall and is worried about her hardware. ?- No acute osseous abnormality identified.  Interval L4-5 fusion. ?15.  Impaired Fasting glucose: Hemoglobin A1c-5.4. ?-Not currently on medication. ?-Continue to trend and monitor. ?16.  Transaminitis: Mild elevation on 4 5. ?Likely reactive with further follow-up. ?410 LFTs have returned to baseline. ?17.  Spastic left hemiparesis ?- Resting orthotic in place positioning and stretching per therapy ?- Improving, tizanidine continue at 4 mg at bedtime ?- Likely Botox candidate as an outpatient ?- Discussed baclofen can also help with spasticity. ?- Continue 50 mg nightly dantrolene to help with spasticity. ?- Continue tramadol 50 mg every 6 hours. ?4/17- spasticity still an issue, but slightly less painful today- will increase Dantrolene to 50 mg BID ?18.  Hiccups, improved, possibly resolved. ?-4/16 continue baclofen 5 mg 3 times daily. ?19.  Daytime some somnolence: Continue tizanidine 4 mg at bedtime continue baclofen dose 5 mg 3 times daily. ?20. bradycardia:  ?-4/16 Continue to monitor 3 times daily. ?-Has occasional episodes of HR in upper 50's, asymptommatic ? ? ?I spent a total of 37   minutes on total care today- >50%  coordination of care- due to d/w team about spasticity and d/w nursing about nectar thick liquids ? ?  ?LOS: ?13 days ?A FACE TO FACE EVALUATION WAS PERFORMED ? ?Courtney Heys, MD ?12/21/2021, 8:36 AM  ? ? ? ?

## 2021-12-21 NOTE — Progress Notes (Signed)
Occupational Therapy Session Note ? ?Patient Details  ?Name: Patrick Rose ?MRN: [id number] ?Date of Birth: March 07, 1951 ? ?Today's Date: 12/21/2021 ?OT Individual Time: 1300-1400 ?OT Individual Time Calculation (min): 60 min  ? ? ?Short Term Goals: ?Week 2:  OT Short Term Goal 1 (Week 2): Pt will sit EOB with no more than mod A with max cueing for focused attention ?OT Short Term Goal 2 (Week 2): Pt will don a shirt with max A ?OT Short Term Goal 3 (Week 2): Pt will demonstrated sustained attention to UB dressing or bathing task with max cueing ?OT Short Term Goal 4 (Week 2): [name] caregiver will demonstrate competency with LUE PROM HEP ? ?Skilled Therapeutic Interventions/Progress Updates:  ?  Pt received supine with no c/o pain, agreeable to OT session. RN present to assist with brief change and bed pad- max A while OT assisted with sidelying. He transferred to EOB with only mod A! He was able to maintain his sitting balance with use of the bottom bed rail and mod cueing for sustained attention to task. He used the slideboard with mod A- incredible progress!! He was mostly pulling with his RUE to get into the chair vs actually pushing through his LE. Oral care completed in the chair with min A. He was taken via TIS w/c to the therapy gym. Remainder of session focused on standing balance and midline awareness. He required constant max cueing for sustained attention to task. Max A +2 to stand from the TIS w/c. When he could attend to R reaching he was able to maintain balance with only mod A! He completed several trials. He returned to his room and was left sitting up with all needs met. Chair alarm set.  ? ?Saebo Stim One was applied to his L tricep for muscle activation and L attention to the LUE. No reports of pain or adverse skin reaction. 45 min unattended e-stim.  ?330 pulse width ?35 Hz pulse rate ?On 8 sec/ off 8 sec ?Ramp up/ down 2 sec ?Symmetrical Biphasic wave form  ?Max intensity 121m at 500 Ohm  load ? ? ? ?Therapy Documentation ?Precautions:  ?Precautions ?Precautions: Fall ?Precaution Comments: dense L hemipareisis with UE/LE tone, pushes to L, limited focus ?Restrictions ?Weight Bearing Restrictions: No ? ?Therapy/Group: Individual Therapy ? ?SCurtis Sites?12/21/2021, 6:32 AM ?

## 2021-12-21 NOTE — Progress Notes (Addendum)
Physical Therapy Session Note ? ?Patient Details  ?Name: Patrick Rose ?MRN: [id number] ?Date of Birth: 1951-04-03 ? ?Today's Date: 12/21/2021 ?PT Individual Time: 0867-6195 ?PT Individual Time Calculation (min): 40 min  ? ?Short Term Goals: ?Week 1:  PT Short Term Goal 1 (Week 1): Pt will perform bed mobility with consistent MaxA and imrproved ability to follow instructions. ?PT Short Term Goal 1 - Progress (Week 1): Progressing toward goal ?PT Short Term Goal 2 (Week 1): Pt will perform sit<>stand with consistent Mod/ Max +2. ?PT Short Term Goal 2 - Progress (Week 1): Met ?PT Short Term Goal 3 (Week 1): Pt will perform seat to seat transfers with consistent MaxA +2 ?PT Short Term Goal 3 - Progress (Week 1): Progressing toward goal ?PT Short Term Goal 4 (Week 1): Pt will demonstrate improved balance with decreased pushing to L side to <50%. ?PT Short Term Goal 4 - Progress (Week 1): Met ?Week 2:  PT Short Term Goal 1 (Week 2): Pt will perform bed mobility with consistent MaxA and imrproved ability to follow instructions. ?PT Short Term Goal 2 (Week 2): Pt will perform sit<>stand with Max A using LRAD. ?PT Short Term Goal 3 (Week 2): Patient will perform sitting balance with supervision >2 min with mod cues for midline orientation. ?PT Short Term Goal 4 (Week 2): Patient will perform chair<>chair transfers with mod A +2. ?Week 3:    ? ?Skilled Therapeutic Interventions/Progress Updates:  ?  Pt initially oob in wc attempting to wheel out of room stating - I needed to get some water.  Discussed need to use callbell to notify nursing of needs. ? ?Therapist transported pt to nursing station and prepared nectar thick water. ? ?Pt transported to day room. ?Attempted standing task using Hi/Lo table and large "connect the dots" activity to Pt R to encourage sustained activity + wt shift to R/upright in standing. ? ?Attempted Sit to stand mult times using sheet at hips and +2 max assist but pt unable to achieve adequate  upright posture or attend to task long enough for planned activity.  ?Pt c/o fatigue and feeling "shaky".  Switched to sitting and performing above task + reaching to floor multiple times to pick up objects to R to encourage trunk elongation L.   ? ?In sitting pt leaned to R placing forearm on widowsill for r lateral trunk/L elongation tand worked on cervical/thoracic extension + rotation to L to lvisually scan/locate and describe objects in therapist hand to pt L. ? ?Pt repositioned in midline at end of activity, wc tilted for safety.  Pt transported back to room.  Pt left oob in wc w/alarm belt set and needs in reach ? ? ? ?Therapy Documentation ?Precautions:  ?Precautions ?Precautions: Fall ?Precaution Comments: dense L hemipareisis with UE/LE tone, pushes to L, limited focus ?Restrictions ?Weight Bearing Restrictions: No ? ? ? ? ?Therapy/Group: Individual Therapy ?Callie Fielding, PT ? ? ?Patrick Rose ?12/21/2021, 3:36 PM  ?

## 2021-12-22 ENCOUNTER — Inpatient Hospital Stay (HOSPITAL_COMMUNITY): Payer: Medicare (Managed Care)

## 2021-12-22 DIAGNOSIS — I63511 Cerebral infarction due to unspecified occlusion or stenosis of right middle cerebral artery: Secondary | ICD-10-CM | POA: Diagnosis not present

## 2021-12-22 MED ORDER — METHYLPHENIDATE HCL 5 MG PO TABS
5.0000 mg | ORAL_TABLET | Freq: Two times a day (BID) | ORAL | Status: DC
  Administered 2021-12-22 – 2021-12-25 (×7): 5 mg via ORAL
  Filled 2021-12-22 (×7): qty 1

## 2021-12-22 NOTE — Progress Notes (Signed)
?                                                       PROGRESS NOTE ? ? ?Subjective/Complaints: ? ?Pt reports sleepy- doesn't want to wake up yet- LBM yesterday ?No issues ? ?ROS:  ?Limited by sedation ? ?Objective: ?  ?No results found. ?Recent Labs  ?  12/21/21 ?[id number]  ?WBC 6.9  ?HGB 13.7  ?HCT 41.3  ?PLT 256  ? ?Recent Labs  ?  12/21/21 ?[id number]  ?NA 137  ?K 4.0  ?CL 103  ?CO2 28  ?GLUCOSE 119*  ?BUN 17  ?CREATININE 0.98  ?CALCIUM 9.2  ? ? ?Intake/Output Summary (Last 24 hours) at 12/22/2021 0959 ?Last data filed at 12/22/2021 0529 ?Gross per 24 hour  ?Intake --  ?Output 1200 ml  ?Net -1200 ml  ?  ? ?  ? ?Physical Exam: ? ? ? ?General: woke briefly; supine in bed;  NAD ?HENT: conjugate gaze; oropharynx moist ?CV: regular rate; no JVD ?Pulmonary: CTA B/L; no W/R/R- good air movement ?GI: soft, NT, ND, (+)BS ?Psychiatric: appropriate but sleepy ?Neurological: sleepy-  ?Skin:  Has multiple bruises on right thigh upper extremities, right hip groin is purple some bruising. ?Neurological: Patient is alert to person place and time and situation.  Can follow simple commands left central 7 and left hemianopsia.  Dysarthric. Motor: LLU/LLE 0/5 with significant flexor tone, LUE 1/4, flex/ext tone LUE 1/4, LLE 1/4, flex/ext tone LLE 2-3/4.  Left sternocleidomastoid 2/4.  DTR's 3+ (left side).  Sensory: Can feel light touch on bottom of left toes. ?Musculoskeletal: Mild left hip pain, no pain with palpation. ? ?Vital Signs ?Blood pressure 122/64, pulse 84, temperature 98.3 ?F (36.8 ?C), resp. rate 14, height 6\' 1"  (1.854 m), weight 89 kg, SpO2 94 %. ? ? ? ?Assessment/Plan: ?1. Functional deficits which require 3+ hours per day of interdisciplinary therapy in a comprehensive inpatient rehab setting. ?Physiatrist is providing close team supervision and 24 hour management of active medical problems listed below. ?Physiatrist and rehab team continue to assess barriers to discharge/monitor patient progress toward functional and  medical goals ? ?Care Tool: ? ?Bathing ? Bathing activity did not occur: Safety/medical concerns ?Body parts bathed by patient: Chest, Abdomen, Front perineal area, Right upper leg, Left upper leg, Face, Left arm  ? Body parts bathed by helper: Right arm, Buttocks, Right lower leg, Left lower leg ?  ?  ?Bathing assist Assist Level: Maximal Assistance - Patient 24 - 49% ?  ?  ?Upper Body Dressing/Undressing ?Upper body dressing Upper body dressing/undressing activity did not occur (including orthotics): Safety/medical concerns ?What is the patient wearing?: Pull over shirt ?   ?Upper body assist Assist Level: 2 Helpers ?   ?Lower Body Dressing/Undressing ?Lower body dressing ? ? ?   ?What is the patient wearing?: Pants ? ?  ? ?Lower body assist Assist for lower body dressing: 2 Helpers ?   ? ?Toileting ?Toileting    ?Toileting assist Assist for toileting: 2 Helpers ?  ?  ?Transfers ?Chair/bed transfer ? ?Transfers assist ? Chair/bed transfer activity did not occur: Safety/medical concerns ? ?Chair/bed transfer assist level: 2 Helpers (Slide board.) ?  ?  ?Locomotion ?Ambulation ? ? ?Ambulation assist ? ? Ambulation activity did not occur: Safety/medical concerns ? ?  ?  ?   ? ?  Walk 10 feet activity ? ? ?Assist ? Walk 10 feet activity did not occur: Safety/medical concerns ? ?  ?   ? ?Walk 50 feet activity ? ? ?Assist Walk 50 feet with 2 turns activity did not occur: Safety/medical concerns ? ?  ?   ? ? ?Walk 150 feet activity ? ? ?Assist Walk 150 feet activity did not occur: Safety/medical concerns ? ?  ?  ?  ? ?Walk 10 feet on uneven surface  ?activity ? ? ?Assist Walk 10 feet on uneven surfaces activity did not occur: Safety/medical concerns ? ? ?  ?   ? ?Wheelchair ? ? ? ? ?Assist Is the patient using a wheelchair?: Yes ?Type of Wheelchair: Manual ?Wheelchair activity did not occur: Safety/medical concerns ? ?  ?   ? ? ?Wheelchair 50 feet with 2 turns activity ? ? ? ?Assist ? ?  ?Wheelchair 50 feet with 2 turns  activity did not occur: Safety/medical concerns ? ? ?   ? ?Wheelchair 150 feet activity  ? ? ? ?Assist ? Wheelchair 150 feet activity did not occur: Safety/medical concerns ? ? ?   ? ?Blood pressure 122/64, pulse 84, temperature 98.3 ?F (36.8 ?C), resp. rate 14, height 6\' 1"  (1.854 m), weight 89 kg, SpO2 94 %. ? ?Medical Problem List and Plan: ?1. Functional deficits secondary to right MCA infarct likely embolic d/t atrial fib. ?-Patient may shower. ? -ELOS/Goals: 14-20 days,  min assist with PT, OT, SLP. ? Continue CIR- PT, OT and SLP ? Team conference today to f/u on progress ?2.  Antithrombotics: ?-DVT/anticoagulation:  Eliquis started on 12/08/21.  Dopplers show no blockage. ?            -antiplatelet therapy:N/A ?-4/15 Continue Eliquis 5 mg daily. ?3. Pain Management for left hip pain: Continue encouraging use of foods from list that aid in pain management. ?-XR without evidence of fracture. ?-Continue focus on spasticity control and ROM. ?-Continue K pad use ?-Continue Tylenol prn and Voltaren gel as needed. ? 4/17- Pt reports pain "OK"- cont' regimen ?4. Mood: LCSW did follow for evaluation and support.   ?-Antipsychotic agents: N/A ?5. Neuropsych: Patient may be intermittently capable of making decisions on his own behalf. ?6. Skin/Wound Care: Routine skin checks ?Hydrocortisone for rash on back.  Maculopapular rash is improving. ?7. Fluids/Electrolytes/Nutrition: Routine in and outs with follow-up chemistries ?Encourage p.o. intake. ?8. A-fib: Monitor heart rate 3 times daily heart rate continues to remain in the 90s. ?4/17- HR in 60s-80s- con't regimen ?-Monitor for symptoms with activity. ?9. Dysphagia: Continue D2 nectar liquids with supervision for safety. ?-Encourage fluids Labs continue to be within normal limits. ?10. Hyperlipidemia for HDL continue Crestor 20 mg. ?11.  Constipation: Continue senna twice daily. ?-4/7 multiple BMs after sorbitol. ?-4/11.  Stop Senna since patient's stools are pasty and  frequent and he is incontinent -4/15 Pt reports normal BM today. ?4/17- LBM this AM- mushy- incontinent- con't regimen and monitor- due to stroke, likely why incontinent ?4/18- LBM yesterday ?12.acute blood loss anemia: Hemoglobin down from 14.4-12.2. ?- Monitor for signs of bleeding. ?- 4/5 Hgb up to 13.8 ?--4/10 Hemoglobin 13.4 ?--Continue to trend Hgb with next CBC on Monday 12/21/21. ?13.  History of cervical DDD with cord compression: S\P ACDF with residual numbness in bilateral hands. ?14.  Lumbar stenosis: S\P decompression January 2023. ?- 4/11 X-rays of lumbar spine was order because patient had a recent fall and is worried about her hardware. ?- No acute osseous abnormality identified.  Interval L4-5 fusion. ?15.  Impaired Fasting glucose: Hemoglobin A1c-5.4. ?-Not currently on medication. ?-Continue to trend and monitor. ?16.  Transaminitis: Mild elevation on 4 5. ?Likely reactive with further follow-up. ?4/10 LFTs have returned to baseline. ?17.  Spastic left hemiparesis ?- Resting orthotic in place positioning and stretching per therapy ?- Improving, tizanidine continue at 4 mg at bedtime ?- Likely Botox candidate as an outpatient ?- Discussed baclofen can also help with spasticity. ?- Continue 50 mg nightly dantrolene to help with spasticity. ?- Continue tramadol 50 mg every 6 hours. ?4/17- spasticity still an issue, but slightly less painful today- will increase Dantrolene to 50 mg BID ?18.  Hiccups, improved, possibly resolved. ?-4/16 continue baclofen 5 mg 3 times daily. ?19.  Daytime some somnolence: Continue tizanidine 4 mg at bedtime continue baclofen dose 5 mg 3 times daily. ?20. bradycardia:  ?-4/16 Continue to monitor 3 times daily. ?-Has occasional episodes of HR in upper 50's, asymptommatic ? ? ?I spent a total of  38  minutes on total care today- >50% coordination of care- due to team conference today ? ?  ?LOS: ?14 days ?A FACE TO FACE EVALUATION WAS PERFORMED ? ?Courtney Heys,  MD ?12/22/2021, 9:59 AM  ? ? ? ?

## 2021-12-22 NOTE — Patient Care Conference (Signed)
Inpatient RehabilitationTeam Conference and Plan of Care Update ?Date: 12/22/2021   Time: 10:43 AM  ? ? ?Patient Name: Patrick Rose      ?Medical Record Number: [id number]  ?Date of Birth: [birth year] ?Sex: Male         ?Room/Bed: 5C02C/5C02C-01 ?Payor Info: Payor: [employer] MEDICARE / Plan: [employer] MEDICARE HMO/PPO / Product Type: *No Product type* /   ? ?Admit Date/Time:  12/08/2021  5:04 PM ? ?Primary Diagnosis:  Acute ischemic right MCA stroke (HCC) ? ?Hospital Problems: Principal Problem: ?  Acute ischemic right MCA stroke (HCC) ? ? ? ?Expected Discharge Date: Expected Discharge Date: 01/12/22 ? ?Team Members Present: ?Physician leading conference: Dr. Genice Rouge ?Social Worker Present: Lavera Guise, BSW ?Nurse Present: Kennyth Arnold, RN ?PT Present: Karolee Stamps, PT ?OT Present: Jake Shark, OT ?SLP Present: Feliberto Gottron, SLP ?PPS Coordinator present : Fae Pippin, SLP ? ?   Current Status/Progress Goal Weekly Team Focus  ?Bowel/Bladder ? ? Incontinent of b/b. LBM 4/17  Regain continence  toilet q3 while awake q4 at hs   ?Swallow/Nutrition/ Hydration ? ? Dys 2 textures with NTL - MBSS pending  Mod I  use of swallowing strategies consistently   ?ADL's ? ? Mod A UB dressing, total A LB, profound attention deficits, pushing, L hemi, max A +2 slideboard and total all other transfers  Min A- lofty goals  L NMR, ADLs, sitting balance, sustained attention, transfers   ?Mobility ? ? Max-mod A bed mobility, max A +2 transfers and therapeutic gait with +3 for w/c follow, continues with controversive pushing and L lower extremity flexor tone/spasticity, significantly limited by attention deficits and bladder/bowl incontinence  min A, mod A gait 75 ft, 8 steps  Midline orientation and L trunk elongation, activity tolerance, functional mobility, L hemi-vody NMR, standing and therapeutic gait training, patient/caregiver education   ?Communication ? ? Supervision  Supervision  use of speech intelligibility strategies    ?Safety/Cognition/ Behavioral Observations ? Mod-Max A  Min A  sustained attentoin, L attention, emergent awareness, recall   ?Pain ? ? Pain left hip, low back  pain </=3/10  Assess Qshift and prn   ?Skin ? ? Bruising, rash to back  no new breakdown  Assess Qshift and prn   ? ? ?Discharge Planning:  ?D/C to home with spouse who will provide 24/7 care.   ?Team Discussion: ?Requesting a stimulant for attention. Will add Ritalin. Increased Dantrolene to 50mg  BID for spasticity, should see a difference later in the week. Discharging home with spouse, has managed Medicare. Incontinent B/B, LBM 4/17. Bowel movements have improved. 5/10 pain reported, MASD to buttocks. Making slow gains. Has attention deficits.  ? ?Patient on target to meet rehab goals: ?yes, min assist goals. Currently mod assist upper body, total assist lower body. Transfers with +2 assist. Unable to stay midline. Continue E-stem. +2 ambulating, +3 Lite gait for safety. Max/total assist cognition. MBS today, can tolerate thin liquids. ? ?*See Care Plan and progress notes for long and short-term goals.  ? ?Revisions to Treatment Plan:  ?Adjusting medications. ?  ?Teaching Needs: ?Family education, medication/pain management, skin/wound care, bowel/bladder management, safety awareness, transfer/gait training, etc. ?  ?Current Barriers to Discharge: ?Decreased caregiver support, Home enviroment access/layout, Incontinence, Wound care, Lack of/limited family support, Insurance for SNF coverage, and Weight ? ?Possible Resolutions to Barriers: ?Family education ?Family assess SNF option ?Order recommended DME ?Follow-up therapy ?  ? ? Medical Summary ?Current Status: spasticity major limiter from stroke- LBM yesterday-won't stay  focused- 5/10 L hip- due to spasticity- MASD on buttocks- little better ? Barriers to Discharge: Behavior;Home enviroment access/layout;Incontinence;Neurogenic Bowel & Bladder;Medical stability;Wound care;Weight bearing  restrictions;Decreased family/caregiver support ? Barriers to Discharge Comments: going home with family- ?Possible Resolutions to [employer]: such profound attention deficits/most severely limting- cannot stay midline or attend to L side at all- cannot Attend to L side- doing Saebo splint- will start Ritalin 5 mg BID with breakfast/lunch- wife "cannot handle stool"-3 people to get him to stand/walk- +2 for other tasks- MBS today- thin liquids- but still needs D2 due to pocketing- max A cognition- 5/9 d/c date set ? ? ?Continued Need for Acute Rehabilitation Level of Care: The patient requires daily medical management by a physician with specialized training in physical medicine and rehabilitation for the following reasons: ?Direction of a multidisciplinary physical rehabilitation program to maximize functional independence : Yes ?Medical management of patient stability for increased activity during participation in an intensive rehabilitation regime.: Yes ?Analysis of laboratory values and/or radiology reports with any subsequent need for medication adjustment and/or medical intervention. : Yes ? ? ?I attest that I was present, lead the team conference, and concur with the assessment and plan of the team. ? ? ?Kennyth Arnold G ?12/22/2021, 2:20 PM  ? ? ? ? ? ? ?

## 2021-12-22 NOTE — Progress Notes (Signed)
Physical Therapy Session Note ? ?Patient Details  ?Name: Patrick Rose ?MRN: [id number] ?Date of Birth: 05-25-1951 ? ?Today's Date: 12/22/2021 ?PT Individual Time: 1415-1500 ?PT Individual Time Calculation (min): 45 min  ? ?Short Term Goals: ?Week 2:  PT Short Term Goal 1 (Week 2): Pt will perform bed mobility with consistent MaxA and imrproved ability to follow instructions. ?PT Short Term Goal 2 (Week 2): Pt will perform sit<>stand with Max A using LRAD. ?PT Short Term Goal 3 (Week 2): Patient will perform sitting balance with supervision >2 min with mod cues for midline orientation. ?PT Short Term Goal 4 (Week 2): Patient will perform chair<>chair transfers with mod A +2. ? ?Skilled Therapeutic Interventions/Progress Updates:  ?   ?Patient in TIS w/c with rehab tech finishing lunch upon PT arrival. Patient alert and agreeable to PT session. Patient denied pain during session. ? ?Performed oral care with total A due to pocketing food on L side of mouth. Patient unable to perform tongue sweep with cues. ? ?Patient transported to 4th floor for therapeutic gait training for improved trunk and lower extremity motor control.   ? ?Therapeutic Activity: ?Transfers: Patient performed sit to/from stand x2 with mod-max a +2. Provided verbal cues for hand placement, forward weight shift, initiation, and hip/knee/trunk extension L>R. ? ?Neuromuscular Re-ed: ?Therapeutic Gait training: ?Patient ambulated 35 feet and 20 feet limited by decreased attention to task leading to poor trunk control and initiation of stepping. Utilized 3 musketeer technique with max A +2 and total-max A L limb advancement with +3 for w/c follow. Improved midline orientation and posture with continues L bias and notable hip flexion and hamstring activation to initiate L limb advancement, required max-total A for L knee extension during advancement and stance. ? ?Patient in TIS w/c at end of session with breaks locked, seat belt alarm set, and all needs  within reach.  ? ?Therapy Documentation ?Precautions:  ?Precautions ?Precautions: Fall ?Precaution Comments: dense L hemipareisis with UE/LE tone, pushes to L, limited focus ?Restrictions ?Weight Bearing Restrictions: No ? ? ? ?Therapy/Group: Individual Therapy ? ?Helayne Seminole PT, DPT ? ?12/22/2021, 4:47 PM  ?

## 2021-12-22 NOTE — Progress Notes (Signed)
Modified Barium Swallow Progress Note ? ?Patient Details  ?Name: Patrick Rose ?MRN: [id number] ?Date of Birth: 1951-08-23 ? ?Today's Date: 12/22/2021 ? ?Modified Barium Swallow completed.  Full report located under Chart Review in the Imaging Section. ? ?Brief recommendations include the following: ? ?Clinical Impression ?Pt seen this date for instrumental swallow study (MBSS) secondary to s/sx c/f pharyngeal dysphagia at bedside, and to assess readiness for upgrading liquid consistencies. Per instrumental findings, pt presents with mild-moderate oral and mild pharyngeal dysphagia in the setting of acute R CVA resulting in moderate-severe cognitive impairment and dysarthria. Please note, performance likely compounded by cognitive deficits to include poor focused attention and working memory.  ? ?Oral phase c/b generalized lingual weakness resulting in lingual pumping at times, bolus holding, oral residuals along L lateral sulci, reduced posterior propulsion of bolus, premature spillage of bolus, and decreased bolus cohesion. Pharyngeal phase c/b reduced base of tongue approximation to posterior pharyngeal wall, decreased pharyngeal peristalsis, minimal reduction in laryngeal closure, and mild-moderate vallecular and minimal to mild pyriform sinus and lateral channel stasis post-swallow. ? ?Aforementioned deficits resulted in inconsistent instances of shallow, trace-transient penetration (PAS 2) with consumption of thin liquid via cup and straw. Attempted compensatory strategies to include bolus hold and effortful swallow with no evidence of change to swallow function nor safety. No evidence of penetration nor aspiration with puree or mechanical soft textures. Cognitive deficits were limiting; therefore, pt required Max multimodal cues to maintain upright/neutral head position, initiate swallow, and perform secondary, dry swallow to clear pharyngeal stasis. No evidence of esophageal etiology to pt's dysphagia  sx's. ? ?Given instrumental findings, recommend continuation of Dysphagia 2 textures; however, may upgrade to thin liquids with adherence to general aspiration precautions + secondary swallow and full supervision for verbal cueing to maintain attention and implement precautions. Results and recommendations were briefly reviewed with pt, though he will need reinforcement and wife will need education. Will plan to see later this date for further education. May upgrade solid textures clinically, as pt becomes more aware of oral cavity and residuals. Recommend initiation of base of tongue exercises, effortful swallow, and oral motor exercises to improve lingual ROM and oral awareness for clearing residuals. ?  ?Swallow Evaluation Recommendations ? ? SLP Diet Recommendations: Dysphagia 2 (Fine chop) solids;Thin liquid ? ? Liquid Administration via: Straw;Cup ? ? Medication Administration: Crushed with puree ? ? Supervision: Full supervision/cueing for compensatory strategies;Patient able to self feed;Comment (intermittent assistance from caregiver or nursing staff) ? ? Compensations: Minimize environmental distractions;Slow rate;Small sips/bites;Lingual sweep for clearance of pocketing;Follow solids with liquid;Multiple dry swallows after each bite/sip ? ? Postural Changes: Remain semi-upright after after feeds/meals (Comment);Seated upright at 90 degrees ? ? Oral Care Recommendations: Oral care before and after PO;Staff/trained caregiver to provide oral care ? ?   ? ? ? ?Patrick Rose ?12/22/2021,10:06 AM ?

## 2021-12-22 NOTE — Progress Notes (Signed)
Occupational Therapy Session Note ? ?Patient Details  ?Name: Patrick Rose ?MRN: [id number] ?Date of Birth: 18-May-1951 ? ?Today's Date: 12/22/2021 ?OT Individual Time: 1015-1030 ?OT Individual Time Calculation (min): 15 min  ?OT co-treatment 15 mins 1030-1045 ?Total treatment time 45 mins (0300-9233) ? ? ?Short Term Goals: ?Week 2:  OT Short Term Goal 1 (Week 2): Pt will sit EOB with no more than mod A with max cueing for focused attention ?OT Short Term Goal 2 (Week 2): Pt will don a shirt with max A ?OT Short Term Goal 3 (Week 2): Pt will demonstrated sustained attention to UB dressing or bathing task with max cueing ?OT Short Term Goal 4 (Week 2): [name] caregiver will demonstrate competency with LUE PROM HEP ? ?Skilled Therapeutic Interventions/Progress Updates:  ?  Individual and co-treatment with PT. OT intervention with focus on bed mobility, SB tranfsers, squat pivot transfers, standing balance, sitting balance, attention to task, and safety awareness to increase independence with BADLs. Rolling R/L in bed to facilitate donning pants, doffing pants/brief, and hygiene following BM; pt requires mod A+2 and max verbal cues to initiate tasks and sustain attention. Supine>sit EOB with max A+2 and max verbal cues. SB tranfser to w/c with max A+2. Sit<>stand from w/c with max A+2 and max verbal cues. Pt incontinent of bowel and returned to bed for hygiene. Squat pivot transfer with tot A+2. Pt remained in bed with PT and Rehab Tech completing hygiene.  ? ?Therapy Documentation ?Precautions:  ?Precautions ?Precautions: Fall ?Precaution Comments: dense L hemipareisis with UE/LE tone, pushes to L, limited focus ?Restrictions ?Weight Bearing Restrictions: No ?  ?Pain: ?Pt c/o Lt hip pain with transitional movement ? ? ?Therapy/Group: Individual Therapy ? ?Rich Brave ?12/22/2021, 12:16 PM ?

## 2021-12-22 NOTE — Progress Notes (Signed)
Speech Language Pathology Daily Session Note ? ?Patient Details  ?Name: Patrick Rose ?MRN: [id number] ?Date of Birth: [birth year] ? ?Today's Date: 12/22/2021 ?SLP Individual Time: 1105-1150 ?SLP Individual Time Calculation (min): 45 min ? ?Short Term Goals: ?Week 3: SLP Short Term Goal 1 (Week 3): Pt will participate in MBSS to assess swallow function/physiology and determine least restrictive diet with 100% completion. ?SLP Short Term Goal 2 (Week 3): Pt will consume recommended diet textures with s/sx concerning for aspiration <25% of the time and Min A to implement safe swallow strategies. ?SLP Short Term Goal 3 (Week 3): Pt will utilize speech intelligibility strategies within the context of conversation with Supervision A. ?SLP Short Term Goal 4 (Week 3): Pt will complete functional + basic problem-solving tasks with 80% accuracy given Min A. ?SLP Short Term Goal 5 (Week 3): Pt will attend to L visual field to complete table top tasks with 80% accuracy given visual anchor and Supervision question A. ?SLP Short Term Goal 6 (Week 3): Pt will recall 2-3 activties from ST session given Supervision question A. ? ?Skilled Therapeutic Interventions: ?Pt seen later this AM for skilled ST intervention targeting dysphagia and cognitive-linguistic goals outlined above. Pt encountered fatigued with eyes closed; lying semi-reclined in bed. Just finished with PT. Agreeable to ST intervention at bedside. Prior to PO trials, t thorough oral care completed with Mod-Max A due to fatigue; continues to require oral care after each meal (mild-moderate oral residuals removed this session). ? ?SLP facilitated today's session by providing Mod multimodal A for consistent use of lingual sweep with Dysphagia 2 and 3 trials in the setting of oral motor impairment. Mild lingual and buccal residuals noted post-prandially with puree boluses appearing beneficial in oral clearance vs thin liquids, which pt swishes and then swallows. This  swishing motor pattern results in mistiming of oral to pharyngeal phase and immediate cough response intermittently. No coughing observed with Dysphagia 2, Dysphagia 3, nor nectar-thick liquid trials. Intermittent coughing observed with thin liquid, particularly when mixed with oral residuals. As trials of thin liquid continued with and without presence of Dysphagia 2 textures, cough was abate. Recommend skilled observation of trial tray of Dysphagia 2 and thin liquids prior to initiation of diet, though will initiate [employer] Protocol between meals, after thorough oral care.  ? ?Re: cognitive-linguistic skills, pt completed visual attention task of organizing/sorting colored cards from [employer] card game with 95% given Min verbal A; scanned to L with Mod I though more errors were noted. Max A for error awareness during card task. Oriented to date (date and DOW) given Min-Mod verbal and visual A. Required re-education of MBSS results and recommendations with pt requiring Max multimodal A for implementation of aspiration precautions during PO intake.  ? ?Session concluded with pt's family present. Pt in bed, bed alarm on, call bell within reach, and all immediate needs met. Continue per current ST POC. ? ?Pain ?Reports pain in R leg; RN notified ? ?Therapy/Group: Individual Therapy ? ?Patrick Rose A Patrick Rose ?12/22/2021, 12:18 PM ?

## 2021-12-22 NOTE — Progress Notes (Signed)
Physical Therapy Session Note ? ?Patient Details  ?Name: Patrick Rose ?MRN: [id number] ?Date of Birth: [birth year] ? ?Today's Date: 12/22/2021 ?PT Co-Treatment Time: 1145-1200 ?PT Co-Treatment Time Calculation (min): 15 min ? ?Short Term Goals: ?Week 2:  PT Short Term Goal 1 (Week 2): Pt will perform bed mobility with consistent MaxA and imrproved ability to follow instructions. ?PT Short Term Goal 2 (Week 2): Pt will perform sit<>stand with Max A using LRAD. ?PT Short Term Goal 3 (Week 2): Patient will perform sitting balance with supervision >2 min with mod cues for midline orientation. ?PT Short Term Goal 4 (Week 2): Patient will perform chair<>chair transfers with mod A +2. ? ?Skilled Therapeutic Interventions/Progress Updates:  ?   ?Patient in bed with OT and rehab tech donning pants bed level upon PT arrival for co-treat with OT. Focused session on neuromotor reeducation for improved motor control with functional mobility.  ? ?Focused on active hip/knee flexion/extension with bed mobility for bridging to don pants and rolling R/L with mod-max  A of 1-2 utilized teach back method for recall of sequencing during rolling with ~50% carry-over, limited by attention to task.  ? ?Patient performed supine to/from sit with mod-max A +2. Focused on activation of L lower extremity to assist with mobility and placement and appropriate use of R hand to minimize pushing when coming to sitting.  ? ?Patient performed a slide board transfer bed>w/c and lateral scoot w/c>bed with mod-max A +2 to 3 to facilitate hand placement, board placement, and head-hips relationship for proper technique and decreased assist with transfers.  ? ?Patient performed sit to stand at the sink with max A +2 with max cues for L hip/knee/trunk extension. Patient with difficulty focusing to cues due to sudden onset of BM. Patient returned to bed due to BM and peri-care and lower body clothing changed performed with total A +2 with bed mobility as above.   ? ?Patient in bed handed off to [place], SLP at end of session.  ? ?Discussed possibility of bowl program for patient with RN due to bowl frequency with therapy and nursing throughout the day.  ? ?Therapy Documentation ?Precautions:  ?Precautions ?Precautions: Fall ?Precaution Comments: dense L hemipareisis with UE/LE tone, pushes to L, limited focus ?Restrictions ?Weight Bearing Restrictions: No ? ? ? ?Therapy/Group: Co-Treatment ? ?Jamyia Fortune L Everlean Bucher PT, DPT, CBIS ? ?12/22/2021, 12:50 PM  ?

## 2021-12-22 NOTE — Progress Notes (Signed)
Occupational Therapy Session Note ? ?Patient Details  ?Name: [name] ?MRN: [id number] ?Date of Birth: 1950/11/22 ? ?Today's Date: 12/22/2021 ?OT Individual Time: 1300-1400 ?OT Individual Time Calculation (min): 60 min  ? ? ?Short Term Goals: ?Week 2:  OT Short Term Goal 1 (Week 2): Pt will sit EOB with no more than mod A with max cueing for focused attention ?OT Short Term Goal 2 (Week 2): Pt will don a shirt with max A ?OT Short Term Goal 3 (Week 2): Pt will demonstrated sustained attention to UB dressing or bathing task with max cueing ?OT Short Term Goal 4 (Week 2): [name] caregiver will demonstrate competency with LUE PROM HEP ? ?Skilled Therapeutic Interventions/Progress Updates:  ?  Pt received supine with no c/o pain at rest, agreeable to OT session. Pt completed bed mobility to the R with min A for thoroughness and max A for peri hygiene. Pt began having a BM and required assist to get on bed pan. New brief donned. Pt completed bridging in bed with assist distally at his feet to don pants. He transferred to the EOB with only mod A. He was more successful in using the bed rail in maintaining sitting balance- requiring only CGA when attending to task. He donned a shirt with mod A sitting EOB. He required frequent cueing for attending to sitting balance- LOB posteriorly. He completed squat pivot transfer to the TIS w/c with max +2 assist. He c/o hip pain during rolling and transfer that was alleviated with rest. He required set up assist and cueing for L attention/scanning while eating lunch. He required full supervision and max cueing for L pocketing and attention to oral bolus. Pt was passed off to PT in room.  ? ? ?Therapy Documentation ?Precautions:  ?Precautions ?Precautions: Fall ?Precaution Comments: dense L hemipareisis with UE/LE tone, pushes to L, limited focus ?Restrictions ?Weight Bearing Restrictions: No ? ? ?Therapy/Group: Individual Therapy ? ?Curtis Sites ?12/22/2021, 6:29 AM ?

## 2021-12-23 DIAGNOSIS — I63511 Cerebral infarction due to unspecified occlusion or stenosis of right middle cerebral artery: Secondary | ICD-10-CM | POA: Diagnosis not present

## 2021-12-23 NOTE — Plan of Care (Signed)
?Problem: RH Car Transfers ?Goal: LTG Patient will perform car transfers with assist (PT) ?Description: LTG: Patient will perform car transfers with assistance (PT). ?Outcome: Not Progressing ?Flowsheets (Taken 12/23/2021 2149) ?LTG: Pt will perform car transfers with assist:: (d/c goal due to lack of progress, recommend medical transport to next destination of care at discharge.) -- ?  ?Problem: RH Furniture Transfers ?Goal: LTG Patient will perform furniture transfers w/assist (OT/PT) ?Description: LTG: Patient will perform furniture transfers  with assistance (OT/PT). ?Outcome: Not Progressing ?Flowsheets (Taken 12/23/2021 2149) ?LTG: Pt will perform furniture transfers with assist:: (d/c goal due to lack of progress, plan for custom wheelchair for sitting OOB due to lack of trunk control.) -- ?  ?Problem: RH Stairs ?Goal: LTG Patient will ambulate up and down stairs w/assist (PT) ?Description: LTG: Patient will ambulate up and down # of stairs with assistance (PT) ?Outcome: Not Progressing ?Flowsheets (Taken 12/23/2021 2149) ?LTG: Pt will ambulate up/down stairs assist needed:: (d/c goal due to lack of progress, anticipate use of a ramp at wheelchair level upon d/c to home.) -- ?  ?Problem: RH Balance ?Goal: LTG Patient will maintain dynamic sitting balance (PT) ?Description: LTG:  Patient will maintain dynamic sitting balance with assistance during mobility activities (PT) ?Flowsheets (Taken 12/23/2021 2149) ?LTG: Pt will maintain dynamic sitting balance during mobility activities with:: (downgraded goal due to slow progress secondary to cognitive, motor control, and midline orientation deficits) Minimal Assistance - Patient > 75% ?Goal: LTG Patient will maintain dynamic standing balance (PT) ?Description: LTG:  Patient will maintain dynamic standing balance with assistance during mobility activities (PT) ?Flowsheets (Taken 12/23/2021 2149) ?LTG: Pt will maintain dynamic standing balance during mobility activities  with:: (downgraded goal due to slow progress secondary to cognitive, motor control, and midline orientation deficits) Maximal Assistance - Patient 25 - 49% ?  ?Problem: Sit to Stand ?Goal: LTG:  Patient will perform sit to stand with assistance level (PT) ?Description: LTG:  Patient will perform sit to stand with assistance level (PT) ?Flowsheets (Taken 12/23/2021 2149) ?LTG: PT will perform sit to stand in preparation for functional mobility with assistance level: (downgraded goal due to slow progress secondary to cognitive, motor control, and midline orientation deficits) Maximal Assistance - Patient 25 - 49% ?  ?Problem: RH Bed Mobility ?Goal: LTG Patient will perform bed mobility with assist (PT) ?Description: LTG: Patient will perform bed mobility with assistance, with/without cues (PT). ?Flowsheets (Taken 12/23/2021 2149) ?LTG: Pt will perform bed mobility with assistance level of: (downgraded goal due to slow progress secondary to cognitive, motor control, and midline orientation deficits) Moderate Assistance - Patient 50 - 74% ?  ?Problem: RH Bed to Chair Transfers ?Goal: LTG Patient will perform bed/chair transfers w/assist (PT) ?Description: LTG: Patient will perform bed to chair transfers with assistance (PT). ?Flowsheets (Taken 12/23/2021 2149) ?LTG: Pt will perform Bed to Chair Transfers with assistance level: (downgraded goal due to slow progress secondary to cognitive, motor control, and midline orientation deficits) Moderate Assistance - Patient 50 - 74% ?Note: downgraded goal due to slow progress secondary to cognitive, motor control, and midline orientation deficits ?  ?Problem: RH Ambulation ?Goal: LTG Patient will ambulate in controlled environment (PT) ?Description: LTG: Patient will ambulate in a controlled environment, # of feet with assistance (PT). ?Flowsheets (Taken 12/23/2021 2149) ?LTG: Pt will ambulate in controlled environ  assist needed:: (downgraded goal due to slow progress secondary to  cognitive, motor control, and midline orientation deficits) Dependent - mechanical lift ?LTG: Ambulation distance in controlled environment: >50  ft ?  ?Problem: RH Wheelchair Mobility ?Goal: LTG Patient will propel w/c in controlled environment (PT) ?Description: LTG: Patient will propel wheelchair in controlled environment, # of feet with assist (PT) ?Flowsheets (Taken 12/23/2021 2149) ?LTG: Pt will propel w/c in controlled environ  assist needed:: (downgraded goal due to slow progress secondary to cognitive, motor control, and midline orientation deficits) Minimal Assistance - Patient > 75% ?LTG: Propel w/c distance in controlled environment: >50 ft ?  ?

## 2021-12-23 NOTE — Progress Notes (Signed)
Physical Therapy Weekly Progress Note ? ?Patient Details  ?Name: Patrick Rose ?MRN: [id number] ?Date of Birth: 1951/07/21 ? ?Beginning of progress report period: December 16, 2021 ?End of progress report period: December 23, 2021 ? ?Today's Date: 12/23/2021 ?PT Individual Time: 0071-2197 ?PT Individual Time Calculation (min): 65 min   ?Patient has met 1 of 4 short term goals.  Patient continues to make slow, but steady progress. Patient is limited by cognitive deficits in attention and recall of new information limiting carry-over between therapy sessions. Patient also continues to be limited by controversive pushing to the L with intermittent improvements with use of compensatory strategies and L lower extremity flexor tone with spasticity (some improvement this week with med changes). He currently requires max A for bed mobility with use of hospital bed features, mod-max A of 2 and intermittent use of +3 for safety with slide board and lateral scoot transfers due to attention deficits and controversive pushing, max A +2 for sit to stand with R upper extremity support, and ambulating 20-30 feet in the Lite gait or with max +2-3 for therapeutic gait training. [name] wife has engaged in verbal education during patient's stay, will progress to hands on training as patient progresses with functional mobility. ? ?Patient continues to demonstrate the following deficits muscle weakness and muscle joint tightness, decreased cardiorespiratoy endurance, abnormal tone, unbalanced muscle activation, decreased coordination, and decreased motor planning, decreased midline orientation and decreased attention to left, decreased initiation, decreased attention, decreased awareness, decreased problem solving, decreased safety awareness, decreased memory, and delayed processing, and decreased sitting balance, decreased standing balance, decreased postural control, hemiplegia, and decreased balance strategies and therefore will continue  to benefit from skilled PT intervention to increase functional independence with mobility. ? ?Patient not progressing toward long term goals.  See goal revision.  Plan of care revisions: downgraded long term goals to mod-max A overall and exploring d/c options for skilled nursing placement due to limited progress with functional mobility and increased caregiver burden at this time. ? ?PT Short Term Goals ?Week 2:  PT Short Term Goal 1 (Week 2): Pt will perform bed mobility with consistent MaxA and imrproved ability to follow instructions. ?PT Short Term Goal 1 - Progress (Week 2): Met ?PT Short Term Goal 2 (Week 2): Pt will perform sit<>stand with Max A using LRAD. ?PT Short Term Goal 3 (Week 2): Patient will perform sitting balance with supervision >2 min with mod cues for midline orientation. ?PT Short Term Goal 3 - Progress (Week 2): Progressing toward goal ?PT Short Term Goal 4 (Week 2): Patient will perform chair<>chair transfers with mod A +2. ?PT Short Term Goal 4 - Progress (Week 2): Progressing toward goal ?Week 3:  PT Short Term Goal 1 (Week 3): Patient will perform sitting balance with supervision >2 min with mod cues for midline orientation. ?PT Short Term Goal 2 (Week 3): Patient will perform chair<>chair transfers with mod A +2. ?PT Short Term Goal 3 (Week 3): Pt will perform sit<>stand with Max A using LRAD. ?PT Short Term Goal 4 (Week 3): Patient will perform bed mobility with mod A >50% of the time with use of hospital bed features. ? ?Skilled Therapeutic Interventions/Progress Updates:  ?   ?Patient in TIS w/c upon PT arrival. Patient alert and agreeable to PT session. Patient reported intermittent L hip pain with onset of spasticity with mobility during session, RN made aware. PT provided repositioning, rest breaks, and distraction as pain interventions throughout session.  ? ?Focused session  on toilet transfers using the [facility] to [facility] and therapeutic gait training for lower extremity motor control  in the Lite Gait.  ? ?Therapeutic Activity: ?Transfers: Patient performed sit to/from stand x4 with max A +2 in the [place]. Provided multimodal cues for initiation, forward weight shift (improved with patient pushing-up from w/c rather than pulling on Stedy bar), hip/knee/trunk extension L>R, and gluteal activation. Patient with increased fatigue on last 2 stands with L foot adduction causing poor balance and positioning. Able to safely reset between stands, but would resort back to compromised position at end of stand. +3 for safety and repositioning following last stand.  ?Patient dependently transferred to the [facility] using the [facility]. Patient incontinent of bladder and continent of small BM on BSC. Peri-care, barrier cream application, and change of lower body clothing performed with total A with patient focus on sitting balance and midline orientation with mod-max cues and facilitation throughout.  ? ?Neuromuscular Re-ed: ?Therapeutic Gait training: ?Patient ambulated 25 feet and 20 feet limited by decreased attention to task leading to poor trunk control and initiation of stepping. Utilized Lite Gait with ~30 % body weight support and total-max A L limb advancement with +3 for weight shifting/trunk control and management of device. Improved midline orientation and posture with continued L bias and notable intermittent hip flexion and hamstring activation to initiate L limb advancement, required max-total A for L knee extension during advancement and stance. Attempted to use white screen to limit distractions, but patient both externally and internally distracted by harness straps and internal monologue.  ?  ?Patient in TIS w/c with his wife in the room at end of session with breaks locked, seat belt alarm set, and all needs within reach.  ? ?Therapy Documentation ?Precautions:  ?Precautions ?Precautions: Fall ?Precaution Comments: dense L hemipareisis with UE/LE tone, pushes to L, limited focus ?Restrictions ?Weight  Bearing Restrictions: No ? ? ?Therapy/Group: Individual Therapy ? ?Clifton Safley L Montez Cuda ?12/23/2021, 8:10 PM  ?

## 2021-12-23 NOTE — Plan of Care (Signed)
?  Problem: RH BOWEL ELIMINATION ?Goal: RH STG MANAGE BOWEL WITH ASSISTANCE ?Description: STG Manage Bowel with [place]. ?Outcome: Progressing ?Goal: RH STG MANAGE BOWEL W/MEDICATION W/ASSISTANCE ?Description: STG Manage Bowel with Medication with [place]. ?Outcome: Progressing ?  ?Problem: RH BLADDER ELIMINATION ?Goal: RH STG MANAGE BLADDER WITH ASSISTANCE ?Description: STG Manage Bladder With Min Assistance ?Outcome: Progressing ?Goal: RH STG MANAGE BLADDER WITH MEDICATION WITH ASSISTANCE ?Description: STG Manage Bladder With Medication With [place]. ?Outcome: Progressing ?  ?Problem: RH SKIN INTEGRITY ?Goal: RH STG MAINTAIN SKIN INTEGRITY WITH ASSISTANCE ?Description: STG Maintain Skin Integrity With [employer]. ?Outcome: Progressing ?Goal: RH STG ABLE TO PERFORM INCISION/WOUND CARE W/ASSISTANCE ?Description: STG Able To Perform Incision/Wound Care With [facility]. ?Outcome: Progressing ?  ?Problem: RH SAFETY ?Goal: RH STG ADHERE TO SAFETY PRECAUTIONS W/ASSISTANCE/DEVICE ?Description: STG Adhere to Safety Precautions With Cues and Reminders. ?Outcome: Progressing ?  ?Problem: RH COGNITION-NURSING ?Goal: RH STG ANTICIPATES NEEDS/CALLS FOR ASSIST W/ASSIST/CUES ?Description: STG Anticipates Needs/Calls for Assist With Cues and Reminders. ?Outcome: Progressing ?  ?Problem: RH KNOWLEDGE DEFICIT ?Goal: RH STG INCREASE KNOWLEDGE OF STROKE PROPHYLAXIS ?Description: Patient will demonstrate knowledge of medications used to prevent future stokes with educational materials and handouts provided by staff independently at discharge. ?Outcome: Progressing ?  ?Problem: Consults ?Goal: RH STROKE PATIENT EDUCATION ?Description: See Patient Education module for education specifics  ?Outcome: Progressing ?  ?

## 2021-12-23 NOTE — NC FL2 (Signed)
?[place] MEDICAID FL2 LEVEL OF CARE SCREENING TOOL  ?  ? ?IDENTIFICATION  ?Patient Name: ?Patrick Rose Birthdate: 1951/01/09 Sex: male Admission Date (Current Location): ?12/08/2021  ?[place] Number: ? [place] ?  Facility and Address:  ?[facility], [address] [place], [place] [address] ?     Provider Number: ?[id number]  ?Attending Physician Name and Address:  ?Ranelle Oyster, MD ? Relative Name and Phone Number:  ?Khayden Herzberg #[phone] ?   ?Current Level of Care: ?Hospital Recommended Level of Care: ?Nursing Facility Prior Approval Number: ?  ? ?Date Approved/Denied: ?  PASRR Number: ?[id number] A ? ?Discharge Plan: ?SNF ?  ? ?Current Diagnoses: ?Patient Active Problem List  ? Diagnosis Date Noted  ? Middle cerebral artery embolism, right 12/04/2021  ? Acute ischemic right MCA stroke (HCC) 12/03/2021  ? ? ?Orientation RESPIRATION BLADDER Height & Weight   ?  ?Self, Time, Situation, Place ? Normal Incontinent Weight: 194 lb 0.1 oz (88 kg) ?Height:  6\' 1"  (185.4 cm)  ?BEHAVIORAL SYMPTOMS/MOOD NEUROLOGICAL BOWEL NUTRITION STATUS  ?    Incontinent Diet (D2 thin liquids; meds crushed)  ?AMBULATORY STATUS COMMUNICATION OF NEEDS Skin   ?Extensive Assist Verbally Normal (MASD in groin and bottom- using barrier cream to help prevent further breakdown) ?  ?  ?  ?    ?     ?     ? ? ?Personal Care Assistance Level of Assistance  ?Bathing, Dressing, Feeding Bathing Assistance: Maximum assistance ?Feeding assistance: Limited assistance ?Dressing Assistance: Maximum assistance ?   ? ?Functional Limitations Info  ?Sight, Hearing, Speech Sight Info: Adequate ?Hearing Info: Adequate ?Speech Info: Adequate  ? ? ?SPECIAL CARE FACTORS FREQUENCY  ?PT (By licensed PT), OT (By licensed OT), Speech therapy   ?  ?PT Frequency: 5xs per week ?OT Frequency: 5xs per week ?  ?  ?Speech Therapy Frequency: 5xs per week ?   ? ? ?Contractures Contractures Info: Not present  ? ? ?Additional  Factors Info  ?Code Status, Allergies Code Status Info: Full ?Allergies Info: Amoxicillin-pot Clavulanate- Rash ; Penicillins- Hives, Rash ?  ?  ?  ?   ? ?Current Medications (12/23/2021):  This is the current hospital active medication list ?Current Facility-Administered Medications  ?Medication Dose Route Frequency Provider Last Rate Last Admin  ? acetaminophen (TYLENOL) tablet 325-650 mg  325-650 mg Oral Q4H PRN 12/25/2021, PA-C   650 mg at 12/22/21 12/24/21  ? alum & mag hydroxide-simeth (MAALOX/MYLANTA) 200-200-20 MG/5ML suspension 30 mL  30 mL Oral Q4H PRN Love, Pamela S, PA-C      ? apixaban (ELIQUIS) tablet 5 mg  5 mg Oral BID Love, Pamela S, PA-C   5 mg at 12/23/21 0744  ? baclofen (LIORESAL) tablet 5 mg  5 mg Oral TID 12/25/21, MD   5 mg at 12/23/21 1306  ? bisacodyl (DULCOLAX) suppository 10 mg  10 mg Rectal Daily PRN Love, Pamela S, PA-C      ? camphor-menthol (SARNA) lotion   Topical PRN 06-04-1986, PA-C   Given at 12/22/21 12/24/21  ? dantrolene (DANTRIUM) capsule 50 mg  50 mg Oral BID Lovorn, [id number], MD   50 mg at 12/23/21 0744  ? diclofenac Sodium (VOLTAREN) 1 % topical gel 2 g  2 g Topical QID Love, Pamela S, PA-C   2 g at 12/23/21 1206  ? diphenhydrAMINE (BENADRYL) 12.5 MG/5ML elixir 12.5-25 mg  12.5-25 mg Oral Q6H PRN Love,  Evlyn Kanner, PA-C      ? food thickener (SIMPLYTHICK (NECTAR/LEVEL 2/MILDLY THICK)) 10 packet  10 packet Oral PRN Love, Evlyn Kanner, PA-C      ? guaiFENesin-dextromethorphan (ROBITUSSIN DM) 100-10 MG/5ML syrup 5-10 mL  5-10 mL Oral Q6H PRN Love, Pamela S, PA-C      ? hydrocortisone cream 1 %   Topical BID Ranelle Oyster, MD   Given at 12/23/21 [phone]  ? methylphenidate (RITALIN) tablet 5 mg  5 mg Oral BID WC Lovorn, Megan, MD   5 mg at 12/23/21 1206  ? pantoprazole sodium (PROTONIX) 40 mg/20 mL oral suspension 40 mg  40 mg Oral QHS Ranelle Oyster, MD   40 mg at 12/22/21 2031  ? prochlorperazine (COMPAZINE) tablet 5-10 mg  5-10 mg Oral Q6H PRN Jacquelynn Cree, PA-C      ?  Or  ? prochlorperazine (COMPAZINE) injection 5-10 mg  5-10 mg Intramuscular Q6H PRN Love, Evlyn Kanner, PA-C      ? Or  ? prochlorperazine (COMPAZINE) suppository 12.5 mg  12.5 mg Rectal Q6H PRN Love, Pamela S, PA-C      ? rosuvastatin (CRESTOR) tablet 20 mg  20 mg Oral QHS Love, Pamela S, PA-C   20 mg at 12/22/21 2330  ? sodium phosphate (FLEET) 7-19 GM/118ML enema 1 enema  1 enema Rectal Once PRN Love, Pamela S, PA-C      ? tiZANidine (ZANAFLEX) tablet 4 mg  4 mg Oral QHS Raulkar, Drema Pry, MD   4 mg at 12/22/21 2031  ? traMADol (ULTRAM) tablet 50 mg  50 mg Oral Q6H Lovorn, Megan, MD   50 mg at 12/23/21 1206  ? traZODone (DESYREL) tablet 25-50 mg  25-50 mg Oral QHS PRN Jacquelynn Cree, PA-C   50 mg at 12/22/21 2031  ? ? ? ?Discharge Medications: ?Please see discharge summary for a list of discharge medications. ? ?Relevant Imaging Results: ? ?Relevant Lab Results: ? ? ?Additional Information ?SS# [id number] ? ?Gretchen Short, LCSW ? ? ? ? ?

## 2021-12-23 NOTE — Progress Notes (Signed)
Occupational Therapy Weekly Progress Note ? ?Patient Details  ?Name: Patrick Rose ?MRN: [id number] ?Date of Birth: 06/14/1951 ? ?Beginning of progress report period: 12/16/21 ?End of progress report period: 12/23/21 ? ?Today's Date: 12/23/2021 ?OT Individual Time: [phone] ?OT Individual Time Calculation (min): 85 min  ? ? ?Patient has met 3 of 4 short term goals. Patrick Rose has made progress toward his OT goals with small improvements in sitting balance with external support (bed rail), good carryover of UB dressing techniques, and slightly improved L hip pain tolerance. He is still requiring a highly skilled and max-total A +2 level of care d/t profound attention deficits, poor midline awareness, pushers syndrome, and L hemiplegia. His LUE has had little return volitionally but suspect he has more movement than he is able to attend to. The team will continue progressing pt as much as possible but suspect he will require too much care to d/c home with his wife.  ? ?Patient continues to demonstrate the following deficits: muscle weakness and muscle joint tightness, impaired timing and sequencing, abnormal tone, unbalanced muscle activation, ataxia, decreased coordination, and decreased motor planning, decreased midline orientation, decreased attention to left, and left side neglect, decreased initiation, decreased attention, decreased awareness, decreased problem solving, decreased safety awareness, decreased memory, and delayed processing, and decreased sitting balance, decreased standing balance, decreased postural control, hemiplegia, and decreased balance strategies and therefore will continue to benefit from skilled OT intervention to enhance overall performance with BADL and Reduce care partner burden. ? ?Patient not progressing toward long term goals. Goals downgraded to mod A to reflect slow progress toward goals.  ? ?OT Short Term Goals ?Week 2:  OT Short Term Goal 1 (Week 2): Pt will sit EOB with no more than  mod A with max cueing for focused attention ?OT Short Term Goal 1 - Progress (Week 2): Met ?OT Short Term Goal 2 (Week 2): Pt will don a shirt with max A ?OT Short Term Goal 2 - Progress (Week 2): Met ?OT Short Term Goal 3 (Week 2): Pt will demonstrated sustained attention to UB dressing or bathing task with max cueing ?OT Short Term Goal 3 - Progress (Week 2): Progressing toward goal ?OT Short Term Goal 4 (Week 2): [name] caregiver will demonstrate competency with LUE PROM HEP ?OT Short Term Goal 4 - Progress (Week 2): Met ?Week 3:  OT Short Term Goal 1 (Week 3): Pt will sit EOB with mod A without UE support ?OT Short Term Goal 2 (Week 3): Pt will don a shirt with assist only to thread LUE ?OT Short Term Goal 3 (Week 3): Pt will attend to simple grooming task with no more than mod cueing ?OT Short Term Goal 4 (Week 3): Pt will complete transfer with LRAD to the w/c with max A of 1 ? ?Skilled Therapeutic Interventions/Progress Updates:  ?  Pt received supine with no c/o pain, agreeable to OT session. Pt agreeable to try shower today. Session focused on ADL transfers, sustained attention to task, midline orientation/awareness. He completed bed mobility to the EOB with mod A. Max cueing for maintaining sitting balance EOB and to use end bed rail. He completed sit > stand in the stedy with max +2 assist. Initially he was able to maintain improved midline orientation with max cueing but as he fatigued in the session it worsened. He was transferred to a rigid frame roll in shower chair via the stedy. Several pillows and towels used as a L lateral support as well as  external cueing for increasing head and trunk to midline. He completed bathing seated with max A overall and max A to maintain sitting balance statically. Stedy used to transfer back out of shower with several sit <> stands with max-total A +2 d/t now ongoing BM's that required total care. Pt required total A to don shorts and mod A to don a shirt. He was left  sitting up with all needs met, chair alarm set.  ? ? ?[place] Stim One was placed on his L tricep for muscle activation and to promote visual attention to the LUE. He had no reports of pain or adverse skin reactions.  ?330 pulse width ?35 Hz pulse rate ?On 8 sec/ off 8 sec ?Ramp up/ down 2 sec ?Symmetrical Biphasic wave form  ?Max intensity 137m at 500 Ohm load ? ? ?Therapy Documentation ?Precautions:  ?Precautions ?Precautions: Fall ?Precaution Comments: dense L hemipareisis with UE/LE tone, pushes to L, limited focus ?Restrictions ?Weight Bearing Restrictions: No ? ?Therapy/Group: Individual Therapy ? ?SCurtis Sites?12/23/2021, 6:41 AM  ?

## 2021-12-23 NOTE — Progress Notes (Signed)
Patient ID: Patrick Rose, male   DOB: April 13, 1951, 71 y.o.   MRN: [id number] ? ?SW received updates from pt wife reporting that they remain interested in SNF. SW informed will send out referrals, some of preferred locations include [facility], [facility], and [facility]. Wife is still exploring other SNF options. SW explained SNF admission process, and still need insurance approval before transition.  ? ?SW sent out SNF referral.  ? ?NCPASRR# [id number] ? ?Cecile Sheerer, MSW, LCSWA ?Office: [phone] ?Cell: [phone] ?Fax: [phone]  ?

## 2021-12-23 NOTE — Progress Notes (Signed)
?                                                       PROGRESS NOTE ? ? ?Subjective/Complaints: ?Pt sleepy- nodded no issues, but didn't want to wake up.  ? ?Per staff, attention so poor- asked to start Ritalin/something to wake pt up/help with attention.  ? ? ?ROS:  ?Limited by cognition/sedation ? ?Objective: ?  ?DG Swallowing Func-Speech Pathology ? ?Result Date: 12/22/2021 ?Table formatting from the original result was not included. Objective Swallowing Evaluation: Type of Study: MBS-Modified Barium Swallow Study  Patient Details Name: Patrick Rose MRN: [id number] Date of Birth: [birth year] Today's Date: 12/22/2021 Time: SLP Start Time (ACUTE ONLY): 0909 -SLP Stop Time (ACUTE ONLY): 0942 SLP Time Calculation (min) (ACUTE ONLY): 33 min Past Medical History: Past Medical History: Diagnosis Date  Afib ([place])   Cervical myelopathy ([facility])   Lumbar stenosis 09/2021  with pain radiatiing down buttocks to thighs Past Surgical History: Past Surgical History: Procedure Laterality Date  ANTERIOR CERVICAL DECOMP/DISCECTOMY FUSION  2008  APPENDECTOMY    BACK SURGERY    IR CT HEAD LTD  12/03/2021  IR PERCUTANEOUS ART THROMBECTOMY/INFUSION INTRACRANIAL INC DIAG ANGIO  12/03/2021  RADIOLOGY WITH ANESTHESIA N/A 12/03/2021  Procedure: IR WITH ANESTHESIA;  Surgeon: Luanne Bras, MD;  Location: [place];  Service: Radiology;  Laterality: N/A; HPI: 71 year old man who presented to [facility] ED 3/30 as a Code Stroke due to L hemiplegia and R gaze deviation. LKW 1700 3/30. PMHx significant Afib (CHA2DS2-VASc 1, not on AC), aortic aneurysm (stable) admitted with acute stroke s/p TNK and mechanical thrombectomy of right MCA m1 with TICI 3.  Subjective: alert, participatory  Recommendations for follow up therapy are one component of a multi-disciplinary discharge planning process, led by the attending physician.  Recommendations may be updated based on patient status, additional functional criteria and insurance authorization. Assessment /  Plan / Recommendation   12/22/2021   9:58 AM Clinical Impressions Clinical Impression Pt seen this date for instrumental swallow study (MBSS) secondary to s/sx c/f pharyngeal dysphagia at bedside, and to assess readiness for upgrading liquid consistencies. Per instrumental findings, pt presents with mild-moderate oral and mild pharyngeal dysphagia in the setting of acute R CVA resulting in moderate-severe cognitive impairment and dysarthria. Please note, performance likely compounded by cognitive deficits to include poor focused attention and working memory. Oral phase c/b generalized lingual weakness resulting in lingual pumping at times, bolus holding, oral residuals along L lateral sulci, reduced posterior propulsion of bolus, premature spillage of bolus, and decreased bolus cohesion. Pharyngeal phase c/b reduced base of tongue approximation to posterior pharyngeal wall, decreased pharyngeal peristalsis, minimal reduction in laryngeal closure, and mild-moderate vallecular and minimal to mild pyriform sinus and lateral channel stasis post-swallow. Aforementioned deficits resulted in inconsistent instances of shallow, trace-transient penetration (PAS 2) with consumption of thin liquid via cup and straw. Attempted compensatory strategies to include bolus hold and effortful swallow with no evidence of change to swallow function nor safety. No evidence of penetration nor aspiration with puree or mechanical soft textures. Cognitive deficits were limiting; therefore, pt required Max multimodal cues to maintain upright/neutral head position, initiate swallow, and perform secondary, dry swallow to clear pharyngeal stasis. No evidence of esophageal etiology to pt's dysphagia sx's. Given instrumental findings, recommend continuation of Dysphagia 2 textures;  however, may upgrade to thin liquids with adherence to general aspiration precautions + secondary swallow and full supervision for verbal cueing to maintain attention and  implement precautions. Results and recommendations were briefly reviewed with pt, though he will need reinforcement and wife will need education. Will plan to see later this date for further education. May upgrade solid textures clinically, as pt becomes more aware of oral cavity and residuals. Recommend initiation of base of tongue exercises, effortful swallow, and oral motor exercises to improve lingual ROM and oral awareness for clearing residuals. SLP Visit Diagnosis Cognitive communication deficit (R41.841);Dysphagia, oropharyngeal phase (R13.12) Impact on safety and function Mild aspiration risk     12/04/2021   2:00 PM Treatment Recommendations Treatment Recommendations Therapy as outlined in treatment plan below     12/22/2021  10:02 AM Prognosis Prognosis for Safe Diet Advancement Good Barriers to Reach Goals Cognitive deficits   12/22/2021   9:58 AM Diet Recommendations SLP Diet Recommendations Dysphagia 2 (Fine chop) solids;Thin liquid Liquid Administration via Straw;Cup Medication Administration Crushed with puree Compensations Minimize environmental distractions;Slow rate;Small sips/bites;Lingual sweep for clearance of pocketing;Follow solids with liquid;Multiple dry swallows after each bite/sip Postural Changes Remain semi-upright after after feeds/meals (Comment);Seated upright at 90 degrees     12/22/2021   9:58 AM Other Recommendations Oral Care Recommendations Oral care before and after PO;Staff/trained caregiver to provide oral care   12/05/2021  11:00 AM Frequency and Duration  Speech Therapy Frequency (ACUTE ONLY) min 2x/week     12/22/2021   9:51 AM Oral Phase Oral Phase Impaired Oral - Honey Teaspoon NT Oral - Honey Cup NT Oral - Nectar Teaspoon NT Oral - Nectar Cup NT Oral - Nectar Straw NT Oral - Thin Teaspoon Holding of bolus;Weak lingual manipulation;Delayed oral transit;Decreased bolus cohesion;Premature spillage Oral - Thin Cup Holding of bolus;Weak lingual manipulation;Delayed oral  transit;Decreased bolus cohesion;Premature spillage Oral - Thin Straw Weak lingual manipulation;Holding of bolus;Delayed oral transit;Decreased bolus cohesion;Premature spillage Oral - Puree Lingual pumping;Weak lingual manipulation;Reduced posterior propulsion;Delayed oral transit;Decreased bolus cohesion;Premature spillage Oral - Mech Soft Impaired mastication;Left pocketing in lateral sulci;Piecemeal swallowing;Delayed oral transit;Decreased bolus cohesion Oral - Regular NT Oral - Multi-Consistency NT Oral - Pill NT    12/22/2021   9:53 AM Pharyngeal Phase Pharyngeal Phase Impaired Pharyngeal- Honey Teaspoon NT Pharyngeal- Honey Cup NT Pharyngeal- Nectar Teaspoon NT Pharyngeal- Nectar Cup NT Pharyngeal- Nectar Straw NT Pharyngeal- Thin Teaspoon Delayed swallow initiation-pyriform sinuses;Reduced pharyngeal peristalsis;Reduced tongue base retraction;Pharyngeal residue - valleculae;Pharyngeal residue - pyriform Pharyngeal Material does not enter airway Pharyngeal- Thin Cup Delayed swallow initiation-pyriform sinuses;Reduced pharyngeal peristalsis;Reduced airway/laryngeal closure;Reduced tongue base retraction;Penetration/Aspiration during swallow;Pharyngeal residue - valleculae;Pharyngeal residue - pyriform;Lateral channel residue;Compensatory strategies attempted (with notebox) Pharyngeal Material enters airway, remains ABOVE vocal cords then ejected out Pharyngeal- Thin Straw Delayed swallow initiation-pyriform sinuses;Reduced pharyngeal peristalsis;Reduced airway/laryngeal closure;Penetration/Aspiration during swallow;Pharyngeal residue - valleculae;Pharyngeal residue - pyriform;Lateral channel residue;Compensatory strategies attempted (with notebox) Pharyngeal Material enters airway, remains ABOVE vocal cords then ejected out Pharyngeal- Puree Reduced tongue base retraction;Pharyngeal residue - valleculae Pharyngeal Material does not enter airway Pharyngeal- Mechanical Soft Reduced tongue base  retraction;Pharyngeal residue - valleculae Pharyngeal Material does not enter airway Pharyngeal- Regular NT Pharyngeal- Multi-consistency NT Pharyngeal- Pill NT    12/22/2021   9:58 AM Cervical Esophageal Phase  Cervical Esophageal

## 2021-12-23 NOTE — Progress Notes (Signed)
Speech Language Pathology Daily Session Note ? ?Patient Details  ?Name: Patrick Rose ?MRN: [id number] ?Date of Birth: 01-27-1951 ? ?Today's Date: 12/23/2021 ?SLP Individual Time: 9030-0923 ?SLP Individual Time Calculation (min): 58 min ? ?Short Term Goals: ?Week 3: SLP Short Term Goal 1 (Week 3): Pt will participate in MBSS to assess swallow function/physiology and determine least restrictive diet with 100% completion. ?SLP Short Term Goal 1 - Progress (Week 3): Met ?SLP Short Term Goal 2 (Week 3): Pt will consume recommended diet textures with s/sx concerning for aspiration <25% of the time and Min A to implement safe swallow strategies. ?SLP Short Term Goal 3 (Week 3): Pt will utilize speech intelligibility strategies within the context of conversation with Supervision A. ?SLP Short Term Goal 4 (Week 3): Pt will complete functional + basic problem-solving tasks with 80% accuracy given Min A. ?SLP Short Term Goal 5 (Week 3): Pt will attend to L visual field to complete table top tasks with 80% accuracy given visual anchor and Supervision question A. ?SLP Short Term Goal 6 (Week 3): Pt will recall 2-3 activties from ST session given Supervision question A. ? ?Skilled Therapeutic Interventions: ?  Pt seen this AM for skilled ST services targeting dysphagia. SLP assisted pt with obtaining more neutral body positioning (as pt was leaning to R) prior to completing oral care. After completion of oral care, SLP initiated breakfast trial tray of dys 2 and thin liquids as requested by primary SLP. Pt required modA verbal cueing for use of lingual sweep to clear L buccal residue. Pt used swish and swallow in attempt to clear residues; however, this was not successful. With bite of puree, pt was able to clear oral cavity residue more effectively. No overt s/sx of aspiration were observed throughout today's meal; however, pt did require occasional cues to maintain focus throughout meal. Pt reported he was fatigued and did not  want to continue breakfast. Wife was in room and completed oral care after completion of meal. SLP updated diet order for upgrade to thin liquid with continuation of dys2 diet. Pt was left in room, all immediate needs within reach, and bed alarm activated. Continue with current POC.  ? ?Pain ?Pain Assessment ?Pain Score: 0-No pain ?Therapy/Group: Individual Therapy ? ?Royal ?12/23/2021, 1:26 PM ?

## 2021-12-24 DIAGNOSIS — I63511 Cerebral infarction due to unspecified occlusion or stenosis of right middle cerebral artery: Secondary | ICD-10-CM | POA: Diagnosis not present

## 2021-12-24 MED ORDER — AMANTADINE HCL 100 MG PO CAPS
100.0000 mg | ORAL_CAPSULE | Freq: Every day | ORAL | Status: DC
  Administered 2021-12-24 – 2022-01-05 (×13): 100 mg via ORAL
  Filled 2021-12-24 (×13): qty 1

## 2021-12-24 NOTE — Progress Notes (Signed)
Speech Language Pathology Daily Session Note ? ?Patient Details  ?Name: Patrick Rose ?MRN: [id number] ?Date of Birth: Aug 07, 1951 ? ?Today's Date: 12/24/2021 ?SLP Individual Time: 0905-1000 ?SLP Individual Time Calculation (min): 55 min ? ?Short Term Goals: ?Week 3: SLP Short Term Goal 1 (Week 3): Pt will participate in MBSS to assess swallow function/physiology and determine least restrictive diet with 100% completion. ?SLP Short Term Goal 1 - Progress (Week 3): Met ?SLP Short Term Goal 2 (Week 3): Pt will consume recommended diet textures with s/sx concerning for aspiration <25% of the time and Min A to implement safe swallow strategies. ?SLP Short Term Goal 3 (Week 3): Pt will utilize speech intelligibility strategies within the context of conversation with Supervision A. ?SLP Short Term Goal 4 (Week 3): Pt will complete functional + basic problem-solving tasks with 80% accuracy given Min A. ?SLP Short Term Goal 5 (Week 3): Pt will attend to L visual field to complete table top tasks with 80% accuracy given visual anchor and Supervision question A. ?SLP Short Term Goal 6 (Week 3): Pt will recall 2-3 activties from ST session given Supervision question A. ? ?Skilled Therapeutic Interventions: Skilled treatment session focused on cognitive goals. Upon arrival, patient was consuming his breakfast meal of Dys. 2 textures with thin liquids. Patient with significant lean to the right while upright in the bed and required frequent repositioning. SLP facilitated session by providing overall Mod A verbal cues for patient to sustain attention to self-feeding and to decrease verbosity. No overt s/s of aspiration noted during meal. After the meal, patient completed oral care in which a large amount of residuals were expectorated. SLP also facilitated session by providing overall Min verbal cues for sustain attention to a basic money management task. [name] overall attention appeared to improve when SLP implemented time  constraints throughout task. [name] overall problem solving appeared intact as patient was able to self-correct errors when questioned. Min verbal cues were also needed for left visual scanning throughout task. Patient was left upright in bed with alarm on and all needs within reach. Continue with current plan of care.  ?   ? ?Pain ?No/Denies Pain ? ?Therapy/Group: Individual Therapy ? ?Lianne Carreto ?12/24/2021, 2:24 PM ?

## 2021-12-24 NOTE — Progress Notes (Signed)
Pt slept throughout the night. Pt had incontinence of urine and wore male pure wick. Output of recorded in suction. Pt denies any pain, scheduled tramadol given a.m.  ?

## 2021-12-24 NOTE — Progress Notes (Signed)
Occupational Therapy Session Note ? ?Patient Details  ?Name: Patrick Rose ?MRN: [id number] ?Date of Birth: 1951-06-18 ? ?Today's Date: 12/24/2021 ?OT Individual Time: 1100-1200 ?OT Individual Time Calculation (min): 60 min  ? ? ?Short Term Goals: ?Week 2:  OT Short Term Goal 1 (Week 2): Pt will sit EOB with no more than mod A with max cueing for focused attention ?OT Short Term Goal 1 - Progress (Week 2): Met ?OT Short Term Goal 2 (Week 2): Pt will don a shirt with max A ?OT Short Term Goal 2 - Progress (Week 2): Met ?OT Short Term Goal 3 (Week 2): Pt will demonstrated sustained attention to UB dressing or bathing task with max cueing ?OT Short Term Goal 3 - Progress (Week 2): Progressing toward goal ?OT Short Term Goal 4 (Week 2): [name] caregiver will demonstrate competency with LUE PROM HEP ?OT Short Term Goal 4 - Progress (Week 2): Met ?Week 3:  OT Short Term Goal 1 (Week 3): Pt will sit EOB with mod A without UE support ?OT Short Term Goal 2 (Week 3): Pt will don a shirt with assist only to thread LUE ?OT Short Term Goal 3 (Week 3): Pt will attend to simple grooming task with no more than mod cueing ?OT Short Term Goal 4 (Week 3): Pt will complete transfer with LRAD to the w/c with max A of 1 ? ?Skilled Therapeutic Interventions/Progress Updates:  ?  1:1 Self care retraining. Pt received in the bed. Focused on bed mobilitiy and LB activiation to assist with dressing LB. Pt can roll to the right with max A and to the left with min A. Pt came to EOB with mod A +2; required mod A to maintain sitting balance at midline. Squat pivot to the w/c on his right with mod  +2. Pt able to doff shirt with supervision with cues and then donned with proper set up and max cues for sequencing and follow through. Pt with difficulty with leaning forward (to allow for pulling down his shirt). ? ? Pt taken to the gym via w/c. Neuro- muscular reed with focus on sitting balance, postural control, balance reactions, visual attention to  task, following one to two step directions, sustained attention to task, transitional movements, and components to transfers. Pt transferred with max A +2 to the mat towards his right with max multimodal cues. Pt positioned on the end of mat with his right side up against the wall/window as a cue to weight shift towards to correct balance and locate and maintain midline. Used a Geologist, engineering for Warden/ranger. Addressed posture with functional reaching and then ability to return to proper sitting balance position. Then transitioned to sit to squat with attempts for decr pushing with max +2 and progressed to standing with weight shifts to the right. Still with +2 for safety with max A. ? ?Left sitting up in the TIS w/c with wife present and safety belt donned.  ? ?Therapy Documentation ?Precautions:  ?Precautions ?Precautions: Fall ?Precaution Comments: dense L hemipareisis with UE/LE tone, pushes to L, limited focus ?Restrictions ?Weight Bearing Restrictions: No ?General: ?  ?Vital Signs: ?Therapy Vitals ?Temp: (!) 97.5 ?F (36.4 ?C) ?Temp Source: Axillary ?Pulse Rate: 77 ?Resp: 18 ?BP: 126/63 ?Patient Position (if appropriate): Sitting ?Oxygen Therapy ?SpO2: 99 % ?O2 Device: Room Air ?Pain: ? With some positioning pt reports pain and spasms in the left hip  ? ? ?Therapy/Group: Individual Therapy ? ?Nicoletta Ba ?12/24/2021, 3:08 PM ?

## 2021-12-24 NOTE — Progress Notes (Signed)
Physical Therapy Session Note ? ?Patient Details  ?Name: Patrick Rose ?MRN: [id number] ?Date of Birth: 04/11/1951 ? ?Today's Date: 12/24/2021 ?PT Individual Time: [id number] ?PT Individual Time Calculation (min): 75 min  ? ?Short Term Goals: ?Week 3:  PT Short Term Goal 1 (Week 3): Patient will perform sitting balance with supervision >2 min with mod cues for midline orientation. ?PT Short Term Goal 2 (Week 3): Patient will perform chair<>chair transfers with mod A +2. ?PT Short Term Goal 3 (Week 3): Pt will perform sit<>stand with Max A using LRAD. ?PT Short Term Goal 4 (Week 3): Patient will perform bed mobility with mod A >50% of the time with use of hospital bed features. ? ?Skilled Therapeutic Interventions/Progress Updates:  ?   ?Patient in TIS w/c upon PT arrival. Patient alert and agreeable to PT session. Patient reported intermittent L hip pain with onset of spasticity with mobility during session, RN made aware. PT provided repositioning, rest breaks, and distraction as pain interventions throughout session.  ? ?Noted mild improvement in attention this session.  ? ?Therapeutic Activity: ?Bed Mobility: Patient performed sit to supine with max-mod A. Provided verbal cues for initiation, lower extremity management, and attention to task. ?Transfers: Patient performed sit to/from stand x5 with mod-max A +2. Provided multimodal cues for forward trunk lean and weight shift, hand placement, initiation, and hip/knee/trunk extension. Blocked L knee and facilitated knee extension for minimal flexion or adduction activation and increased hip/knee extension. ?Patient incontinent of bladder at beginning of session performed stand to switch out TIS w/c for [facility] with third person assist. Patient performed anterior peri-care with cues for attention to L side. Upon first stand and posterior peri-care, patient began to have a BM and was returned to the [facility] and provided time for BM while working on midline orientation with  multimodal cues. Patient able to look R and state he was out of midline then correct to midline with min A for initiation of trunk motion, recalled target cue for his R elbow to correct L trunk lean from earlier session, and responded well to light facilitation at the  L side of his head only to maintain midline >60 sec! ?Patient performed a lateral scoot/squat pivot  transfer with max a of 1 person with second person SBA and intermittent min facilitation for trunk flexion and third person to stabilize his R hand to prevent pushing. ? ?Neuromuscular Re-ed: ?Therapeutic Gait training: ?Patient ambulated 23 feet and 78 feet limited by decreased attention to task leading to poor trunk control and initiation of stepping. Utilized 3 musketeer technique with max A +2 and total-max A L limb advancement with +3 for w/c follow. Improved midline orientation and posture with continues L bias and notable hip flexion and hamstring activation to initiate L limb advancement, required max-total A for L knee extension during advancement and stance. ?Performed midline cues, as above in sitting during seated rest breaks between trials. ? ?Patient in bed with his wife at bedside at end of session with breaks locked, bed alarm set, and all needs within reach. Instructed patient to lie flat in supine x30 min with L PRAFO donned to stretch and utilize benefits of standing and gait training for tone management. Patient in agreement.  ? ?Therapy Documentation ?Precautions:  ?Precautions ?Precautions: Fall ?Precaution Comments: dense L hemipareisis with UE/LE tone, pushes to L, limited focus ?Restrictions ?Weight Bearing Restrictions: No ? ? ? ?Therapy/Group: Individual Therapy ? ?Dimitrius Steedman L Elenora Hawbaker PT, DPT, CBIS ? ?12/24/2021, 5:25 PM  ?

## 2021-12-24 NOTE — Progress Notes (Signed)
?                                                       PROGRESS NOTE ? ? ?Subjective/Complaints: ? ?Pt reports slept OK- but leaning a lot- AJ was his crutch yesterday.  ?meds didn't help attention".  ? ?More awake, pt admits this AM.  ? ?ROS:  ?Limited by cognition ? ?Objective: ?  ?DG Swallowing Func-Speech Pathology ? ?Result Date: 12/22/2021 ?Table formatting from the original result was not included. Objective Swallowing Evaluation: Type of Study: MBS-Modified Barium Swallow Study  Patient Details Name: Patrick Rose MRN: [id number] Date of Birth: 1950/10/11 Today's Date: 12/22/2021 Time: SLP Start Time (ACUTE ONLY): 0909 -SLP Stop Time (ACUTE ONLY): 0942 SLP Time Calculation (min) (ACUTE ONLY): 33 min Past Medical History: Past Medical History: Diagnosis Date  Afib ([address])   Cervical myelopathy (HCC)   Lumbar stenosis 09/2021  with pain radiatiing down buttocks to thighs Past Surgical History: Past Surgical History: Procedure Laterality Date  ANTERIOR CERVICAL DECOMP/DISCECTOMY FUSION  2008  APPENDECTOMY    BACK SURGERY    IR CT HEAD LTD  12/03/2021  IR PERCUTANEOUS ART THROMBECTOMY/INFUSION INTRACRANIAL INC DIAG ANGIO  12/03/2021  RADIOLOGY WITH ANESTHESIA N/A 12/03/2021  Procedure: IR WITH ANESTHESIA;  Surgeon: Luanne Bras, MD;  Location: [place];  Service: Radiology;  Laterality: N/A; HPI: 71 year old man who presented to [facility] ED 3/30 as a Code Stroke due to L hemiplegia and R gaze deviation. LKW 1700 3/30. PMHx significant Afib (CHA2DS2-VASc 1, not on AC), aortic aneurysm (stable) admitted with acute stroke s/p TNK and mechanical thrombectomy of right MCA m1 with TICI 3.  Subjective: alert, participatory  Recommendations for follow up therapy are one component of a multi-disciplinary discharge planning process, led by the attending physician.  Recommendations may be updated based on patient status, additional functional criteria and insurance authorization. Assessment / Plan / Recommendation   12/22/2021    9:58 AM Clinical Impressions Clinical Impression Pt seen this date for instrumental swallow study (MBSS) secondary to s/sx c/f pharyngeal dysphagia at bedside, and to assess readiness for upgrading liquid consistencies. Per instrumental findings, pt presents with mild-moderate oral and mild pharyngeal dysphagia in the setting of acute R CVA resulting in moderate-severe cognitive impairment and dysarthria. Please note, performance likely compounded by cognitive deficits to include poor focused attention and working memory. Oral phase c/b generalized lingual weakness resulting in lingual pumping at times, bolus holding, oral residuals along L lateral sulci, reduced posterior propulsion of bolus, premature spillage of bolus, and decreased bolus cohesion. Pharyngeal phase c/b reduced base of tongue approximation to posterior pharyngeal wall, decreased pharyngeal peristalsis, minimal reduction in laryngeal closure, and mild-moderate vallecular and minimal to mild pyriform sinus and lateral channel stasis post-swallow. Aforementioned deficits resulted in inconsistent instances of shallow, trace-transient penetration (PAS 2) with consumption of thin liquid via cup and straw. Attempted compensatory strategies to include bolus hold and effortful swallow with no evidence of change to swallow function nor safety. No evidence of penetration nor aspiration with puree or mechanical soft textures. Cognitive deficits were limiting; therefore, pt required Max multimodal cues to maintain upright/neutral head position, initiate swallow, and perform secondary, dry swallow to clear pharyngeal stasis. No evidence of esophageal etiology to pt's dysphagia sx's. Given instrumental findings, recommend continuation of Dysphagia 2 textures; however, may  upgrade to thin liquids with adherence to general aspiration precautions + secondary swallow and full supervision for verbal cueing to maintain attention and implement precautions. Results and  recommendations were briefly reviewed with pt, though he will need reinforcement and wife will need education. Will plan to see later this date for further education. May upgrade solid textures clinically, as pt becomes more aware of oral cavity and residuals. Recommend initiation of base of tongue exercises, effortful swallow, and oral motor exercises to improve lingual ROM and oral awareness for clearing residuals. SLP Visit Diagnosis Cognitive communication deficit (R41.841);Dysphagia, oropharyngeal phase (R13.12) Impact on safety and function Mild aspiration risk     12/04/2021   2:00 PM Treatment Recommendations Treatment Recommendations Therapy as outlined in treatment plan below     12/22/2021  10:02 AM Prognosis Prognosis for Safe Diet Advancement Good Barriers to Reach Goals Cognitive deficits   12/22/2021   9:58 AM Diet Recommendations SLP Diet Recommendations Dysphagia 2 (Fine chop) solids;Thin liquid Liquid Administration via Straw;Cup Medication Administration Crushed with puree Compensations Minimize environmental distractions;Slow rate;Small sips/bites;Lingual sweep for clearance of pocketing;Follow solids with liquid;Multiple dry swallows after each bite/sip Postural Changes Remain semi-upright after after feeds/meals (Comment);Seated upright at 90 degrees     12/22/2021   9:58 AM Other Recommendations Oral Care Recommendations Oral care before and after PO;Staff/trained caregiver to provide oral care   12/05/2021  11:00 AM Frequency and Duration  Speech Therapy Frequency (ACUTE ONLY) min 2x/week     12/22/2021   9:51 AM Oral Phase Oral Phase Impaired Oral - Honey Teaspoon NT Oral - Honey Cup NT Oral - Nectar Teaspoon NT Oral - Nectar Cup NT Oral - Nectar Straw NT Oral - Thin Teaspoon Holding of bolus;Weak lingual manipulation;Delayed oral transit;Decreased bolus cohesion;Premature spillage Oral - Thin Cup Holding of bolus;Weak lingual manipulation;Delayed oral transit;Decreased bolus cohesion;Premature  spillage Oral - Thin Straw Weak lingual manipulation;Holding of bolus;Delayed oral transit;Decreased bolus cohesion;Premature spillage Oral - Puree Lingual pumping;Weak lingual manipulation;Reduced posterior propulsion;Delayed oral transit;Decreased bolus cohesion;Premature spillage Oral - Mech Soft Impaired mastication;Left pocketing in lateral sulci;Piecemeal swallowing;Delayed oral transit;Decreased bolus cohesion Oral - Regular NT Oral - Multi-Consistency NT Oral - Pill NT    12/22/2021   9:53 AM Pharyngeal Phase Pharyngeal Phase Impaired Pharyngeal- Honey Teaspoon NT Pharyngeal- Honey Cup NT Pharyngeal- Nectar Teaspoon NT Pharyngeal- Nectar Cup NT Pharyngeal- Nectar Straw NT Pharyngeal- Thin Teaspoon Delayed swallow initiation-pyriform sinuses;Reduced pharyngeal peristalsis;Reduced tongue base retraction;Pharyngeal residue - valleculae;Pharyngeal residue - pyriform Pharyngeal Material does not enter airway Pharyngeal- Thin Cup Delayed swallow initiation-pyriform sinuses;Reduced pharyngeal peristalsis;Reduced airway/laryngeal closure;Reduced tongue base retraction;Penetration/Aspiration during swallow;Pharyngeal residue - valleculae;Pharyngeal residue - pyriform;Lateral channel residue;Compensatory strategies attempted (with notebox) Pharyngeal Material enters airway, remains ABOVE vocal cords then ejected out Pharyngeal- Thin Straw Delayed swallow initiation-pyriform sinuses;Reduced pharyngeal peristalsis;Reduced airway/laryngeal closure;Penetration/Aspiration during swallow;Pharyngeal residue - valleculae;Pharyngeal residue - pyriform;Lateral channel residue;Compensatory strategies attempted (with notebox) Pharyngeal Material enters airway, remains ABOVE vocal cords then ejected out Pharyngeal- Puree Reduced tongue base retraction;Pharyngeal residue - valleculae Pharyngeal Material does not enter airway Pharyngeal- Mechanical Soft Reduced tongue base retraction;Pharyngeal residue - valleculae Pharyngeal  Material does not enter airway Pharyngeal- Regular NT Pharyngeal- Multi-consistency NT Pharyngeal- Pill NT    12/22/2021   9:58 AM Cervical Esophageal Phase  Cervical Esophageal Phase [facility] 4/18/2

## 2021-12-25 DIAGNOSIS — I63511 Cerebral infarction due to unspecified occlusion or stenosis of right middle cerebral artery: Secondary | ICD-10-CM | POA: Diagnosis not present

## 2021-12-25 DIAGNOSIS — L8990 Pressure ulcer of unspecified site, unspecified stage: Secondary | ICD-10-CM | POA: Insufficient documentation

## 2021-12-25 MED ORDER — BISACODYL 10 MG RE SUPP
10.0000 mg | Freq: Every day | RECTAL | Status: DC
  Administered 2021-12-25 – 2021-12-29 (×4): 10 mg via RECTAL
  Filled 2021-12-25 (×4): qty 1

## 2021-12-25 MED ORDER — METHYLPHENIDATE HCL 5 MG PO TABS
10.0000 mg | ORAL_TABLET | Freq: Two times a day (BID) | ORAL | Status: DC
Start: 2021-12-26 — End: 2021-12-29
  Administered 2021-12-26 – 2021-12-29 (×8): 10 mg via ORAL
  Filled 2021-12-25 (×8): qty 2

## 2021-12-25 NOTE — Progress Notes (Signed)
Speech Language Pathology Daily Session Note ? ?Patient Details  ?Name: Patrick Rose ?MRN: [id number] ?Date of Birth: November 26, 1950 ? ?Today's Date: 12/25/2021 ?SLP Individual Time: 5051-8335 ?SLP Individual Time Calculation (min): 55 min ? ?Short Term Goals: ?Week 3: SLP Short Term Goal 1 (Week 3): Pt will participate in MBSS to assess swallow function/physiology and determine least restrictive diet with 100% completion. ?SLP Short Term Goal 1 - Progress (Week 3): Met ?SLP Short Term Goal 2 (Week 3): Pt will consume recommended diet textures with s/sx concerning for aspiration <25% of the time and Min A to implement safe swallow strategies. ?SLP Short Term Goal 3 (Week 3): Pt will utilize speech intelligibility strategies within the context of conversation with Supervision A. ?SLP Short Term Goal 4 (Week 3): Pt will complete functional + basic problem-solving tasks with 80% accuracy given Min A. ?SLP Short Term Goal 5 (Week 3): Pt will attend to L visual field to complete table top tasks with 80% accuracy given visual anchor and Supervision question A. ?SLP Short Term Goal 6 (Week 3): Pt will recall 2-3 activties from ST session given Supervision question A. ? ?Skilled Therapeutic Interventions: Skilled treatment session focused on dysphagia and cognitive goals. SLP facilitated session by providing Min verbal cues for arousal. Patient repositioned in bed in order to consume breakfast meal safely. Patient consumed his breakfast meal of Dys. 2 textures with thin liquids with Mod-Max A verbal cues needed to self-monitor and correct left buccal pocketing of Dys. 2 textures. Patient with minimally reduced verbosity today but continued to require cues for attention to mastication of bolus. Intermittent subtle cough noted X 2 throughout meal, suspect due to mixed consistencies and decreased awareness of bolus. Recommend patient continue current diet. Despite Max cueing and more than a reasonable amount of time, patient was  unable to complete his meal within the session. Patient handed off to nursing. Patient left upright in bed with alarm on and all needs within reach. Continue with current plan of care.  ?   ? ?Pain ?Pain Assessment ?Pain Scale: 0-10 ?Pain Score: 0-No pain ? ?Therapy/Group: Individual Therapy ? ?Gale Klar ?12/25/2021, 12:27 PM ?

## 2021-12-25 NOTE — Progress Notes (Signed)
Patient ID: Patrick Rose, male   DOB: 1950-10-08, 71 y.o.   MRN: [id number] ? ?SW followed up with pt wife to discuss SNF bed offers. Would like for SW to follow-up within [place] and [facility] as well. ? ?SW left message for [facility]/admissions [facility] (covering for [facility]) to discuss referral and waiting on follow-up.  ? ?SW waiting on updates from [employer] about referral.  ?*declined as unable to accept insurance.  ? ?Cecile Sheerer, MSW, LCSWA ?Office: [phone] ?Cell: [phone] ?Fax: [phone]  ?

## 2021-12-25 NOTE — Progress Notes (Signed)
Occupational Therapy Session Note ? ?Patient Details  ?Name: Patrick Rose ?MRN: [id number] ?Date of Birth: [birth year] ? ?Today's Date: 12/25/2021 ?OT Individual Time: 1400-1505 ?OT Individual Time Calculation (min): 65 min  ? ? ?Short Term Goals: ?Week 3:  OT Short Term Goal 1 (Week 3): Pt will sit EOB with mod A without UE support ?OT Short Term Goal 2 (Week 3): Pt will don a shirt with assist only to thread LUE ?OT Short Term Goal 3 (Week 3): Pt will attend to simple grooming task with no more than mod cueing ?OT Short Term Goal 4 (Week 3): Pt will complete transfer with LRAD to the w/c with max A of 1 ? ?Skilled Therapeutic Interventions/Progress Updates:  ?  Pt received sitting up in the TIS w/c with no c/o pain. He was taken to the therapy gym via w/c. Skilled +2 with second OT present for session. Session focused on postural control, midline awareness and orientation in static and dynamic sitting and standing balance. Huge improvement in sitting balance today- pt able to maintain statically with (S) for several minutes, occasional mod when he lost balance posteriorly. Dynamically when reaching out of BOS he required min-mod A to right his trunk back to midline. He completed standing with the Maxi Sky sling donned for safety with functional reaching to the R to encourage elongation of the L trunk and weightshifting R and L. Mod facilitation distally at the L knee. He had much better attention today as well, requiring only mod cueing for sustaining to task for 30 sec-1 min. He was returned to his room and NT alerted to need to get pt back to bed after his wife wheeled him around the unit for brief change. TIS tilted back for safety.  ? ?Saebo Stim One was placed on his L tricep for muscle activation and to promote visual attention to the LUE. He had no reports of pain or adverse skin reactions.  ?330 pulse width ?35 Hz pulse rate ?On 8 sec/ off 8 sec ?Ramp up/ down 2 sec ?Symmetrical Biphasic wave form  ?Max  intensity at 500 Ohm load ? ?Therapy Documentation ?Precautions:  ?Precautions ?Precautions: Fall ?Precaution Comments: dense L hemipareisis with UE/LE tone, pushes to L, limited focus ?Restrictions ?Weight Bearing Restrictions: No ? ?Therapy/Group: Individual Therapy ? ?Crissie Reese ?12/25/2021, 6:23 AM ?

## 2021-12-25 NOTE — Progress Notes (Signed)
Physical Therapy Session Note ? ?Patient Details  ?Name: Patrick Rose ?MRN: [id number] ?Date of Birth: 08/06/1951 ? ?Today's Date: 12/25/2021 ?PT Individual Time: 4166-0630 ?PT Individual Time Calculation (min): 75 min  ? ?Short Term Goals: ?Week 1:  PT Short Term Goal 1 (Week 1): Pt will perform bed mobility with consistent MaxA and imrproved ability to follow instructions. ?PT Short Term Goal 1 - Progress (Week 1): Progressing toward goal ?PT Short Term Goal 2 (Week 1): Pt will perform sit<>stand with consistent Mod/ Max +2. ?PT Short Term Goal 2 - Progress (Week 1): Met ?PT Short Term Goal 3 (Week 1): Pt will perform seat to seat transfers with consistent MaxA +2 ?PT Short Term Goal 3 - Progress (Week 1): Progressing toward goal ?PT Short Term Goal 4 (Week 1): Pt will demonstrate improved balance with decreased pushing to L side to <50%. ?PT Short Term Goal 4 - Progress (Week 1): Met ? ?Skilled Therapeutic Interventions/Progress Updates:  ?   ? ?Pt presents supine in bed to start session - completed with co-tx PT for additional skilled facilitation during functional mobility tasks. Pt with saturated brief on arrival, also connected to male purewick - instructed pt to attempt to urinate prior to disconnecting purewick and time provided for this but pt unable. Disconnected purewick and then began rolling in bed to remove dirty brief - donned new brief with totalA for time. Rolling in bed with maxA. Once new clean brief, pt incontinent of urine and required 2nd brief change and assist for pericare. Donned pants at bed level via bridging technique to engage gluts and functional dressing - limited hip clearance during bridge technique but adequate to pull pants over hips. Donned socks/shoes with totalA for time. Supine<>sitting EOB with HOB flat, use of bed rails, required maxA with +2 assist on standby. Required max multi-modal cueing for midline awareness and postural control in unsupported sitting, balance  fluctuating b/w min to maxA depending on LOB and L lean. SB transfer with +2 maxA from EOB to TIS w/c, pt with limited engagement in LE's and relying primarily on his UE 's to assist with pushing hips to chair. Transported to [facility] rehab gym for time management and assisted to mat table in similar SB transfer method. Focused remainder of session on sitting balance, midline awareness, postural control. Completed Function in Sitting Test (FIST) with results described below. After testing, incorporated parts of the test for intervention. Used several strategies to assist with sitting balance including visual scanning, tactile feedback - pt with some pelvic obliquity in sitting as well as extension on RLE causing poor sitting balance. Assisted back to his w/c via +2 maxA SB transfer and returned to room where he remained seated and reclined in TIS w/c, safety belt alarm on, call bell in lap, all needs in reach.  ? ?Therapy Documentation ?Precautions:  ?Precautions ?Precautions: Fall ?Precaution Comments: dense L hemipareisis with UE/LE tone, pushes to L, limited focus ?Restrictions ?Weight Bearing Restrictions: No ?General: ? ? ?Balance: ?Standardized Balance Assessment ?Standardized Balance Assessment: FIST ?Function in Sitting Test = FIST ?Anterior Nudge: superior sternum: Needs assistance ?Anterior Nudge Assistance: Max Assist ?Posterior Nudge: between scapular spines: Needs assistance ?Posterior Nudge Assistance: Min Assist ?Lateral Nudge: to dominant side at acromion: Needs assistance ?Lateral Nudge Assistance: Max Assist ?Static Sitting: 30 seconds: Needs assistance ?Static Sitting Assistance: [place] ?Sitting, Shake "No": left and right: Needs assistance ?Sitting, Shake "No"  Assistance: Min Assist ?Sitting, Eyes Closed: 30 seconds: Needs assistance ?Sitting, Eyes Closed  Assistance: Min Assist ?Sitting, Lift Foot: dominant side, lift foot 1 inch twice: Needs assistance ?Sitting, Lift Foot Assistance: Mod  Assist ?Pick Up Object From Behind: object at midline, hands breadth posterior: Needs assistance ?Pick Up Object From Behind Assistance: Max Assist ?Forward Reach: use dominant arm, must complete full motion: Needs assistance ?Forward Reach Assistance: Mod Assist ?Lateral Reach: use dominant arm, clear opposite ischial tuberosity: Needs assistance ?Lateral Reach Assistance: Max Assist ?Pick Up Object From Floor: from between feet: Needs assistance ?Pick Up Object From Floor  Assistance: Max Assist ?Posterior Scooting: move backwards 2 inches: Needs assistance ?Posterior Scooting Assistance: Max Assist ?Anterior Scooting: move forward 2 inches: Needs assistance ?Anterior Scooting Assistance: Max Assist ?Lateral Scooting: move to dominent side 2 inches: Needs assistance ?Lateral Scooting Assistance: Max Assist ?FIST TOTAL SCORE: 14/56 ?  ? ?Therapy/Group: Individual Therapy ? ?Alger Simons PT ?12/25/2021, 12:19 PM  ?

## 2021-12-25 NOTE — Progress Notes (Signed)
?                                                       PROGRESS NOTE ? ? ?Subjective/Complaints: ? ?Pt reports has "raw spot on butt" and dressing placed.  ?Per PT< wants bowel program- pt incontinent so frequently, really impacts therapy.  ? ?Will start.  ?Pt admits to being a little more awake this AM.  ? ?ROS:  ?Limited by cognition ? ?Objective: ?  ?No results found. ?No results for input(s): WBC, HGB, HCT, PLT in the last 72 hours. ? ?No results for input(s): NA, K, CL, CO2, GLUCOSE, BUN, CREATININE, CALCIUM in the last 72 hours. ? ? ?Intake/Output Summary (Last 24 hours) at 12/25/2021 1524 ?Last data filed at 12/25/2021 1306 ?Gross per 24 hour  ?Intake 820 ml  ?Output --  ?Net 820 ml  ?  ? ?Pressure Injury 12/25/21 Coccyx Mid Stage 1 -  Intact skin with non-blanchable redness of a localized area usually over a bony prominence. redness (Active)  ?12/25/21 0507  ?Location: Coccyx  ?Location Orientation: Mid  ?Staging: Stage 1 -  Intact skin with non-blanchable redness of a localized area usually over a bony prominence.  ?Wound Description (Comments): redness  ?Present on Admission: No  ?   ?Pressure Injury 12/25/21 Buttocks Left;Lower Stage 1 -  Intact skin with non-blanchable redness of a localized area usually over a bony prominence. red (Active)  ?12/25/21 0508  ?Location: Buttocks  ?Location Orientation: Left;Lower  ?Staging: Stage 1 -  Intact skin with non-blanchable redness of a localized area usually over a bony prominence.  ?Wound Description (Comments): red  ?Present on Admission: No  ? ? ?Physical Exam: ? ? ? ? ? ? ?General: awake, alert,- Ox1-2; once woken up- stayed awake- better than earlier in week; supine in bed; NAD ?HENT: conjugate gaze; oropharynx moist ?CV: regular rate; no JVD ?Pulmonary: CTA B/L; no W/R/R- good air movement ?GI: soft, NT, ND, (+)BS ?Psychiatric: appropriate- very flat- R gaze preference ?Neurological: L neglect- more awake once woken from sleep- stayed awake better ?MAS of 3  in LUE ?Skin:  Has multiple bruises on right thigh upper extremities, right hip groin is purple some bruising. ?Neurological: Patient is alert to person place and time and situation.  Can follow simple commands left central 7 and left hemianopsia.  Dysarthric. Motor: LLU/LLE 0/5 with significant flexor tone, LUE 1/4, flex/ext tone LUE 1/4, LLE 1/4, flex/ext tone LLE 2-3/4.  Left sternocleidomastoid 2/4.  DTR's 3+ (left side).  Sensory: Can feel light touch on bottom of left toes. ?Musculoskeletal: Mild left hip pain, no pain with palpation. ? ?Vital Signs ?Blood pressure 121/86, pulse 61, temperature 98 ?F (36.7 ?C), temperature source Oral, resp. rate 14, height 6\' 1"  (1.854 m), weight 88 kg, SpO2 98 %. ? ? ? ?Assessment/Plan: ?1. Functional deficits which require 3+ hours per day of interdisciplinary therapy in a comprehensive inpatient rehab setting. ?Physiatrist is providing close team supervision and 24 hour management of active medical problems listed below. ?Physiatrist and rehab team continue to assess barriers to discharge/monitor patient progress toward functional and medical goals ? ?Care Tool: ? ?Bathing ? Bathing activity did not occur: Safety/medical concerns ?Body parts bathed by patient: Chest, Abdomen, Front perineal area, Right upper leg, Left upper leg, Face, Left arm  ? Body parts  bathed by helper: Right arm, Buttocks, Right lower leg, Left lower leg ?  ?  ?Bathing assist Assist Level: 2 Helpers ?  ?  ?Upper Body Dressing/Undressing ?Upper body dressing Upper body dressing/undressing activity did not occur (including orthotics): Safety/medical concerns ?What is the patient wearing?: Pull over shirt ?   ?Upper body assist Assist Level: Moderate Assistance - Patient 50 - 74% ?   ?Lower Body Dressing/Undressing ?Lower body dressing ? ? ?   ?What is the patient wearing?: Pants ? ?  ? ?Lower body assist Assist for lower body dressing: 2 Helpers ?   ? ?Toileting ?Toileting    ?Toileting assist Assist  for toileting: 2 Helpers ?  ?  ?Transfers ?Chair/bed transfer ? ?Transfers assist ? Chair/bed transfer activity did not occur: Safety/medical concerns ? ?Chair/bed transfer assist level: 2 Helpers ?  ?  ?Locomotion ?Ambulation ? ? ?Ambulation assist ? ? Ambulation activity did not occur: Safety/medical concerns ? ?  ?  ?   ? ?Walk 10 feet activity ? ? ?Assist ? Walk 10 feet activity did not occur: Safety/medical concerns ? ?  ?   ? ?Walk 50 feet activity ? ? ?Assist Walk 50 feet with 2 turns activity did not occur: Safety/medical concerns ? ?  ?   ? ? ?Walk 150 feet activity ? ? ?Assist Walk 150 feet activity did not occur: Safety/medical concerns ? ?  ?  ?  ? ?Walk 10 feet on uneven surface  ?activity ? ? ?Assist Walk 10 feet on uneven surfaces activity did not occur: Safety/medical concerns ? ? ?  ?   ? ?Wheelchair ? ? ? ? ?Assist Is the patient using a wheelchair?: Yes ?Type of Wheelchair: Manual ?Wheelchair activity did not occur: Safety/medical concerns ? ?  ?   ? ? ?Wheelchair 50 feet with 2 turns activity ? ? ? ?Assist ? ?  ?Wheelchair 50 feet with 2 turns activity did not occur: Safety/medical concerns ? ? ?   ? ?Wheelchair 150 feet activity  ? ? ? ?Assist ? Wheelchair 150 feet activity did not occur: Safety/medical concerns ? ? ?   ? ?Blood pressure 121/86, pulse 61, temperature 98 ?F (36.7 ?C), temperature source Oral, resp. rate 14, height 6\' 1"  (1.854 m), weight 88 kg, SpO2 98 %. ? ?Medical Problem List and Plan: ?1. Functional deficits secondary to right MCA infarct likely embolic d/t atrial fib. ?-Patient may shower. ? -ELOS/Goals: 14-20 days,  min assist with PT, OT, SLP. ? D/c 01/12/22 ? Changed to SNF- ? Continue CIR- PT, OT and SLP ? -con't amantadine/increase ritalin.  ?2.  Antithrombotics: ?-DVT/anticoagulation:  Eliquis started on 12/08/21.  Dopplers show no blockage. ?            -antiplatelet therapy:N/A ?-4/15 Continue Eliquis 5 mg daily. ?3. Pain Management for left hip pain: Continue  encouraging use of foods from list that aid in pain management. ?-XR without evidence of fracture. ?-Continue focus on spasticity control and ROM. ?-Continue K pad use ?-Continue Tylenol prn and Voltaren gel as needed. ? 4/19- pain controlled except for spasticity- con't regimen ?4. Mood: LCSW did follow for evaluation and support.   ?-Antipsychotic agents: N/A ?5. Neuropsych: Patient may be intermittently capable of making decisions on his own behalf. ?6. Skin/Wound Care: Routine skin checks ?Hydrocortisone for rash on back.  Maculopapular rash is improving. ?7. Fluids/Electrolytes/Nutrition: Routine in and outs with follow-up chemistries ?Encourage p.o. intake. ?8. A-fib: Monitor heart rate 3 times daily heart rate continues to  remain in the 90s. ?4/19- HR 60s- co't regimen ?-Monitor for symptoms with activity. ?9. Dysphagia: Continue D2 nectar liquids with supervision for safety. ?-Encourage fluids Labs continue to be within normal limits. ?10. Hyperlipidemia for HDL continue Crestor 20 mg. ?11.  Constipation: Continue senna twice daily. ?-4/7 multiple BMs after sorbitol. ?-4/11.  Stop Senna since patient's stools are pasty and frequent and he is incontinent -4/15 Pt reports normal BM today. ?4/17- LBM this AM- mushy- incontinent- con't regimen and monitor- due to stroke, likely why incontinent ?4/18- LBM yesterday ?4/21- will place on bowel program with dig stim and suppository after dinner since so incontinent- placed order ?12.acute blood loss anemia: Hemoglobin down from 14.4-12.2. ?- Monitor for signs of bleeding. ?- 4/5 Hgb up to 13.8 ?--4/10 Hemoglobin 13.4 ?--Continue to trend Hgb with next CBC on Monday 12/21/21. ?13.  History of cervical DDD with cord compression: S\P ACDF with residual numbness in bilateral hands. ?14.  Lumbar stenosis: S\P decompression January 2023. ?- 4/11 X-rays of lumbar spine was order because patient had a recent fall and is worried about her hardware. ?- No acute osseous  abnormality identified.  Interval L4-5 fusion. ?15.  Impaired Fasting glucose: Hemoglobin A1c-5.4. ?-Not currently on medication. ?-Continue to trend and monitor. ?16.  Transaminitis: Mild elevation on 4 5. ?Likely re

## 2021-12-26 DIAGNOSIS — I63511 Cerebral infarction due to unspecified occlusion or stenosis of right middle cerebral artery: Secondary | ICD-10-CM | POA: Diagnosis not present

## 2021-12-26 MED ORDER — DANTROLENE SODIUM 25 MG PO CAPS
50.0000 mg | ORAL_CAPSULE | Freq: Three times a day (TID) | ORAL | Status: DC
  Administered 2021-12-26 – 2021-12-28 (×6): 50 mg via ORAL
  Filled 2021-12-26 (×7): qty 2

## 2021-12-26 NOTE — Progress Notes (Signed)
Speech Language Pathology Daily Session Note ? ?Patient Details  ?Name: Patrick Rose ?MRN: [id number] ?Date of Birth: 1950/09/13 ? ?Today's Date: 12/26/2021 ?SLP Individual Time: 0920-1000 ?SLP Individual Time Calculation (min): 40 min ? ?Short Term Goals: ?Week 3: SLP Short Term Goal 1 (Week 3): Pt will participate in MBSS to assess swallow function/physiology and determine least restrictive diet with 100% completion. ?SLP Short Term Goal 1 - Progress (Week 3): Met ?SLP Short Term Goal 2 (Week 3): Pt will consume recommended diet textures with s/sx concerning for aspiration <25% of the time and Min A to implement safe swallow strategies. ?SLP Short Term Goal 3 (Week 3): Pt will utilize speech intelligibility strategies within the context of conversation with Supervision A. ?SLP Short Term Goal 4 (Week 3): Pt will complete functional + basic problem-solving tasks with 80% accuracy given Min A. ?SLP Short Term Goal 5 (Week 3): Pt will attend to L visual field to complete table top tasks with 80% accuracy given visual anchor and Supervision question A. ?SLP Short Term Goal 6 (Week 3): Pt will recall 2-3 activties from ST session given Supervision question A. ? ?Skilled Therapeutic Interventions: Skilled treatment session focused on cognitive goals. Upon arrival, patient was awake in bed and reported fatigue. SLP facilitated session by providing Max verbal cues for problem solving, sustained attention and error awareness during a basic medication management task. SLP also utilized the [employer] Training sheet to focus on visual scanning with use of strategies. SLP provided both Max verbal and visual cues such as a visual anchor, however, patient unable to utilize resulting in patient missing 54/102 occurences of the single stimuli all of which were in his left and central visual fields. However, once SLP minimized visual fields and provided step by step cues, patient only missed 4 occurences. Patient  left upright in bed with alarm on, all needs within reach and wife present. Continue with current plan of care.  ?   ? ?Pain ?No/Denies Pain  ? ?Therapy/Group: Individual Therapy ? ?Lafaye Mcelmurry ?12/26/2021, 12:32 PM ?

## 2021-12-26 NOTE — Progress Notes (Signed)
Physical Therapy Session Note ? ?Patient Details  ?Name: Patrick Rose ?MRN: [id number] ?Date of Birth: 1950-10-15 ? ?Today's Date: 12/26/2021 ?PT Individual Time: 2947-6546 ?PT Individual Time Calculation (min): 55 min  ? ?Short Term Goals: ? ?Week 3:  PT Short Term Goal 1 (Week 3): Patient will perform sitting balance with supervision >2 min with mod cues for midline orientation. ?PT Short Term Goal 2 (Week 3): Patient will perform chair<>chair transfers with mod A +2. ?PT Short Term Goal 3 (Week 3): Pt will perform sit<>stand with Max A using LRAD. ?PT Short Term Goal 4 (Week 3): Patient will perform bed mobility with mod A >50% of the time with use of hospital bed features. ? ? ?Skilled Therapeutic Interventions/Progress Updates:  ? ?Pt received supine in bed and agreeable to PT. Donning pants in supine with bridge to lift buttock of bed and total A for clothing management. RN present for connection of male purewick to catheter bag. Supine>sit transfer with total A* with Pt supporting RUE on PT shoulder. Max assist initially to maintain balance progressing to CGA with maxc cues for midline and activation of deep core to prevent L/posterior bias. Slide board transfer to [facility] with max assist +2 for safety and max cues for attention to task to initiate scoot to the R and prevent pushers response to the L. Pt transported to orthogym in [place].  Standing frame balance and upright tolerance training x 3 min with max cues for erect posture and then performe 2 x 1 min while performing lateral reaches to the R to force weight shift to the R and activation of L glutes. PT performed lrg rest adjustment to improve symmetry of pressure on Bil ischial tubes. Patient returned to room and left sitting in [facility] with call bell in reach and all needs met.   ?  ?   ? ?Therapy Documentation ?Precautions:  ?Precautions ?Precautions: Fall ?Precaution Comments: dense L hemipareisis with UE/LE tone, pushes to L, limited focus ?Restrictions ?Weight  Bearing Restrictions: No ? ? ? ?Pain: ?Pain Assessment ?Pain Scale: 0-10 ?Pain Score: 1  ?Pain Type: Chronic pain ?Pain Location: Back ?Pain Orientation: Left ?Pain Descriptors / Indicators: Sore ?Pain Onset: On-going ?Pain Intervention(s): Ambulation/increased activity;Repositioned ? ? ? ?Therapy/Group: Individual Therapy ? ?Lorie Phenix ?12/26/2021, 11:24 AM  ?

## 2021-12-26 NOTE — Progress Notes (Signed)
?                                                       PROGRESS NOTE ? ? ?Subjective/Complaints: ? ?Pt reports spewed his thickened water accidentally on his leg- ? ?Feels more awake this AM. .  ? ?ROS:  ?Limited by cognition ? ?Objective: ?  ?No results found. ?No results for input(s): WBC, HGB, HCT, PLT in the last 72 hours. ? ?No results for input(s): NA, K, CL, CO2, GLUCOSE, BUN, CREATININE, CALCIUM in the last 72 hours. ? ? ?Intake/Output Summary (Last 24 hours) at 12/26/2021 1051 ?Last data filed at 12/26/2021 0400 ?Gross per 24 hour  ?Intake 480 ml  ?Output 1100 ml  ?Net -620 ml  ?  ? ?Pressure Injury 12/25/21 Coccyx Mid Stage 1 -  Intact skin with non-blanchable redness of a localized area usually over a bony prominence. redness (Active)  ?12/25/21 0507  ?Location: Coccyx  ?Location Orientation: Mid  ?Staging: Stage 1 -  Intact skin with non-blanchable redness of a localized area usually over a bony prominence.  ?Wound Description (Comments): redness  ?Present on Admission: No  ?   ?Pressure Injury 12/25/21 Buttocks Left;Lower Stage 1 -  Intact skin with non-blanchable redness of a localized area usually over a bony prominence. red (Active)  ?12/25/21 0508  ?Location: Buttocks  ?Location Orientation: Left;Lower  ?Staging: Stage 1 -  Intact skin with non-blanchable redness of a localized area usually over a bony prominence.  ?Wound Description (Comments): red  ?Present on Admission: No  ? ? ?Physical Exam: ? ? ? ? ? ?General: awake, alert, appropriate, asking for towel to wipe up leg; - NAD ?HENT: ; oropharynx moist- R gaze preference ?CV: regular rate; no JVD ?Pulmonary: CTA B/L; no W/R/R- good air movement ?GI: soft, NT, ND, (+)BS ?Psychiatric: appropriate- very flat still ?Neurological: more awake, more alert; more attentive ?MAS of 3 in LUE ?Skin:  Has multiple bruises on right thigh upper extremities, right hip groin is purple some bruising. ?Neurological: Patient is alert to person place and time and  situation.  Can follow simple commands left central 7 and left hemianopsia.  Dysarthric. Motor: LLU/LLE 0/5 with significant flexor tone, LUE 1/4, flex/ext tone LUE 1/4, LLE 1/4, flex/ext tone LLE 2-3/4.  Left sternocleidomastoid 2/4.  DTR's 3+ (left side).  Sensory: Can feel light touch on bottom of left toes. ?Musculoskeletal: Mild left hip pain, no pain with palpation. ? ?Vital Signs ?Blood pressure 124/82, pulse 92, temperature (!) 97.4 ?F (36.3 ?C), temperature source Oral, resp. rate 16, height 6\' 1"  (1.854 m), weight 88 kg, SpO2 95 %. ? ? ? ?Assessment/Plan: ?1. Functional deficits which require 3+ hours per day of interdisciplinary therapy in a comprehensive inpatient rehab setting. ?Physiatrist is providing close team supervision and 24 hour management of active medical problems listed below. ?Physiatrist and rehab team continue to assess barriers to discharge/monitor patient progress toward functional and medical goals ? ?Care Tool: ? ?Bathing ? Bathing activity did not occur: Safety/medical concerns ?Body parts bathed by patient: Chest, Abdomen, Front perineal area, Right upper leg, Left upper leg, Face, Left arm  ? Body parts bathed by helper: Right arm, Buttocks, Right lower leg, Left lower leg ?  ?  ?Bathing assist Assist Level: 2 Helpers ?  ?  ?Upper Body Dressing/Undressing ?Upper  body dressing Upper body dressing/undressing activity did not occur (including orthotics): Safety/medical concerns ?What is the patient wearing?: Pull over shirt ?   ?Upper body assist Assist Level: Moderate Assistance - Patient 50 - 74% ?   ?Lower Body Dressing/Undressing ?Lower body dressing ? ? ?   ?What is the patient wearing?: Pants ? ?  ? ?Lower body assist Assist for lower body dressing: 2 Helpers ?   ? ?Toileting ?Toileting    ?Toileting assist Assist for toileting: 2 Helpers ?  ?  ?Transfers ?Chair/bed transfer ? ?Transfers assist ? Chair/bed transfer activity did not occur: Safety/medical concerns ? ?Chair/bed  transfer assist level: 2 Helpers ?  ?  ?Locomotion ?Ambulation ? ? ?Ambulation assist ? ? Ambulation activity did not occur: Safety/medical concerns ? ?  ?  ?   ? ?Walk 10 feet activity ? ? ?Assist ? Walk 10 feet activity did not occur: Safety/medical concerns ? ?  ?   ? ?Walk 50 feet activity ? ? ?Assist Walk 50 feet with 2 turns activity did not occur: Safety/medical concerns ? ?  ?   ? ? ?Walk 150 feet activity ? ? ?Assist Walk 150 feet activity did not occur: Safety/medical concerns ? ?  ?  ?  ? ?Walk 10 feet on uneven surface  ?activity ? ? ?Assist Walk 10 feet on uneven surfaces activity did not occur: Safety/medical concerns ? ? ?  ?   ? ?Wheelchair ? ? ? ? ?Assist Is the patient using a wheelchair?: Yes ?Type of Wheelchair: Manual ?Wheelchair activity did not occur: Safety/medical concerns ? ?  ?   ? ? ?Wheelchair 50 feet with 2 turns activity ? ? ? ?Assist ? ?  ?Wheelchair 50 feet with 2 turns activity did not occur: Safety/medical concerns ? ? ?   ? ?Wheelchair 150 feet activity  ? ? ? ?Assist ? Wheelchair 150 feet activity did not occur: Safety/medical concerns ? ? ?   ? ?Blood pressure 124/82, pulse 92, temperature (!) 97.4 ?F (36.3 ?C), temperature source Oral, resp. rate 16, height 6\' 1"  (1.854 m), weight 88 kg, SpO2 95 %. ? ?Medical Problem List and Plan: ?1. Functional deficits secondary to right MCA infarct likely embolic d/t atrial fib. ?-Patient may shower. ? -ELOS/Goals: 14-20 days,  min assist with PT, OT, SLP. ? D/c 01/12/22 ? Changed to SNF- ? Continue CIR- PT, OT and SLP- increased ritalin as of this AM ?2.  Antithrombotics: ?-DVT/anticoagulation:  Eliquis started on 12/08/21.  Dopplers show no blockage. ?            -antiplatelet therapy:N/A ?-4/15 Continue Eliquis 5 mg daily. ?3. Pain Management for left hip pain: Continue encouraging use of foods from list that aid in pain management. ?-XR without evidence of fracture. ?-Continue focus on spasticity control and ROM. ?-Continue K pad  use ?-Continue Tylenol prn and Voltaren gel as needed. ? 4/19- pain controlled except for spasticity- con't regimen ?4. Mood: LCSW did follow for evaluation and support.   ?-Antipsychotic agents: N/A ?5. Neuropsych: Patient may be intermittently capable of making decisions on his own behalf. ?6. Skin/Wound Care: Routine skin checks ?Hydrocortisone for rash on back.  Maculopapular rash is improving. ?7. Fluids/Electrolytes/Nutrition: Routine in and outs with follow-up chemistries ?Encourage p.o. intake. ?8. A-fib: Monitor heart rate 3 times daily heart rate continues to remain in the 90s. ?4/19- HR 60s- co't regimen ?-Monitor for symptoms with activity. ?9. Dysphagia: Continue D2 nectar liquids with supervision for safety. ?-Encourage fluids Labs continue  to be within normal limits. ?10. Hyperlipidemia for HDL continue Crestor 20 mg. ?11.  Constipation: Continue senna twice daily. ?-4/7 multiple BMs after sorbitol. ?-4/11.  Stop Senna since patient's stools are pasty and frequent and he is incontinent -4/15 Pt reports normal BM today. ?4/17- LBM this AM- mushy- incontinent- con't regimen and monitor- due to stroke, likely why incontinent ?4/18- LBM yesterday ?4/21- will place on bowel program with dig stim and suppository after dinner since so incontinent- placed order ?12.acute blood loss anemia: Hemoglobin down from 14.4-12.2. ?- Monitor for signs of bleeding. ?- 4/5 Hgb up to 13.8 ?--4/10 Hemoglobin 13.4 ?--Continue to trend Hgb with next CBC on Monday 12/21/21. ?13.  History of cervical DDD with cord compression: S\P ACDF with residual numbness in bilateral hands. ?14.  Lumbar stenosis: S\P decompression January 2023. ?- 4/11 X-rays of lumbar spine was order because patient had a recent fall and is worried about her hardware. ?- No acute osseous abnormality identified.  Interval L4-5 fusion. ?15.  Impaired Fasting glucose: Hemoglobin A1c-5.4. ?-Not currently on medication. ?-Continue to trend and monitor. ?16.   Transaminitis: Mild elevation on 4 5. ?Likely reactive with further follow-up. ?4/10 LFTs have returned to baseline. ?17.  Spastic left hemiparesis ?- Resting orthotic in place positioning and stretching per therapy ?

## 2021-12-27 NOTE — Progress Notes (Signed)
Speech Language Pathology Daily Session Note ? ?Patient Details  ?Name: Patrick Rose ?MRN: [id number] ?Date of Birth: [birth year] ? ?Today's Date: 12/27/2021 ?SLP Individual Time: 1105-1200 ?SLP Individual Time Calculation (min): 55 min ? ?Short Term Goals: ?Week 3: SLP Short Term Goal 1 (Week 3): Pt will participate in MBSS to assess swallow function/physiology and determine least restrictive diet with 100% completion. ?SLP Short Term Goal 1 - Progress (Week 3): Met ?SLP Short Term Goal 2 (Week 3): Pt will consume recommended diet textures with s/sx concerning for aspiration <25% of the time and Min A to implement safe swallow strategies. ?SLP Short Term Goal 3 (Week 3): Pt will utilize speech intelligibility strategies within the context of conversation with Supervision A. ?SLP Short Term Goal 4 (Week 3): Pt will complete functional + basic problem-solving tasks with 80% accuracy given Min A. ?SLP Short Term Goal 5 (Week 3): Pt will attend to L visual field to complete table top tasks with 80% accuracy given visual anchor and Supervision question A. ?SLP Short Term Goal 6 (Week 3): Pt will recall 2-3 activties from ST session given Supervision question A. ? ?Skilled Therapeutic Interventions: ? Pt was seen for skilled ST targeting cognitive goals.  Pt reports good toleration of recent diet upgrade and was observed drinking thin liquids during today's therapy session with no overt s/s of aspiration.  SLP facilitated the session with a novel card game targeting functional problem solving and visual scanning.  Pt was able to locate matches between cards on the left and right sides of his table tray with supervision verbal cues for redirection to task in light of distractibility.  SLP increased visual scanning challenge with a meal planning task.  Pt needed up to max assist multimodal cues to locate targeted items from a grocery store flyer due to distractibility and left inattention.  Pt was left in bed with bed alarm  set and call bell within reach.  Continue per current plan of care.   ? ?Pain ?Pain Assessment ?Pain Scale: 0-10 ?Pain Score: 0-No pain ? ? ?Therapy/Group: Individual Therapy ? ?Patrick Rose, Selinda Orion ?12/27/2021, 12:36 PM ?

## 2021-12-28 ENCOUNTER — Inpatient Hospital Stay (HOSPITAL_COMMUNITY): Payer: Medicare (Managed Care)

## 2021-12-28 DIAGNOSIS — I482 Chronic atrial fibrillation, unspecified: Secondary | ICD-10-CM | POA: Diagnosis not present

## 2021-12-28 DIAGNOSIS — I63511 Cerebral infarction due to unspecified occlusion or stenosis of right middle cerebral artery: Secondary | ICD-10-CM | POA: Diagnosis not present

## 2021-12-28 DIAGNOSIS — G8114 Spastic hemiplegia affecting left nondominant side: Secondary | ICD-10-CM | POA: Diagnosis not present

## 2021-12-28 DIAGNOSIS — D62 Acute posthemorrhagic anemia: Secondary | ICD-10-CM | POA: Diagnosis not present

## 2021-12-28 LAB — CBC
HCT: 42.7 % (ref 39.0–52.0)
Hemoglobin: 14.2 g/dL (ref 13.0–17.0)
MCH: 29.6 pg (ref 26.0–34.0)
MCHC: 33.3 g/dL (ref 30.0–36.0)
MCV: 89.1 fL (ref 80.0–100.0)
Platelets: 215 10*3/uL (ref 150–400)
RBC: 4.79 MIL/uL (ref 4.22–5.81)
RDW: 13.5 % (ref 11.5–15.5)
WBC: 6.7 10*3/uL (ref 4.0–10.5)
nRBC: 0 % (ref 0.0–0.2)

## 2021-12-28 LAB — COMPREHENSIVE METABOLIC PANEL
ALT: 35 U/L (ref 0–44)
AST: 23 U/L (ref 15–41)
Albumin: 3.3 g/dL — ABNORMAL LOW (ref 3.5–5.0)
Alkaline Phosphatase: 66 U/L (ref 38–126)
Anion gap: 6 (ref 5–15)
BUN: 16 mg/dL (ref 8–23)
CO2: 26 mmol/L (ref 22–32)
Calcium: 9.2 mg/dL (ref 8.9–10.3)
Chloride: 105 mmol/L (ref 98–111)
Creatinine, Ser: 0.87 mg/dL (ref 0.61–1.24)
GFR, Estimated: >60 mL/min (ref >60)
Glucose, Bld: 122 mg/dL — ABNORMAL HIGH (ref 70–99)
Potassium: 4.2 mmol/L (ref 3.5–5.1)
Sodium: 137 mmol/L (ref 135–145)
Total Bilirubin: 0.6 mg/dL (ref 0.3–1.2)
Total Protein: 6 g/dL — ABNORMAL LOW (ref 6.5–8.1)

## 2021-12-28 MED ORDER — DANTROLENE SODIUM 25 MG PO CAPS
50.0000 mg | ORAL_CAPSULE | Freq: Four times a day (QID) | ORAL | Status: DC
  Administered 2021-12-28 – 2022-01-05 (×32): 50 mg via ORAL
  Filled 2021-12-28 (×33): qty 2

## 2021-12-28 NOTE — Progress Notes (Signed)
Results reviewed with patient and wife --evolutionary changes with question of some petechial hemorrhage but better overall. He reports noticing some blurry vision on left this am as well as tingling on and off? We also discussed that he may be more aware of symptoms now (was performing Oromotor exercises when symptoms occurred per wife) as also reporting some tingling left forearm with ROM exercises.  ?

## 2021-12-28 NOTE — Progress Notes (Addendum)
?                                                       PROGRESS NOTE ? ? ?Subjective/Complaints: ? ?Pt up in bed working with SLP. No new problems. Wife at bedside ? ?ROS: Patient denies fever, rash, sore throat, blurred vision, dizziness, nausea, vomiting, diarrhea, cough, shortness of breath or chest pain, joint or back/neck pain, headache, or mood change.  ? ?Objective: ?  ?No results found. ?Recent Labs  ?  12/28/21 ?[id number]  ?WBC 6.7  ?HGB 14.2  ?HCT 42.7  ?PLT 215  ? ? ?Recent Labs  ?  12/28/21 ?[id number]  ?NA 137  ?K 4.2  ?CL 105  ?CO2 26  ?GLUCOSE 122*  ?BUN 16  ?CREATININE 0.87  ?CALCIUM 9.2  ? ? ? ?Intake/Output Summary (Last 24 hours) at 12/28/2021 1052 ?Last data filed at 12/27/2021 2350 ?Gross per 24 hour  ?Intake 600 ml  ?Output 1650 ml  ?Net -1050 ml  ?  ? ?Pressure Injury 12/25/21 Coccyx Mid Stage 1 -  Intact skin with non-blanchable redness of a localized area usually over a bony prominence. redness (Active)  ?12/25/21 0507  ?Location: Coccyx  ?Location Orientation: Mid  ?Staging: Stage 1 -  Intact skin with non-blanchable redness of a localized area usually over a bony prominence.  ?Wound Description (Comments): redness  ?Present on Admission: No  ?   ?Pressure Injury 12/25/21 Buttocks Left;Lower Stage 1 -  Intact skin with non-blanchable redness of a localized area usually over a bony prominence. red (Active)  ?12/25/21 0508  ?Location: Buttocks  ?Location Orientation: Left;Lower  ?Staging: Stage 1 -  Intact skin with non-blanchable redness of a localized area usually over a bony prominence.  ?Wound Description (Comments): red  ?Present on Admission: No  ? ? ?Physical Exam: ? ? ? ? ? ?Constitutional: No distress . Vital signs reviewed. ?HEENT: NCAT, EOMI, oral membranes moist ?Neck: supple ?Cardiovascular: RRR without murmur. No JVD    ?Respiratory/Chest: CTA Bilaterally without wheezes or rales. Normal effort    ?GI/Abdomen: BS +, non-tender, non-distended ?Ext: no clubbing, cyanosis, or edema ?Psych:  pleasant and cooperative  ?Skin:  Has multiple bruises on right thigh upper extremities, right hip groin is purple some bruising. Redness buttocks, coccyx ?Neurological: Patient is alert to person place and time and situation.  Can follow simple commands left central 7 and left hemianopsia.  Dysarthric. Motor: LLU/LLE 0/5   LUE 1/4, LLE 1/4. LUE tone 2-3/4, LLE 2/4.  DTR's 3+ (left side).  Sensory: Can feel light touch on bottom of left toes. ?Musculoskeletal: Mild left hip pain, no pain with palpation. ? ?Vital Signs ?Blood pressure (!) 115/52, pulse 62, temperature 98.2 ?F (36.8 ?C), temperature source Oral, resp. rate 18, height 6\' 1"  (1.854 m), weight 86.6 kg, SpO2 96 %. ? ? ? ?Assessment/Plan: ?1. Functional deficits which require 3+ hours per day of interdisciplinary therapy in a comprehensive inpatient rehab setting. ?Physiatrist is providing close team supervision and 24 hour management of active medical problems listed below. ?Physiatrist and rehab team continue to assess barriers to discharge/monitor patient progress toward functional and medical goals ? ?Care Tool: ? ?Bathing ? Bathing activity did not occur: Safety/medical concerns ?Body parts bathed by patient: Chest, Abdomen, Front perineal area, Right upper leg, Left upper leg, Face, Left arm  ?  Body parts bathed by helper: Right arm, Buttocks, Right lower leg, Left lower leg ?  ?  ?Bathing assist Assist Level: 2 Helpers ?  ?  ?Upper Body Dressing/Undressing ?Upper body dressing Upper body dressing/undressing activity did not occur (including orthotics): Safety/medical concerns ?What is the patient wearing?: Pull over shirt ?   ?Upper body assist Assist Level: Moderate Assistance - Patient 50 - 74% ?   ?Lower Body Dressing/Undressing ?Lower body dressing ? ? ?   ?What is the patient wearing?: Pants ? ?  ? ?Lower body assist Assist for lower body dressing: 2 Helpers ?   ? ?Toileting ?Toileting    ?Toileting assist Assist for toileting: 2 Helpers ?  ?   ?Transfers ?Chair/bed transfer ? ?Transfers assist ? Chair/bed transfer activity did not occur: Safety/medical concerns ? ?Chair/bed transfer assist level: 2 Helpers ?  ?  ?Locomotion ?Ambulation ? ? ?Ambulation assist ? ? Ambulation activity did not occur: Safety/medical concerns ? ?  ?  ?   ? ?Walk 10 feet activity ? ? ?Assist ? Walk 10 feet activity did not occur: Safety/medical concerns ? ?  ?   ? ?Walk 50 feet activity ? ? ?Assist Walk 50 feet with 2 turns activity did not occur: Safety/medical concerns ? ?  ?   ? ? ?Walk 150 feet activity ? ? ?Assist Walk 150 feet activity did not occur: Safety/medical concerns ? ?  ?  ?  ? ?Walk 10 feet on uneven surface  ?activity ? ? ?Assist Walk 10 feet on uneven surfaces activity did not occur: Safety/medical concerns ? ? ?  ?   ? ?Wheelchair ? ? ? ? ?Assist Is the patient using a wheelchair?: Yes ?Type of Wheelchair: Manual ?Wheelchair activity did not occur: Safety/medical concerns ? ?  ?   ? ? ?Wheelchair 50 feet with 2 turns activity ? ? ? ?Assist ? ?  ?Wheelchair 50 feet with 2 turns activity did not occur: Safety/medical concerns ? ? ?   ? ?Wheelchair 150 feet activity  ? ? ? ?Assist ? Wheelchair 150 feet activity did not occur: Safety/medical concerns ? ? ?   ? ?Blood pressure (!) 115/52, pulse 62, temperature 98.2 ?F (36.8 ?C), temperature source Oral, resp. rate 18, height 6\' 1"  (1.854 m), weight 86.6 kg, SpO2 96 %. ? ?Medical Problem List and Plan: ?1. Functional deficits secondary to right MCA infarct likely embolic d/t atrial fib. ?-Patient may shower. ? -ELOS/Goals:   min assist with PT, OT, SLP. ?  DC dispo Changed to SNF- ? -Continue CIR therapies including PT, OT, and SLP  ?2.  Antithrombotics: ?-DVT/anticoagulation:  Eliquis started on 12/08/21.  Dopplers show no blockage. ?            -antiplatelet therapy:N/A ?-4/15 Continue Eliquis 5 mg daily. ?3. Pain Management for left hip pain: Continue encouraging use of foods from list that aid in pain  management. ?-XR without evidence of fracture. ?-Continue focus on spasticity control and ROM. ?-Continue K pad use ?-Continue Tylenol prn and Voltaren gel as needed. ?   ?4. Mood: LCSW did follow for evaluation and support.   ?-Antipsychotic agents: N/A ?5. Neuropsych: Patient may be intermittently capable of making decisions on his own behalf. ?6. Skin/Wound Care: Routine skin checks ?Hydrocortisone for rash on back.  Maculopapular rash is improving. ?7. Fluids/Electrolytes/Nutrition: encourage PO ?-I personally reviewed the patient's labs today.   ?8. A-fib: Monitor heart rate 3 times daily heart rate continues to remain in the 90s. ?4/24-  HR 60s- co't regimen ?-Monitor for symptoms with activity. ?9. Dysphagia: diet upgraded to D2/thins ?10. Hyperlipidemia for HDL continue Crestor 20 mg. ?11.  Constipation: Continue senna twice daily. ?-4/7 multiple BMs after sorbitol. ?-4/11.  Stop Senna since patient's stools are pasty and frequent and he is incontinent -4/15 Pt reports normal BM today. ?4/17- LBM this AM- mushy- incontinent- con't regimen and monitor- due to stroke, likely why incontinent ?4/18- LBM yesterday ?4/21-bowel program started ?12.acute blood loss anemia: Hemoglobin down from 14.4-12.2. ?- Monitor for signs of bleeding. ?- 4/5 Hgb up to 13.8 ?--4/10 Hemoglobin 13.4 ?13.  History of cervical DDD with cord compression: S\P ACDF with residual numbness in bilateral hands. ?14.  Lumbar stenosis: S\P decompression January 2023. ?- 4/11 X-rays of lumbar spine was order because patient had a recent fall and is worried about her hardware. ?- No acute osseous abnormality identified.  Interval L4-5 fusion. ?15.  Impaired Fasting glucose: Hemoglobin A1c-5.4. ?-Not currently on medication. ?-Continue to trend and monitor. ?16.  Transaminitis: Mild elevation on 4 5. ?Likely reactive with further follow-up. ?4/10 LFTs have returned to baseline. ?17.  Spastic left hemiparesis ?- Resting orthotic in place  positioning and stretching per therapy ?- Improving, tizanidine continue at 4 mg at bedtime ?- Likely Botox candidate as an outpatient ?- Continue tramadol 50 mg every 6 hours. ?4/24. Increase dantrolene to 50mg  qid ?-dc bac

## 2021-12-28 NOTE — Progress Notes (Signed)
Physical Therapy Session Note ? ?Patient Details  ?Name: Patrick Rose ?MRN: [id number] ?Date of Birth: 10-Sep-1950 ? ?Today's Date: 12/28/2021 ?PT Individual Time: 1300-1420 ?PT Individual Time Calculation (min): 80 min  ? ?Short Term Goals: ?Week 3:  PT Short Term Goal 1 (Week 3): Patient will perform sitting balance with supervision >2 min with mod cues for midline orientation. ?PT Short Term Goal 2 (Week 3): Patient will perform chair<>chair transfers with mod A +2. ?PT Short Term Goal 3 (Week 3): Pt will perform sit<>stand with Max A using LRAD. ?PT Short Term Goal 4 (Week 3): Patient will perform bed mobility with mod A >50% of the time with use of hospital bed features. ? ?Skilled Therapeutic Interventions/Progress Updates:  ?   ?Patient in TIS w/c upon PT arrival. Patient alert and agreeable to PT session. Patient reported intermittent L hip pain with onset of spasticity with mobility during session, RN made aware. PT provided repositioning, rest breaks, and distraction as pain interventions throughout session.  ?  ?Noted mild improvement in attention this session.  ?  ?Therapeutic Activity: ?Bed Mobility: Patient performed supine to sit with max-mod A and +2 SBA for safety and sit to supine with max A. Provided verbal cues for initiation, lower extremity management, and attention to task. ?Transfers: Patient performed sit to/from stand x3 with mod A +2 and stand pivot bed>w/c with mod-max A +2. Performed squat pivot w/c>bed with mod A +2 for trunk and lower extremity management during transfer. Provided multimodal cues for forward trunk lean and weight shift, hand placement, initiation, and hip/knee/trunk extension. Blocked L knee and facilitated knee extension for minimal flexion or adduction activation and increased hip/knee extension during standing transfers. ?  ?Neuromuscular Re-ed: ?L hip flexor tone management: ?Applied heat packs to L hip flexor and quadriceps for reduced spasticity while performing  graded prolonged hip flexor stretch in supine with L leg off the bed with manual over pressure over 4 minutes, added knee flexion for quad stretch when tolerated. Patient able to attain slightly past neutral into hip extension without pain  ?Sitting Balance: ?Patient sat EOB >8 min while working on midline orientation with multimodal cues progressed from max A to intermittent supervision with min A, only able to sustain midline with supervision 5-6 sec without cues to correct L lean. Patient able to look R and state he was out of midline then correct to midline with min A for initiation of trunk motion, able to correct alignment to visual target with challenge with sustaining contact with target. With fatigue provided bilateral support for patient with PT and rehab tech sitting shoulder to shoulder on either side of patient forcing patient to maintain midline >2 min for improved awareness of midline in sitting. ?Therapeutic Gait training: ?Patient ambulated 10-20 feet x3 limited by decreased attention to task leading to poor trunk control and initiation of stepping. Utilized 3 musketeer technique with max A +2 and total-max A L limb advancement with +3 for w/c follow. Improved midline orientation and posture with continues L bias and notable hip flexion and hamstring activation to initiate L limb advancement, required max-total A for L knee extension during advancement and stance. ?Performed midline cues, as above in sitting during seated rest breaks between trials. ? ?Following mobility, patient reported new L facial numbness and blurred vision since ~12pm. No other new symptoms noted with neuro assessment, vitals WNL. Patient endorses poor sleep last night. MD and RN made aware of patient's symptoms and lack of sleep. Educated  on fluctuations of symptoms during recovery and impact of sleep quality on symptoms. Instructed patient and his wife to inform staff immediately when new symptoms occur. Stated  understanding. ?  ?Patient in bed with his wife at bedside at end of session with breaks locked, bed alarm set, and all needs within reach. Instructed patient to lie flat in supine x30 min with L PRAFO donned to stretch hip flexor again later this afternoon. Patient in agreement.  ? ?Therapy Documentation ?Precautions:  ?Precautions ?Precautions: Fall ?Precaution Comments: dense L hemipareisis with UE/LE tone, pushes to L, limited focus ?Restrictions ?Weight Bearing Restrictions: No ? ? ?Therapy/Group: Individual Therapy ? ?Helayne Seminole PT, DPT ? ?12/28/2021, 5:54 PM  ?

## 2021-12-28 NOTE — Plan of Care (Signed)
Several goals downgraded d/t severe inattention and L pushing tendency making min A goals unrealistic at this time  ? ?Problem: RH Bathing ?Goal: LTG Patient will bathe all body parts with assist levels (OT) ?Description: LTG: Patient will bathe all body parts with assist levels (OT) ?Flowsheets (Taken 12/28/2021 [phone]) ?LTG: Pt will perform bathing with assistance level/cueing: (downgraded 4/24 d/t severe inattention and pushers) Moderate Assistance - Patient 50 - 74% ?  ?Problem: RH Dressing ?Goal: LTG Patient will perform lower body dressing w/assist (OT) ?Description: LTG: Patient will perform lower body dressing with assist, with/without cues in positioning using equipment (OT) ?Flowsheets (Taken 12/28/2021 [phone]) ?LTG: Pt will perform lower body dressing with assistance level of: (downgraded 4/24 d/t severe inattention and pushers) Maximal Assistance - Patient 25 - 49% ?  ?Problem: RH Toilet Transfers ?Goal: LTG Patient will perform toilet transfers w/assist (OT) ?Description: LTG: Patient will perform toilet transfers with assist, with/without cues using equipment (OT) ?Flowsheets (Taken 12/28/2021 [phone]) ?LTG: Pt will perform toilet transfers with assistance level of: (downgraded 4/24 d/t severe inattention and pushers) Moderate Assistance - Patient 50 - 74% ?  ?Problem: Sit to Stand ?Goal: LTG:  Patient will perform sit to stand in prep for activites of daily living with assistance level (OT) ?Description: LTG:  Patient will perform sit to stand in prep for activites of daily living with assistance level (OT) ?Flowsheets (Taken 12/28/2021 [phone]) ?LTG: PT will perform sit to stand in prep for activites of daily living with assistance level: (downgraded 4/24 d/t severe inattention and pushers) Moderate Assistance - Patient 50 - 74% ?  ?

## 2021-12-28 NOTE — Progress Notes (Signed)
Speech Language Pathology Weekly Progress Note ? ?Patient Details  ?Name: Patrick Rose ?MRN: [id number] ?Date of Birth: [birth year] ? ?Beginning of progress report period: December 21, 2021 ?End of progress report period: December 28, 2021 ? ?Short Term Goals: ?Week 3: SLP Short Term Goal 1 (Week 3): Pt will participate in MBSS to assess swallow function/physiology and determine least restrictive diet with 100% completion. ?SLP Short Term Goal 1 - Progress (Week 3): Met ?SLP Short Term Goal 2 (Week 3): Pt will consume recommended diet textures with s/sx concerning for aspiration <25% of the time and Min A to implement safe swallow strategies. ?SLP Short Term Goal 2 - Progress (Week 3): Not met ?SLP Short Term Goal 3 (Week 3): Pt will utilize speech intelligibility strategies within the context of conversation with Supervision A. ?SLP Short Term Goal 3 - Progress (Week 3): Met ?SLP Short Term Goal 4 (Week 3): Pt will complete functional + basic problem-solving tasks with 80% accuracy given Min A. ?SLP Short Term Goal 4 - Progress (Week 3): Not met ?SLP Short Term Goal 5 (Week 3): Pt will attend to L visual field to complete table top tasks with 80% accuracy given visual anchor and Supervision question A. ?SLP Short Term Goal 5 - Progress (Week 3): Not met ?SLP Short Term Goal 6 (Week 3): Pt will recall 2-3 activties from ST session given Supervision question A. ?SLP Short Term Goal 6 - Progress (Week 3): Met ? ?  ?New Short Term Goals: ?Week 4: SLP Short Term Goal 1 (Week 4): Pt will consume recommended diet textures with s/sx concerning for aspiration <25% of the time and Mod A to implement safe swallow strategies. ?SLP Short Term Goal 2 (Week 4): Pt will complete functional basic problem-solving tasks with 80% accuracy given Min A. ?SLP Short Term Goal 3 (Week 4): Pt will attend to L visual field to complete table top tasks with 80% accuracy given visual anchor and Min vebral cues. ?SLP Short Term Goal 4 (Week 4): Patient  will demonstrate sustained attention to functional tasks for 5 minutes with Mod verbal cues for redirection. ?SLP Short Term Goal 5 (Week 4): Patient will self-monitor and correct errors during functional tasks with Mod verbal cues. ? ?Weekly Progress Updates: Patient has made some functional gains and has met 3 of 6 STGs this reporting period. Patient has been upgraded to thin liquids and is currently consuming his current diet of Dys. 2 textures with thin liquids with minimal overt s/s of aspiration. However, patient requires overall Max A multimodal cues for use of swallowing compensatory strategies, especially regarding self-monitoring and correcting left buccal pocketing. Due to this, patient requires thorough oral care after all meals. Recommend patient continue current diet. Patient demonstrates mildly improved sustained attention with decreased verbosity with increased ability to be redirected. Overall, patient continues to require Mod-Max A multimodal cues to complete functional and basic problem solving tasks safely in regards to problem solving, attention, left visual scanning, and error awareness. Patient remains mildly dysarthric but is essentially 100% intelligible. Patient and family education is ongoing. Patient would benefit from continued skilled SLP intervention to maximize his swallowing and cognitive functioning prior to discharge.  ?  ? ?Intensity: Minumum of 1-2 x/day, 30 to 90 minutes ?Frequency: 3 to 5 out of 7 days ?Duration/Length of Stay: 01/12/22 ?Treatment/Interventions: Cognitive remediation/compensation;Cueing hierarchy;Dysphagia/aspiration precaution training;Functional tasks;Medication managment;Patient/family education;Internal/external aids;Speech/Language facilitation ? ? ?Patrick Rose ?12/28/2021, 6:55 AM ? ? ? ? ? ? ?

## 2021-12-28 NOTE — Progress Notes (Signed)
Speech Language Pathology Daily Session Note ? ?Patient Details  ?Name: Patrick Rose ?MRN: [id number] ?Date of Birth: 1951-04-14 ? ?Today's Date: 12/28/2021 ?SLP Individual Time: 3329-5188 ?SLP Individual Time Calculation (min): 55 min ? ?Short Term Goals: ?Week 4: SLP Short Term Goal 1 (Week 4): Pt will consume recommended diet textures with s/sx concerning for aspiration <25% of the time and Mod A to implement safe swallow strategies. ?SLP Short Term Goal 2 (Week 4): Pt will complete functional basic problem-solving tasks with 80% accuracy given Min A. ?SLP Short Term Goal 3 (Week 4): Pt will attend to L visual field to complete table top tasks with 80% accuracy given visual anchor and Min vebral cues. ?SLP Short Term Goal 4 (Week 4): Patient will demonstrate sustained attention to functional tasks for 5 minutes with Mod verbal cues for redirection. ?SLP Short Term Goal 5 (Week 4): Patient will self-monitor and correct errors during functional tasks with Mod verbal cues. ? ?Skilled Therapeutic Interventions:Skilled ST services focused on swallow and cognitive skills. SLP facilitated PO intake of breakfast tray, dys 2 textures and thin liquids via straw. Pt demonstrated recall of swallow strategies with mod A fading to min A verbal cues, however pt continued to require max A verbal cues to clear left buccal pocketing. Pt also required mod A verbal cues in 5 minute intervals for sustained attention during self-feeding. Oral care was completed at the end of meal by pt with set up assist, removing mild-moderate oral residue. SLP educated pt and pt's wife to order a "sticky" substance on each tray (ex: oatmeal/grits, mashed potatoes) to aid in bolus cohesion and to possibly reduce some of the left buccal pocketing. Pt completed a letter cancellation task, requiring supervision A verbal cues to scan left of midline and min A verbal cues for sustained attention. Pt was left in room with wife, call bell within reach and  bed alarm set. SLP recommends to continue skilled services. ?   ? ?Pain ?Pain Assessment ?Pain Score: 0-No pain ? ?Therapy/Group: Individual Therapy ? ?Jacai Kipp ?12/28/2021, 1:50 PM ?

## 2021-12-28 NOTE — Progress Notes (Signed)
Patient ID: Patrick Rose, male   DOB: 1951-01-10, 71 y.o.   MRN: [id number] ? ?Review of Epic updates shows that [facility] declined as well. ? ?SW spoke with pt wife [name] to discuss SNFs that have extended a bed offer. Reports that she would like more time to review locations. SW discussed SNF placement process. SW emailed pt wife ([email or web address]) a list of SNF updates.  ? ?Loralee Pacas, MSW, LCSWA ?Office: [phone] ?Cell: [phone] ?Fax: [phone]  ?

## 2021-12-28 NOTE — Progress Notes (Signed)
Occupational Therapy Session Note ? ?Patient Details  ?Name: Patrick Rose ?MRN: [id number] ?Date of Birth: 07/13/1951 ? ?Today's Date: 12/28/2021 ?OT Individual Time: 8022-3361 ?OT Individual Time Calculation (min): 60 min  ? ? ?Short Term Goals: ?Week 3:  OT Short Term Goal 1 (Week 3): Pt will sit EOB with mod A without UE support ?OT Short Term Goal 2 (Week 3): Pt will don a shirt with assist only to thread LUE ?OT Short Term Goal 3 (Week 3): Pt will attend to simple grooming task with no more than mod cueing ?OT Short Term Goal 4 (Week 3): Pt will complete transfer with LRAD to the w/c with max A of 1 ? ?Skilled Therapeutic Interventions/Progress Updates:  ?  Pt received supine with no c/o pain. He completed bed mobility rolling R with min A and L with max A. He had a small incontinent BM and required max A for hygiene. He required max A to come to EOB with max cueing/facilitation for body mechanics and LLE management. He required max cueing for sustained attention during sitting balance, requiring intermittent CGA but majority max A. He donned a shirt with mod A, good carryover of hemi technique with mod cueing. Pt completed a slideboard transfer to the TIS with max A. He required max +2 assist to stand at the sink. Heavy cueing and assist to maintain visual attention in the mirror. While standing he had incontinent BM and was assisted back to sitting d/t inability to manage pt balance and provide peri care. Max-total A +2 back to bed via squat pivot. Pt was left supine with all needs met, bed alarm set. RN aware of need to be cleaned up further supine.  ? ? ?Saebo Stim One was placed on his L bicep for muscle activation and visual attention to task. He reported no c/o pain or adverse skin reaction before/after tx.  ?330 pulse width ?35 Hz pulse rate ?On 8 sec/ off 8 sec ?Ramp up/ down 2 sec ?Symmetrical Biphasic wave form  ?Max intensity 150m at 500 Ohm load ?  ? ?Therapy Documentation ?Precautions:   ?Precautions ?Precautions: Fall ?Precaution Comments: dense L hemipareisis with UE/LE tone, pushes to L, limited focus ?Restrictions ?Weight Bearing Restrictions: No ? ?Therapy/Group: Individual Therapy ? ?SCurtis Sites?12/28/2021, 6:33 AM ?

## 2021-12-29 DIAGNOSIS — I63511 Cerebral infarction due to unspecified occlusion or stenosis of right middle cerebral artery: Secondary | ICD-10-CM | POA: Diagnosis not present

## 2021-12-29 DIAGNOSIS — D62 Acute posthemorrhagic anemia: Secondary | ICD-10-CM | POA: Diagnosis not present

## 2021-12-29 DIAGNOSIS — G8114 Spastic hemiplegia affecting left nondominant side: Secondary | ICD-10-CM | POA: Diagnosis not present

## 2021-12-29 DIAGNOSIS — I482 Chronic atrial fibrillation, unspecified: Secondary | ICD-10-CM | POA: Diagnosis not present

## 2021-12-29 MED ORDER — FLEET ENEMA 7-19 GM/118ML RE ENEM
1.0000 | ENEMA | Freq: Once | RECTAL | Status: AC
  Administered 2021-12-29: 1 via RECTAL
  Filled 2021-12-29: qty 1

## 2021-12-29 MED ORDER — METHYLPHENIDATE HCL 5 MG PO TABS
15.0000 mg | ORAL_TABLET | Freq: Two times a day (BID) | ORAL | Status: DC
  Administered 2021-12-30 – 2022-01-05 (×13): 15 mg via ORAL
  Filled 2021-12-29 (×13): qty 3

## 2021-12-29 NOTE — Progress Notes (Signed)
?                                                       PROGRESS NOTE ? ? ?Subjective/Complaints: ? ?Pt had a reasonable night. Still reports facial numbness. Just finished breakfast with SLP. Retaining some food in left cheek. ? ?ROS: Patient denies fever, rash, sore throat, blurred vision, dizziness, nausea, vomiting, diarrhea, cough, shortness of breath or chest pain, joint or back/neck pain, headache, or mood change.  ? ?Objective: ?  ?CT HEAD WO CONTRAST ( ) ? ?Result Date: 12/28/2021 ?CLINICAL DATA:  Stroke, follow up; new tingling along the left side of face since noon EXAM: CT HEAD WITHOUT CONTRAST TECHNIQUE: Contiguous axial images were obtained from the base of the skull through the vertex without intravenous contrast. RADIATION DOSE REDUCTION: This exam was performed according to the departmental dose-optimization program which includes automated exposure control, adjustment of the mA and/or kV according to patient size and/or use of iterative reconstruction technique. COMPARISON:  12/08/2021 FINDINGS: Brain: Evolution of right MCA territory infarction involving the frontal lobe with some parietal extension, basal ganglia, and insula. Similar evolution of right ACA territory infarction involving the frontal lobe. Edema and mass effect have significantly improved. Mildly increased density may reflect presence of petechial hemorrhage, but there is no discrete hematoma. There is no new loss of gray-white differentiation separate from above. Probable wallerian degeneration along the right corticospinal tract. Vascular: No new finding. Skull: Calvarium is unremarkable. Sinuses/Orbits: No acute finding. Other: None. IMPRESSION: Expected evolution of right MCA and ACA territory infarctions. There may be some petechial hemorrhage, but there is no discrete hematoma. Mass effect is significantly improved. Electronically Signed   By: Guadlupe Spanish M.D.   On: 12/28/2021 16:29   ?Recent Labs  ?  12/28/21 ?6644   ?WBC 6.7  ?HGB 14.2  ?HCT 42.7  ?PLT 215  ? ? ?Recent Labs  ?  12/28/21 ?0347  ?NA 137  ?K 4.2  ?CL 105  ?CO2 26  ?GLUCOSE 122*  ?BUN 16  ?CREATININE 0.87  ?CALCIUM 9.2  ? ? ? ?Intake/Output Summary (Last 24 hours) at 12/29/2021 1143 ?Last data filed at 12/29/2021 0700 ?Gross per 24 hour  ?Intake 660 ml  ?Output 3250 ml  ?Net -2590 ml  ?  ? ?Pressure Injury 12/25/21 Coccyx Mid Stage 1 -  Intact skin with non-blanchable redness of a localized area usually over a bony prominence. redness (Active)  ?12/25/21 0507  ?Location: Coccyx  ?Location Orientation: Mid  ?Staging: Stage 1 -  Intact skin with non-blanchable redness of a localized area usually over a bony prominence.  ?Wound Description (Comments): redness  ?Present on Admission: No  ?   ?Pressure Injury 12/25/21 Buttocks Left;Lower Stage 1 -  Intact skin with non-blanchable redness of a localized area usually over a bony prominence. red (Active)  ?12/25/21 0508  ?Location: Buttocks  ?Location Orientation: Left;Lower  ?Staging: Stage 1 -  Intact skin with non-blanchable redness of a localized area usually over a bony prominence.  ?Wound Description (Comments): red  ?Present on Admission: No  ? ? ?Physical Exam: ? ? ? ? ? ? ?Vital Signs ?Blood pressure (!) 141/64, pulse 62, temperature 98.2 ?F (36.8 ?C), temperature source Oral, resp. rate 18, height 6\' 1"  (1.854 m), weight 86.6 kg, SpO2 97 %. ?Constitutional: No distress .  Vital signs reviewed. ?HEENT: NCAT, EOMI, oral membranes moist. Small amount of grits in left cheek. ?Neck: supple ?Cardiovascular: RRR without murmur. No JVD    ?Respiratory/Chest: CTA Bilaterally without wheezes or rales. Normal effort    ?GI/Abdomen: BS +, non-tender, non-distended ?Ext: no clubbing, cyanosis, or edema ?Psych: pleasant and cooperative  ?Skin:  Has multiple bruises on right thigh upper extremities, right hip groin is purple some bruising. Redness buttocks, coccyx ?Neurological: Patient is alert to person place and time and  situation.  Can follow simple commands left central 7 and left hemianopsia. Left cheek/perioral area with sl decrease in sensation of light touch.  Dysarthric. Motor: LLU/LLE 0/5   LUE 1/4, LLE 1/4. LUE tone 2-3/4, LLE 2/4.  DTR's 3+ (left side).     ?Musculoskeletal: Mild left hip pain with PROM, no pain with palpation. ? ? ?Assessment/Plan: ?1. Functional deficits which require 3+ hours per day of interdisciplinary therapy in a comprehensive inpatient rehab setting. ?Physiatrist is providing close team supervision and 24 hour management of active medical problems listed below. ?Physiatrist and rehab team continue to assess barriers to discharge/monitor patient progress toward functional and medical goals ? ?Care Tool: ? ?Bathing ? Bathing activity did not occur: Safety/medical concerns ?Body parts bathed by patient: Chest, Abdomen, Front perineal area, Right upper leg, Left upper leg, Face, Left arm  ? Body parts bathed by helper: Right arm, Buttocks, Right lower leg, Left lower leg ?  ?  ?Bathing assist Assist Level: 2 Helpers ?  ?  ?Upper Body Dressing/Undressing ?Upper body dressing Upper body dressing/undressing activity did not occur (including orthotics): Safety/medical concerns ?What is the patient wearing?: Pull over shirt ?   ?Upper body assist Assist Level: Moderate Assistance - Patient 50 - 74% ?   ?Lower Body Dressing/Undressing ?Lower body dressing ? ? ?   ?What is the patient wearing?: Pants ? ?  ? ?Lower body assist Assist for lower body dressing: 2 Helpers ?   ? ?Toileting ?Toileting    ?Toileting assist Assist for toileting: 2 Helpers ?  ?  ?Transfers ?Chair/bed transfer ? ?Transfers assist ? Chair/bed transfer activity did not occur: Safety/medical concerns ? ?Chair/bed transfer assist level: 2 Helpers ?  ?  ?Locomotion ?Ambulation ? ? ?Ambulation assist ? ? Ambulation activity did not occur: Safety/medical concerns ? ?  ?  ?   ? ?Walk 10 feet activity ? ? ?Assist ? Walk 10 feet activity did not  occur: Safety/medical concerns ? ?  ?   ? ?Walk 50 feet activity ? ? ?Assist Walk 50 feet with 2 turns activity did not occur: Safety/medical concerns ? ?  ?   ? ? ?Walk 150 feet activity ? ? ?Assist Walk 150 feet activity did not occur: Safety/medical concerns ? ?  ?  ?  ? ?Walk 10 feet on uneven surface  ?activity ? ? ?Assist Walk 10 feet on uneven surfaces activity did not occur: Safety/medical concerns ? ? ?  ?   ? ?Wheelchair ? ? ? ? ?Assist Is the patient using a wheelchair?: Yes ?Type of Wheelchair: Manual ?Wheelchair activity did not occur: Safety/medical concerns ? ?  ?   ? ? ?Wheelchair 50 feet with 2 turns activity ? ? ? ?Assist ? ?  ?Wheelchair 50 feet with 2 turns activity did not occur: Safety/medical concerns ? ? ?   ? ?Wheelchair 150 feet activity  ? ? ? ?Assist ? Wheelchair 150 feet activity did not occur: Safety/medical concerns ? ? ?   ? ?  Blood pressure (!) 141/64, pulse 62, temperature 98.2 ?F (36.8 ?C), temperature source Oral, resp. rate 18, height 6\' 1"  (1.854 m), weight 86.6 kg, SpO2 97 %. ? ?Medical Problem List and Plan: ?1. Functional deficits secondary to right MCA infarct likely embolic d/t atrial fib. ?-Patient may shower. ? -ELOS/Goals:   min assist with PT, OT, SLP. ?  DC dispo Changed to SNF- ? -Continue CIR therapies including PT, OT, and SLP. Interdisciplinary team conference today to discuss goals, barriers to discharge, and dc planning.   ? -4/24 reviewed CT results again with pt/wife. Demonstrates expected evolution of stroke. There are no other changes on exam. Likely he is gaining more aware of his left sided deficits and notices sensory loss more. ?2.  Antithrombotics: ?-DVT/anticoagulation:  Eliquis started on 12/08/21.  Dopplers show no blockage. ?            -antiplatelet therapy:N/A ?-4/15 Continue Eliquis 5 mg daily. ?3. Pain Management for left hip pain: Continue encouraging use of foods from list that aid in pain management. ?-XR without evidence of fracture. ?-Continue  focus on spasticity control and ROM. ?-Continue K pad use ?-Continue Tylenol prn and Voltaren gel as needed. ?   ?4. Mood: LCSW did follow for evaluation and support.   ?-Antipsychotic agents: N/A ?5. Neuropsy

## 2021-12-29 NOTE — Progress Notes (Signed)
Occupational Therapy Session Note ? ?Patient Details  ?Name: Patrick Rose ?MRN: [id number] ?Date of Birth: Oct 01, 1950 ? ?Today's Date: 12/29/2021 ?OT Individual Time: 1300-1400 ?OT Individual Time Calculation (min): 60 min  ? ? ?Short Term Goals: ?Week 3:  OT Short Term Goal 1 (Week 3): Pt will sit EOB with mod A without UE support ?OT Short Term Goal 2 (Week 3): Pt will don a shirt with assist only to thread LUE ?OT Short Term Goal 3 (Week 3): Pt will attend to simple grooming task with no more than mod cueing ?OT Short Term Goal 4 (Week 3): Pt will complete transfer with LRAD to the w/c with max A of 1 ? ?Skilled Therapeutic Interventions/Progress Updates:  ?  Pt received sitting up in the TIS with no c/o pain, agreeable to OT session. Session focused on midline awareness/orientation, LUE NMR, standing balance, and sustained attention to task. He was taken via TIS w/c to the therapy gym. In the parallel bars he stood with mod +2 assist. Once standing he required heavy max A until he was cued to reach over the the R and then he was able to maintain standing balance with mod A. He required frequent max cueing to maintain attention to R reaching. He required facilitation at the L knee to maintain extension to support even BLE weightbearing. He completed 4+ trials of this with multiple activities placed to his R to encourage reaching. Lastly, the saebo was placed on his tricep to facilitate LUE weightbearing through the bar with max facilitation. He returned to his room and was left sitting up with all needs met, wife present.  ? ? ?Saebo Stim One was placed on his L bicep for muscle activation and increasing L attention to his LUE. He reported no pain and had no adverse skin reactions.  ?330 pulse width ?35 Hz pulse rate ?On 8 sec/ off 8 sec ?Ramp up/ down 2 sec ?Symmetrical Biphasic wave form  ?Max intensity 127m at 500 Ohm load ? ? ? ?Therapy Documentation ?Precautions:  ?Precautions ?Precautions: Fall ?Precaution  Comments: dense L hemipareisis with UE/LE tone, pushes to L, limited focus ?Restrictions ?Weight Bearing Restrictions: No ? ?Therapy/Group: Individual Therapy ? ?SCurtis Sites?12/29/2021, 6:42 AM ?

## 2021-12-29 NOTE — Patient Care Conference (Signed)
Inpatient RehabilitationTeam Conference and Plan of Care Update ?Date: 12/29/2021   Time: 10:38 AM  ? ? ?Patient Name: Patrick Rose      ?Medical Record Number: [id number]  ?Date of Birth: Apr 18, 1951 ?Sex: Male         ?Room/Bed: 5C02C/5C02C-01 ?Payor Info: Payor: [employer] MEDICARE / Plan: [employer] MEDICARE HMO/PPO / Product Type: *No Product type* /   ? ?Admit Date/Time:  12/08/2021  5:04 PM ? ?Primary Diagnosis:  Acute ischemic right MCA stroke (HCC) ? ?Hospital Problems: Principal Problem: ?  Acute ischemic right MCA stroke (HCC) ?Active Problems: ?  Pressure injury of skin ? ? ? ?Expected Discharge Date: Expected Discharge Date:  (SNF) ? ?Team Members Present: ?Physician leading conference: Dr. Faith Rogue ?Social Worker Present: Cecile Sheerer, LCSWA ?Nurse Present: Kennyth Arnold, RN ?PT Present: Serina Cowper, PT ?OT Present: Jake Shark, OT ?SLP Present: Feliberto Gottron, SLP ?PPS Coordinator present : Fae Pippin, SLP ? ?   Current Status/Progress Goal Weekly Team Focus  ?Bowel/Bladder ? ? incontinent B/B, lbm 4/24  regain continence  toilet q 2 hr and prn   ?Swallow/Nutrition/ Hydration ? ? Dys. 2 textures with thin liquids, Mod-Max A for use of swallow strategies  Mod I  use of swallowing compenstory strategies   ?ADL's ? ? Mod A UB dressing, total A LB, profound attention deficits, L hemi and pushers, max +2 to stand and slideboard  Mod A  L NMR, ADls, sitting balance, sustained attention, transfers   ?Mobility ? ? Max-mod A bed mobility, mod A +2 sit to stand and max of 1-2 squat pivot transfers, max A therapeutic gait, slow, but steady progress, attention with mild improvement this week, flexor tone improving, some automatic hip flexor and hamstring activiation, no volitional activation of L lower extremtiy  downgraded to mod-max A overall due to slow progress  Midline orientation, activity tolerance, functional mobility, L hemi-body NMR, tone/spasticity management, standing and therapeutic gait  training, patient/caregiver education   ?Communication ? ? Supervision  Supervision  use of speech intelligibility strategies   ?Safety/Cognition/ Behavioral Observations ? Mod-Max A  Min A  sustained attention, left visual scanning, awareness and basic problem solving   ?Pain ? ? 2/10 pain lower back  < 3  assess q 4hr and prn   ?Skin ? ? stage 1 coccyx, MASD, left buttock stage 1  no new breakdown  assess skin q shift and prn   ? ? ?Discharge Planning:  ?Pt to d/c to SNF pending bed offer and insurance auth approval.   ?Team Discussion: ?Spasticity improving. Scan looks okay, small hemorrhages possible. Fatigued. Botox to be outpatient. Adjusting medications. Fleet enema ordered for tonight. Incontinent B/B, LBM 4/24. Low back pain reported. Treating skin appropriately. SNF discharge, waiting on spouse to make a decision. ? ?Patient on target to meet rehab goals: ?Mod assist, will adjust for SNF placement. Currently mod assist for attention, max assist +2. Mod/max assist bed mobility, mod assist +2 standing. Stretching and heat helps. Dys 2 with left pocketing, thin liquids. Easier to be redirected. ? ?*See Care Plan and progress notes for long and short-term goals.  ? ?Revisions to Treatment Plan:  ?Adjusting medications, adjusting goals for SNF placement. ?  ?Teaching Needs: ?Family education, medication/pain management, skin/wound care, transfer/gait training, etc. ?  ?Current Barriers to Discharge: ?Decreased caregiver support, Home enviroment access/layout, Incontinence, Wound care, and Lack of/limited family support ? ?Possible Resolutions to Barriers: ?Family education ?Spouse to make a decision on SNF location ?  ? ?  Medical Summary ?Current Status: ongoing left hemiparesis, spasticity, incontinent of stool. "new" sensory symptoms left face ? Barriers to Discharge: Medical stability ?  ?Possible Resolutions to [employer]: daily assessment of skin/bowel regimen, spasticity mgt---will need botox  as outpt ? ? ?Continued Need for Acute Rehabilitation Level of Care: The patient requires daily medical management by a physician with specialized training in physical medicine and rehabilitation for the following reasons: ?Direction of a multidisciplinary physical rehabilitation program to maximize functional independence : Yes ?Medical management of patient stability for increased activity during participation in an intensive rehabilitation regime.: Yes ?Analysis of laboratory values and/or radiology reports with any subsequent need for medication adjustment and/or medical intervention. : Yes ? ? ?I attest that I was present, lead the team conference, and concur with the assessment and plan of the team. ? ? ?Kennyth Arnold G ?12/29/2021, 1:56 PM  ? ? ? ? ? ? ?

## 2021-12-29 NOTE — Progress Notes (Signed)
Physical Therapy Session Note ? ?Patient Details  ?Name: Patrick Rose ?MRN: [id number] ?Date of Birth: [birth year] ? ?Today's Date: 12/29/2021 ?PT Individual Time: 8882-8003 ?PT Individual Time Calculation (min): 55 min  ? ?Short Term Goals: ?Week 3:  PT Short Term Goal 1 (Week 3): Patient will perform sitting balance with supervision >2 min with mod cues for midline orientation. ?PT Short Term Goal 2 (Week 3): Patient will perform chair<>chair transfers with mod A +2. ?PT Short Term Goal 3 (Week 3): Pt will perform sit<>stand with Max A using LRAD. ?PT Short Term Goal 4 (Week 3): Patient will perform bed mobility with mod A >50% of the time with use of hospital bed features. ? ?Skilled Therapeutic Interventions/Progress Updates:  ?   ?Patient in bed upon PT arrival. Patient alert and agreeable to PT session. Patient reported intermittent L hip pain with onset of spasticity with mobility during session, RN made aware. PT provided repositioning, rest breaks, and distraction as pain interventions throughout session.  ?  ?Therapeutic Activity: ?Bed Mobility: Patient performed supine to sit with mod A. Provided verbal cues for initiation, transitioning through side-lying to minimize back pain, lower extremity management, and attention to task. ?Transfers: Patient performed sit to/from stand x3 with mod A +2 and squat pivot bed>w/c with mod-max A +2, increased assist due to sudden onset of R upper extremity pushing on arm rest. Provided multimodal cues for forward trunk lean and weight shift, hand placement, initiation, and hip/knee/trunk extension. Blocked L knee and facilitated knee extension for minimal flexion or adduction activation and increased hip/knee extension during standing transfers. ?  ?Neuromuscular Re-ed: ?L hip flexor tone management: ?Applied heat packs to L hip flexor and quadriceps for reduced spasticity while performing graded prolonged hip flexor stretch in supine with L leg off the bed with manual  over pressure over 3 minutes, added knee flexion for quad stretch when tolerated. Patient able to attain slightly past neutral into hip extension without pain  ?Sitting Balance: ?Patient sat EOB >8 min while working on midline orientation with multimodal cues progressed from max A to intermittent supervision with min A, only able to sustain midline with supervision 5-6 sec without cues to correct L lean. Patient able to look R and state he was out of midline then correct to midline with min A for initiation of trunk motion, able to correct alignment to visual target with challenge with sustaining contact with target. Patient doffed/donned a t-shirt with max A for dressing and mod-CGA for sitting balance, posterior and L LOB with attention on dressing, able to self correct if cued early ?Therapeutic Gait training: ?Patient ambulated 10-20 feet x3 limited by decreased attention to task leading to poor trunk control and initiation of stepping. Utilized 3 musketeer technique with max A +2 and total-max A L limb advancement with anterior strut L AFO for DF assist and reduced knee buckling. Improved midline orientation and posture with continued L bias and notable hip flexion and hamstring activation to initiate L limb advancement, required max-total A for L knee extension during advancement and stance. ?  ?Patient in bed with his wife at bedside at end of session with breaks locked, bed alarm set, and all needs within reach.  ? ?Therapy Documentation ?Precautions:  ?Precautions ?Precautions: Fall ?Precaution Comments: dense L hemipareisis with UE/LE tone, pushes to L, limited focus ?Restrictions ?Weight Bearing Restrictions: No ? ? ? ?Therapy/Group: Individual Therapy ? ?Helayne Seminole PT, DPT ? ?12/29/2021, 12:51 PM  ?

## 2021-12-29 NOTE — Progress Notes (Signed)
Patient ID: Patrick Rose, male   DOB: 11-26-1950, 71 y.o.   MRN: [id number] ? ?SW met with pt and pt wife to provide updates from team conference. Preferred SNF locations 1) [facility] and 2) [facility]. SW discussed SNF placement process. SW will f/u once more information.  ? ?1612- SW left message for Toni/[facility] to inquire if bed offer remains. SW waiting on follow-up. ? ?Loralee Pacas, MSW, LCSWA ?Office: [phone] ?Cell: [phone] ?Fax: [phone]  ?

## 2021-12-29 NOTE — Progress Notes (Signed)
Speech Language Pathology Daily Session Note ? ?Patient Details  ?Name: Patrick Rose ?MRN: [id number] ?Date of Birth: 1951-08-15 ? ?Today's Date: 12/29/2021 ?SLP Individual Time: 0800-0900 ?SLP Individual Time Calculation (min): 60 min ? ?Short Term Goals: ?Week 4: SLP Short Term Goal 1 (Week 4): Pt will consume recommended diet textures with s/sx concerning for aspiration <25% of the time and Mod A to implement safe swallow strategies. ?SLP Short Term Goal 2 (Week 4): Pt will complete functional basic problem-solving tasks with 80% accuracy given Min A. ?SLP Short Term Goal 3 (Week 4): Pt will attend to L visual field to complete table top tasks with 80% accuracy given visual anchor and Min vebral cues. ?SLP Short Term Goal 4 (Week 4): Patient will demonstrate sustained attention to functional tasks for 5 minutes with Mod verbal cues for redirection. ?SLP Short Term Goal 5 (Week 4): Patient will self-monitor and correct errors during functional tasks with Mod verbal cues. ? ?Skilled Therapeutic Interventions: Skilled treatment session focused on dysphagia and cognitive goals. Upon arrival, patient was awake in bed while consuming his breakfast meal of Dys. 2 textures with thin liquids. Patient consumed meal with minimal overt s/s of aspiration but required Mod verbal cues for a slow rate of self-feeding and Max A multimodal cues to self-monitor and correct left buccal pocketing and to attend to mastication of bolus. Recommend patient continue current diet. Patient recalled events from previous therapy sessions with decreased verbosity needed although cues needed to attend to self-feeding and to decrease initiation of conversation was needed. Patient performed oral care at end of session with decreased oral residuals noted. Patient left upright in bed with alarm on and all needs within reach. Continue with current plan of care.  ?   ? ?Pain ?Pain Assessment ?Pain Scale: 0-10 ?Pain Score: 0-No pain ? ?Therapy/Group:  Individual Therapy ? ?Abigal Choung ?12/29/2021, 10:02 AM ?

## 2021-12-29 NOTE — Plan of Care (Signed)
Downgraded d/t decision to d/c to SNF  ? ?Problem: RH Bathing ?Goal: LTG Patient will bathe all body parts with assist levels (OT) ?Description: LTG: Patient will bathe all body parts with assist levels (OT) ?Flowsheets (Taken 12/29/2021 1043) ?LTG: Pt will perform bathing with assistance level/cueing: (downgraded d/t SNF d/c) Maximal Assistance - Patient 25 - 49% ?  ?Problem: RH Toilet Transfers ?Goal: LTG Patient will perform toilet transfers w/assist (OT) ?Description: LTG: Patient will perform toilet transfers with assist, with/without cues using equipment (OT) ?Outcome: Not Applicable ?Flowsheets (Taken 12/29/2021 1043) ?LTG: Pt will perform toilet transfers with assistance level of: (d/c d/t SNF d/c) -- ?  ?Problem: Sit to Stand ?Goal: LTG:  Patient will perform sit to stand in prep for activites of daily living with assistance level (OT) ?Description: LTG:  Patient will perform sit to stand in prep for activites of daily living with assistance level (OT) ?Flowsheets (Taken 12/29/2021 1043) ?LTG: PT will perform sit to stand in prep for activites of daily living with assistance level: (downgraded d/t SNF d/c) Maximal Assistance - Patient 25 - 49% ?  ?

## 2021-12-30 DIAGNOSIS — I63511 Cerebral infarction due to unspecified occlusion or stenosis of right middle cerebral artery: Secondary | ICD-10-CM | POA: Diagnosis not present

## 2021-12-30 DIAGNOSIS — I482 Chronic atrial fibrillation, unspecified: Secondary | ICD-10-CM | POA: Diagnosis not present

## 2021-12-30 DIAGNOSIS — G8114 Spastic hemiplegia affecting left nondominant side: Secondary | ICD-10-CM | POA: Diagnosis not present

## 2021-12-30 DIAGNOSIS — D62 Acute posthemorrhagic anemia: Secondary | ICD-10-CM | POA: Diagnosis not present

## 2021-12-30 MED ORDER — BISACODYL 10 MG RE SUPP
10.0000 mg | Freq: Every day | RECTAL | Status: DC
  Administered 2021-12-31 – 2022-01-04 (×2): 10 mg via RECTAL
  Filled 2021-12-30 (×4): qty 1

## 2021-12-30 NOTE — Progress Notes (Signed)
Occupational Therapy Weekly Progress Note ? ?Patient Details  ?Name: Patrick Rose ?MRN: [id number] ?Date of Birth: 1950-12-22 ? ?Beginning of progress report period: December 23, 2021 ?End of progress report period: December 30, 2021 ? ?Today's Date: 12/30/2021 ?OT Individual Time: 3244-0102 ?OT Individual Time Calculation (min): 67 min  ? ? ?Patient has met 2 of 4 short term goals. Finnis continues to progress slowly with improvements in sitting balance, tone/spasticity management, motor planning, slight improvements in attention, and in standing balance. He still requires max A +2 for all mobility, mod A for UB ADLs and max A for LB ADLs. Hands on family education has not been completed as we are now planning on SNF d/c, but his wife Patrick Rose is always present and very supportive. She is able to carryover his LUE HEP for PROM.  ? ?Patient continues to demonstrate the following deficits: muscle weakness, impaired timing and sequencing, abnormal tone, unbalanced muscle activation, decreased coordination, and decreased motor planning, decreased attention to left, decreased initiation, decreased attention, decreased awareness, decreased problem solving, decreased safety awareness, decreased memory, and delayed processing, and decreased sitting balance, decreased standing balance, decreased postural control, hemiplegia, and decreased balance strategies and therefore will continue to benefit from skilled OT intervention to enhance overall performance with BADL and Reduce care partner burden. ? ?Patient not progressing toward long term goals.  See goal revision..  Plan of care revisions: Multiple goals downgraded to mod A to reflect SNF d/c . ? ?OT Short Term Goals ?Week 3:  OT Short Term Goal 1 (Week 3): Pt will sit EOB with mod A without UE support ?OT Short Term Goal 1 - Progress (Week 3): Met ?OT Short Term Goal 2 (Week 3): Pt will don a shirt with assist only to thread LUE ?OT Short Term Goal 2 - Progress (Week 3):  Progressing toward goal ?OT Short Term Goal 3 (Week 3): Pt will attend to simple grooming task with no more than mod cueing ?OT Short Term Goal 3 - Progress (Week 3): Met ?OT Short Term Goal 4 (Week 3): Pt will complete transfer with LRAD to the w/c with max A of 1 ?OT Short Term Goal 4 - Progress (Week 3): Not met ?Week 4:  OT Short Term Goal 1 (Week 4): Pt will don a shirt with min A ?OT Short Term Goal 2 (Week 4): Pt will complete bed mobility, rolling R and L with no more than mod A overall ?OT Short Term Goal 3 (Week 4): Pt will sit EOB with mod A without UE support ? ?Skilled Therapeutic Interventions/Progress Updates:  ?  Pt received supine with no c/o pain, agreeable to OT session. Initiated session with preparatory LUE/LLE PROM in all planes. Pt transitioned into L sidelying to maximize visual attention to his LUE as he attempted AAROM elbow flexion/ext. He had visible muscle activation but no active movement. He also had 2 instances of spasticity with stretching and reported a painful repsonse with the spasticity but it alleviated quickly. He transitioned to EOB with max A. EOB his sitting balance was drastically improved! He was able to maintain with (S) for several minutes after cueing for head/trunk alignment and with an anchor for his RUE. He completed a slideboard transfer to the R into the TIS w/c with max A of 1. He was taken to the therapy gym. Squat pivot transfer with total A +2. He used the EVA walker to complete 25 ft of functional mobility with assist required for LLE advancement, frequent  cueing for midline orientation, and trunk control. In standing he only required mod A for actual trunk control when he had min stability at the LLE. He demonstrated much improved sustained attention during mobility. He returned to his TIS and to his room. He transferred back to bed via max A +2 stand pivot (much better than squat pivot). He was left supine with NT alerted to need to change incontinent BM.  Wife present, bed alarm set.  ? ? ? ?Sa[place]o Stim One was placed on his L anterior wrist for wrist and finger flexors muscle activation and increasing L attention to his LUE. He reported no pain and had no adverse skin reactions.  ?330 pulse width ?35 Hz pulse rate ?On 8 sec/ off 8 sec ?Ramp up/ down 2 sec ?Symmetrical Biphasic wave form  ?Max intensity 150m at 500 Ohm load ?  ? ?Therapy Documentation ?Precautions:  ?Precautions ?Precautions: Fall ?Precaution Comments: dense L hemipareisis with UE/LE tone, pushes to L, limited focus ?Restrictions ?Weight Bearing Restrictions: No ? ?Therapy/Group: Individual Therapy ? ?SCurtis Sites?12/30/2021, 6:43 AM  ?

## 2021-12-30 NOTE — Progress Notes (Signed)
Patient ID: Patrick Rose, male   DOB: Apr 24, 1951, 71 y.o.   MRN: [id number] ? ?SW left message for Toni/[facility] to inquire if bed offer remains. SW waiting on follow-up. ? ?Cecile Sheerer, MSW, LCSWA ?Office: [phone] ?Cell: [phone] ?Fax: [phone]  ?

## 2021-12-30 NOTE — Progress Notes (Signed)
Speech Language Pathology Daily Session Note ? ?Patient Details  ?Name: Patrick Rose ?MRN: [id number] ?Date of Birth: 12/04/1950 ? ?Today's Date: 12/30/2021 ?SLP Individual Time: [id number] ?SLP Individual Time Calculation (min): 46 min ? ?Short Term Goals: ?Week 4: SLP Short Term Goal 1 (Week 4): Pt will consume recommended diet textures with s/sx concerning for aspiration <25% of the time and Mod A to implement safe swallow strategies. ?SLP Short Term Goal 2 (Week 4): Pt will complete functional basic problem-solving tasks with 80% accuracy given Min A. ?SLP Short Term Goal 3 (Week 4): Pt will attend to L visual field to complete table top tasks with 80% accuracy given visual anchor and Min vebral cues. ?SLP Short Term Goal 4 (Week 4): Patient will demonstrate sustained attention to functional tasks for 5 minutes with Mod verbal cues for redirection. ?SLP Short Term Goal 5 (Week 4): Patient will self-monitor and correct errors during functional tasks with Mod verbal cues. ? ?Skilled Therapeutic Interventions:Skilled ST services focused on swallow and cognitive skills. SLP facilitated PO intake of dys 2 textures and thin liquids via straw. Pt demonstrated ability to self-fed with set up assist and required min A verbal cues to clear mild left pocketing. Pt utilized liquid wash and use of a "sticky" bolus to aid in bolus cohesion. Pt also required min-mod A verbal cues to cease verbalization during mastication. SLP facilitated mildly complex problem solving and error awareness in ALFA daily math problems and during an organizational worksheet (organizing a closet.) Pt demonstrated accuracy in 6 out 7 daily math questions and required min A verbal cues for problem solving and mod A verbal cues for error awareness in organization worksheet. Pt expressed mental fog possibly due to change in medication. Pt was left in room with wife, call bell within reach and bed alarm set. SLP recommends to continue skilled services. ?    ? ?Pain ?Pain Assessment ?Pain Score: 0-No pain ? ?Therapy/Group: Individual Therapy ? ?Patrick Rose ?12/30/2021, 1:50 PM ?

## 2021-12-30 NOTE — Progress Notes (Signed)
Physical Therapy Session Note ? ?Patient Details  ?Name: Patrick Rose ?MRN: [id number] ?Date of Birth: [birth year] ? ?Today's Date: 12/30/2021 ?PT Individual Time: 0800-0900 ?PT Individual Time Calculation (min): 60 min  ? ?Short Term Goals: ?Week 1:  PT Short Term Goal 1 (Week 1): Pt will perform bed mobility with consistent MaxA and imrproved ability to follow instructions. ?PT Short Term Goal 1 - Progress (Week 1): Progressing toward goal ?PT Short Term Goal 2 (Week 1): Pt will perform sit<>stand with consistent Mod/ Max +2. ?PT Short Term Goal 2 - Progress (Week 1): Met ?PT Short Term Goal 3 (Week 1): Pt will perform seat to seat transfers with consistent MaxA +2 ?PT Short Term Goal 3 - Progress (Week 1): Progressing toward goal ?PT Short Term Goal 4 (Week 1): Pt will demonstrate improved balance with decreased pushing to L side to <50%. ?PT Short Term Goal 4 - Progress (Week 1): Met ?Week 2:  PT Short Term Goal 1 (Week 2): Pt will perform bed mobility with consistent MaxA and imrproved ability to follow instructions. ?PT Short Term Goal 1 - Progress (Week 2): Met ?PT Short Term Goal 2 (Week 2): Pt will perform sit<>stand with Max A using LRAD. ?PT Short Term Goal 3 (Week 2): Patient will perform sitting balance with supervision >2 min with mod cues for midline orientation. ?PT Short Term Goal 3 - Progress (Week 2): Progressing toward goal ?PT Short Term Goal 4 (Week 2): Patient will perform chair<>chair transfers with mod A +2. ?PT Short Term Goal 4 - Progress (Week 2): Progressing toward goal ?Week 3:  PT Short Term Goal 1 (Week 3): Patient will perform sitting balance with supervision >2 min with mod cues for midline orientation. ?PT Short Term Goal 2 (Week 3): Patient will perform chair<>chair transfers with mod A +2. ?PT Short Term Goal 3 (Week 3): Pt will perform sit<>stand with Max A using LRAD. ?PT Short Term Goal 4 (Week 3): Patient will perform bed mobility with mod A >50% of the time with use of  hospital bed features. ? ?Skilled Therapeutic Interventions/Progress Updates:  ?  pt received in bed and agreeable to therapy. Pt reports 2/10 pain in hip/back at rest, premedicated. Rest and positioning provided as needed. Session focused on Sit to stand and mid line orientation. Donned shorts in sitting with tot A and pulled over hips in standing, donned socks and shoes with tot A.  ? ?Bed mobility: ?Supine>sit with mod A for LLE and trunk management. Sit>supine with min A for LLE. Pt able to scoot to [employer] with min A x 1 to hold RLE in place while pushing through bed and pulling from headboard. Pt also able to lift hips and shift toward EOB with mod A x 1 and small lifts/scoots.  ? ?NMR: ?Pt performed Sit to stand x 4 with max +2 assist with HHA on RUE. Pt continues to demonstrate pushing toward L side. Pt required mod-max A knee block on LLE and tot A to maintain upright d/t pushing. Therapist provided knee block and facilitation into hip extension. Pt did well with cues to look up and use visual cues in the room to find midline in addition to tactile cues and manual facilitation. ? ?During seated rest breaks, worked on maintaining midline in sitting, with cues for using visual cues to maintain midline, with pt requiring CGA-mod A at times. Transitioned to targeted sitting balance training, with pt tapping therapists hand with RUE, requiring a reach outside BOS and  then to return to sitting. Pt required min A ~60% of attempts when reaching toward L side. At times, pt was able to reach out of BOS and return to midline with CGA only. Toward R side and forward pt able to return to midline >80% of attempts without assist.  ? ?Pt then returned to bed for stretching of hip flexors for tone management. Applied hot packs to hip flexor and quad for prolonged manual stretch x~4 min, pt able to achieve between 0-5 deg extension at the hip. After several minutes, pt had increase in tone, gentle stretching to return to neutral  hip extension before returning to bed.  ? ?Pt remained in bed after session with his wife present, was left with all needs in reach and alarm active.  ? ?Therapy Documentation ?Precautions:  ?Precautions ?Precautions: Fall ?Precaution Comments: dense L hemipareisis with UE/LE tone, pushes to L, limited focus ?Restrictions ?Weight Bearing Restrictions: No ?General: ?  ?  ? ? ? ?Therapy/Group: Individual Therapy ? ?[place] ?12/30/2021, 12:28 PM  ?

## 2021-12-30 NOTE — Progress Notes (Signed)
?                                                       PROGRESS NOTE ? ? ?Subjective/Complaints: ? ?Pt with restful night. No new symptoms today. Pain controlled ? ?ROS: Patient denies fever, rash, sore throat, blurred vision, dizziness, nausea, vomiting, diarrhea, cough, shortness of breath or chest pain, joint or back/neck pain, headache, or mood change.  ? ?Objective: ?  ?CT HEAD WO CONTRAST ( ) ? ?Result Date: 12/28/2021 ?CLINICAL DATA:  Stroke, follow up; new tingling along the left side of face since noon EXAM: CT HEAD WITHOUT CONTRAST TECHNIQUE: Contiguous axial images were obtained from the base of the skull through the vertex without intravenous contrast. RADIATION DOSE REDUCTION: This exam was performed according to the departmental dose-optimization program which includes automated exposure control, adjustment of the mA and/or kV according to patient size and/or use of iterative reconstruction technique. COMPARISON:  12/08/2021 FINDINGS: Brain: Evolution of right MCA territory infarction involving the frontal lobe with some parietal extension, basal ganglia, and insula. Similar evolution of right ACA territory infarction involving the frontal lobe. Edema and mass effect have significantly improved. Mildly increased density may reflect presence of petechial hemorrhage, but there is no discrete hematoma. There is no new loss of gray-white differentiation separate from above. Probable wallerian degeneration along the right corticospinal tract. Vascular: No new finding. Skull: Calvarium is unremarkable. Sinuses/Orbits: No acute finding. Other: None. IMPRESSION: Expected evolution of right MCA and ACA territory infarctions. There may be some petechial hemorrhage, but there is no discrete hematoma. Mass effect is significantly improved. Electronically Signed   By: Guadlupe Spanish M.D.   On: 12/28/2021 16:29   ?Recent Labs  ?  12/28/21 ?6568  ?WBC 6.7  ?HGB 14.2  ?HCT 42.7  ?PLT 215  ? ? ?Recent Labs  ?   12/28/21 ?1275  ?NA 137  ?K 4.2  ?CL 105  ?CO2 26  ?GLUCOSE 122*  ?BUN 16  ?CREATININE 0.87  ?CALCIUM 9.2  ? ? ? ?Intake/Output Summary (Last 24 hours) at 12/30/2021 0835 ?Last data filed at 12/30/2021 0300 ?Gross per 24 hour  ?Intake 1140 ml  ?Output 2000 ml  ?Net -860 ml  ?  ? ?Pressure Injury 12/25/21 Coccyx Mid Stage 1 -  Intact skin with non-blanchable redness of a localized area usually over a bony prominence. redness (Active)  ?12/25/21 0507  ?Location: Coccyx  ?Location Orientation: Mid  ?Staging: Stage 1 -  Intact skin with non-blanchable redness of a localized area usually over a bony prominence.  ?Wound Description (Comments): redness  ?Present on Admission: No  ?   ?Pressure Injury 12/25/21 Buttocks Left;Lower Stage 1 -  Intact skin with non-blanchable redness of a localized area usually over a bony prominence. red (Active)  ?12/25/21 0508  ?Location: Buttocks  ?Location Orientation: Left;Lower  ?Staging: Stage 1 -  Intact skin with non-blanchable redness of a localized area usually over a bony prominence.  ?Wound Description (Comments): red  ?Present on Admission: No  ? ? ?Physical Exam: ? ? ? ? ? ? ?Vital Signs ?Blood pressure 124/66, pulse 63, temperature 97.7 ?F (36.5 ?C), temperature source Oral, resp. rate 18, height 6\' 1"  (1.854 m), weight 86.6 kg, SpO2 98 %. ?Constitutional: No distress . Vital signs reviewed. ?HEENT: NCAT, EOMI, oral membranes moist ?Neck: supple ?  Cardiovascular: RRR without murmur. No JVD    ?Respiratory/Chest: CTA Bilaterally without wheezes or rales. Normal effort    ?GI/Abdomen: BS +, non-tender, non-distended ?Ext: no clubbing, cyanosis, or edema ?Psych: pleasant and cooperative  ?Skin:  Has multiple bruises on right thigh upper extremities, right hip groin is purple some bruising. Redness buttocks, coccyx ?Neurological: alert and oriented. Fair insight and awareness. Normal language. Can follow simple commands left central 7 and left hemianopsia. Left cheek/perioral area  with sl decrease in sensation of light touch.  Dysarthric. Motor: LLU/LLE 0/5   LUE 1/4, LLE 1/4. LUE tone 2-3/4, LLE 2/4.  DTR's 3+ (left side).  --stable motor   ?Musculoskeletal: Mild left hip pain with PROM, no pain with palpation. ? ? ?Assessment/Plan: ?1. Functional deficits which require 3+ hours per day of interdisciplinary therapy in a comprehensive inpatient rehab setting. ?Physiatrist is providing close team supervision and 24 hour management of active medical problems listed below. ?Physiatrist and rehab team continue to assess barriers to discharge/monitor patient progress toward functional and medical goals ? ?Care Tool: ? ?Bathing ? Bathing activity did not occur: Safety/medical concerns ?Body parts bathed by patient: Chest, Abdomen, Front perineal area, Right upper leg, Left upper leg, Face, Left arm  ? Body parts bathed by helper: Right arm, Buttocks, Right lower leg, Left lower leg ?  ?  ?Bathing assist Assist Level: 2 Helpers ?  ?  ?Upper Body Dressing/Undressing ?Upper body dressing Upper body dressing/undressing activity did not occur (including orthotics): Safety/medical concerns ?What is the patient wearing?: Pull over shirt ?   ?Upper body assist Assist Level: Moderate Assistance - Patient 50 - 74% ?   ?Lower Body Dressing/Undressing ?Lower body dressing ? ? ?   ?What is the patient wearing?: Pants ? ?  ? ?Lower body assist Assist for lower body dressing: 2 Helpers ?   ? ?Toileting ?Toileting    ?Toileting assist Assist for toileting: 2 Helpers ?  ?  ?Transfers ?Chair/bed transfer ? ?Transfers assist ? Chair/bed transfer activity did not occur: Safety/medical concerns ? ?Chair/bed transfer assist level: 2 Helpers ?  ?  ?Locomotion ?Ambulation ? ? ?Ambulation assist ? ? Ambulation activity did not occur: Safety/medical concerns ? ?  ?  ?   ? ?Walk 10 feet activity ? ? ?Assist ? Walk 10 feet activity did not occur: Safety/medical concerns ? ?  ?   ? ?Walk 50 feet activity ? ? ?Assist Walk 50  feet with 2 turns activity did not occur: Safety/medical concerns ? ?  ?   ? ? ?Walk 150 feet activity ? ? ?Assist Walk 150 feet activity did not occur: Safety/medical concerns ? ?  ?  ?  ? ?Walk 10 feet on uneven surface  ?activity ? ? ?Assist Walk 10 feet on uneven surfaces activity did not occur: Safety/medical concerns ? ? ?  ?   ? ?Wheelchair ? ? ? ? ?Assist Is the patient using a wheelchair?: Yes ?Type of Wheelchair: Manual ?Wheelchair activity did not occur: Safety/medical concerns ? ?  ?   ? ? ?Wheelchair 50 feet with 2 turns activity ? ? ? ?Assist ? ?  ?Wheelchair 50 feet with 2 turns activity did not occur: Safety/medical concerns ? ? ?   ? ?Wheelchair 150 feet activity  ? ? ? ?Assist ? Wheelchair 150 feet activity did not occur: Safety/medical concerns ? ? ?   ? ?Blood pressure 124/66, pulse 63, temperature 97.7 ?F (36.5 ?C), temperature source Oral, resp. rate 18, height 6'  1" (1.854 m), weight 86.6 kg, SpO2 98 %. ? ?Medical Problem List and Plan: ?1. Functional deficits secondary to right MCA infarct likely embolic d/t atrial fib. ?-Patient may shower. ? -ELOS/Goals:   min assist with PT, OT, SLP. ?  DC dispo Changed to SNF- ? -Continue CIR therapies including PT, OT, and SLP  ? -recent CT with expected evolution ?2.  Antithrombotics: ?-DVT/anticoagulation:  Eliquis started on 12/08/21.  Dopplers show no blockage. ?            -antiplatelet therapy:N/A ?-4/15 Continue Eliquis 5 mg daily. ?3. Pain Management for left hip pain: Continue encouraging use of foods from list that aid in pain management. ?-XR without evidence of fracture. ?-Continue focus on spasticity control and ROM. ?-Continue K pad use ?-Continue Tylenol prn and Voltaren gel as needed. ?   ?4. Mood: LCSW did follow for evaluation and support.   ?-Antipsychotic agents: N/A ?5. Neuropsych: Patient may be intermittently capable of making decisions on his own behalf. ? -increase ritalin to 15mg  bid for attention ?6. Skin/Wound Care: Routine skin  checks ?Hydrocortisone for rash on back.  Maculopapular rash is improving. ?7. Fluids/Electrolytes/Nutrition: encourage PO ?-I personally reviewed the patient's labs today.   ?8. A-fib: Monitor heart rate 3 ti

## 2021-12-31 DIAGNOSIS — G8114 Spastic hemiplegia affecting left nondominant side: Secondary | ICD-10-CM | POA: Diagnosis not present

## 2021-12-31 DIAGNOSIS — I63511 Cerebral infarction due to unspecified occlusion or stenosis of right middle cerebral artery: Secondary | ICD-10-CM | POA: Diagnosis not present

## 2021-12-31 DIAGNOSIS — I482 Chronic atrial fibrillation, unspecified: Secondary | ICD-10-CM | POA: Diagnosis not present

## 2021-12-31 DIAGNOSIS — D62 Acute posthemorrhagic anemia: Secondary | ICD-10-CM | POA: Diagnosis not present

## 2021-12-31 MED ORDER — TIZANIDINE HCL 2 MG PO TABS
2.0000 mg | ORAL_TABLET | Freq: Two times a day (BID) | ORAL | Status: DC
  Administered 2021-12-31 – 2022-01-04 (×8): 2 mg via ORAL
  Filled 2021-12-31 (×8): qty 1

## 2021-12-31 MED ORDER — PANTOPRAZOLE SODIUM 40 MG PO TBEC
40.0000 mg | DELAYED_RELEASE_TABLET | Freq: Every day | ORAL | Status: DC
  Administered 2021-12-31 – 2022-01-04 (×5): 40 mg via ORAL
  Filled 2021-12-31 (×6): qty 1

## 2021-12-31 NOTE — Progress Notes (Signed)
Occupational Therapy Session Note ? ?Patient Details  ?Name: Patrick Rose ?MRN: [id number] ?Date of Birth: 21-Jan-1951 ? ?Today's Date: 12/31/2021 ?OT Individual Time: 1400-1500 ?OT Individual Time Calculation (min): 60 min  ? ? ?Short Term Goals: ?Week 3:  OT Short Term Goal 1 (Week 3): Pt will sit EOB with mod A without UE support ?OT Short Term Goal 1 - Progress (Week 3): Met ?OT Short Term Goal 2 (Week 3): Pt will don a shirt with assist only to thread LUE ?OT Short Term Goal 2 - Progress (Week 3): Progressing toward goal ?OT Short Term Goal 3 (Week 3): Pt will attend to simple grooming task with no more than mod cueing ?OT Short Term Goal 3 - Progress (Week 3): Met ?OT Short Term Goal 4 (Week 3): Pt will complete transfer with LRAD to the w/c with max A of 1 ?OT Short Term Goal 4 - Progress (Week 3): Not met ? ?Skilled Therapeutic Interventions/Progress Updates:  ?  1:1 Pt received in bed and came to EOB with max A +2 for postural support as assisting scoot to EOB. With Facilitation to position his head at midline pt was able to sit with min guard. Performed stand pivot with having him stand first and then reach for arm rest of w/c to his right and then step with max support to his left knee. Pt was able weight shift towards his right with extra time. Showered in the zero entry shower room. Continued to practiced transfers in this manner from w/c to [facility] in the shower and then back to the w/c after the shower. Pt transferred with AFO donned each time. Pt was seated in the shower facing outward; BSC was a standard one which provide closer space for environmental cues for balance. Pt was able to assist with bathing with mod A; washing thighs, front periarea, chest, left UE, and face. Pt able to don shirt with setup and min A for dressing left Ue and A to lean forward and total to pull it down. Total A +2 for threading pants and pulling up.  Used the grab bar with yellow band as a environmental cue to help weight  shift to the right. ? ?Pt left sitting up in the chair at the end of session ; tilted back. ? ?Therapy Documentation ?Precautions:  ?Precautions ?Precautions: Fall ?Precaution Comments: dense L hemipareisis with UE/LE tone, pushes to L, limited focus ?Restrictions ?Weight Bearing Restrictions: No ?General: ?  ?Vital Signs: ?Therapy Vitals ?Temp: 97.8 ?F (36.6 ?C) ?Pulse Rate: 72 ?Resp: 18 ?BP: 111/78 ?Patient Position (if appropriate): Lying ?Oxygen Therapy ?SpO2: 98 % ?O2 Device: Room Air ?Pain: ? No reports of pain other than bottom being tender when washing with rags ? ?Therapy/Group: Individual Therapy ? ?Nicoletta Ba ?12/31/2021, 3:37 PM ?

## 2021-12-31 NOTE — Progress Notes (Addendum)
Patient ID: MARWAN LIPE, male   DOB: [birth year], 71 y.o.   MRN: [id number] ? ?SW spoke with Toni/[facility] to inquire if bed offer remains. Asked SW to speak with [facility] liaison- Mariella Saa [phone]. SW left message for Selena Batten to discuss referral and waiting on follow-up. ? ?SW returned phone call to pt dtr Angie to answer questions with regard to SNF placement process. SW informed pursing SNF preferred locations at this time.  ? ?*SW spoke with Mariella Saa liaison who reported they are able to extend bed offer. Will submit insurance auth today and will f/u once there is more information.  ? ?Cecile Sheerer, MSW, LCSWA ?Office: [phone] ?Cell: [phone] ?Fax: [phone]  ?

## 2021-12-31 NOTE — Progress Notes (Signed)
Physical Therapy Session Note ? ?Patient Details  ?Name: Patrick Rose ?MRN: [id number] ?Date of Birth: 02/25/1951 ? ?Today's Date: 12/31/2021 ?PT Individual Time: 1000-1100 ?PT Individual Time Calculation (min): 60 min  ? ?Short Term Goals: ?Week 3:  PT Short Term Goal 1 (Week 3): Patient will perform sitting balance with supervision >2 min with mod cues for midline orientation. ?PT Short Term Goal 2 (Week 3): Patient will perform chair<>chair transfers with mod A +2. ?PT Short Term Goal 3 (Week 3): Pt will perform sit<>stand with Max A using LRAD. ?PT Short Term Goal 4 (Week 3): Patient will perform bed mobility with mod A >50% of the time with use of hospital bed features. ? ?Skilled Therapeutic Interventions/Progress Updates:  ?  Session focused on NMR during functional mobility to address bed mobility, functional dynamic sitting balanceduring dressing task of shirt and dynamic reaching activity later in session, transfers, sit <> stands, reorientation to midline, postural control re-training, attention, and stretching. ? ?Pt required max assist for bed mobility to come to EOB with cues for technique and follow through to task. Maintained dynamic sitting balance with supervision to max assist (when LOB occurs or pt begins to push to the L) while PT assisted with donning of shoes (and L AFO) and during dressing of shirt. Pt required max cues for attention and mod for sequencing. Performed slideboard transfer to TIS w/c with +2 assist for stabilization of w/c and assist with board placement, max physical assist for transfer and cues for hand placement and manual facilitation for anterior weightshift.  ? ?Performed squat pivot transfer with +2 assist to and from mat table with focus on anterior weightshift, controlled movement, postural control, and cues for hand placement/technique. On mat table, sat behind patient patient with therapy ball for tactile cue to pelvis and back and performed stretch to the anterior  chest/pecs and shoulders to open up and improve posture due to pt with flexed posture and tightness. Also worked on manually try to stretch through neck and upper traps to help with head alignment. Progressed to dynamic reaching activity to play giant connect 4 outside BOS and in between turns, focused on pt returning back to stretched position during rest breaks.  ? ?Sit <> stands from mat table with +2 assistance but overall max assist with focus on initiating anterior weightshift and then working on upright posture once in standing as pt with tendency to rotate through trunk and then push to the L despite mulitmodal cues. Pt is more aware of his "leaning" but continues to demonstrate decreased ability for carryover and continues to demonstrate impaired attention.  ? ?End of session set up in chair with safety belt donned and all needs in reach with wife present. ? ?Therapy Documentation ?Precautions:  ?Precautions ?Precautions: Fall ?Precaution Comments: dense L hemipareisis with UE/LE tone, pushes to L, limited focus ?Restrictions ?Weight Bearing Restrictions: No ? ?  ?Pain: ? C/o tightness and unrated pain in hip. Repositioned as able. ? ? ? ?Therapy/Group: Individual Therapy ? ?Tedd Sias ?Philip Aspen, PT, DPT, CBIS ? ?12/31/2021, 12:24 PM  ?

## 2021-12-31 NOTE — Progress Notes (Signed)
Speech Language Pathology Daily Session Note ? ?Patient Details  ?Name: Patrick Rose ?MRN: [id number] ?Date of Birth: 28-May-1951 ? ?Today's Date: 12/31/2021 ?SLP Individual Time: 0258-5277 ?SLP Individual Time Calculation (min): 60 min ? ?Short Term Goals: ?Week 4: SLP Short Term Goal 1 (Week 4): Pt will consume recommended diet textures with s/sx concerning for aspiration <25% of the time and Mod A to implement safe swallow strategies. ?SLP Short Term Goal 2 (Week 4): Pt will complete functional basic problem-solving tasks with 80% accuracy given Min A. ?SLP Short Term Goal 3 (Week 4): Pt will attend to L visual field to complete table top tasks with 80% accuracy given visual anchor and Min vebral cues. ?SLP Short Term Goal 4 (Week 4): Patient will demonstrate sustained attention to functional tasks for 5 minutes with Mod verbal cues for redirection. ?SLP Short Term Goal 5 (Week 4): Patient will self-monitor and correct errors during functional tasks with Mod verbal cues. ? ?Skilled Therapeutic Interventions: Skilled treatment session focused on cognitive goals. Upon arrival, patient was awake in bed and had been incontinent of bowel and bladder (male purwick not working appropriately). SLP facilitated session by providing Min verbal and questions cues for problem solving during bed mobility with peri care. SLP also facilitated session by providing overall Min A verbal and question cues for problem solving, sustained attention and left visual scanning during a complex medication management task in which patient had to identify medication administration errors. Overall, patient with improved attention and decreased verbosity throughout session. Patient left upright in bed with alarm on and all needs within reach. Continue with current plan of care. ?   ? ?Pain ?Intermittent left hip pain during bed mobility  ? ?Therapy/Group: Individual Therapy ? ?Laddie Naeem ?12/31/2021, 12:41 PM ?

## 2021-12-31 NOTE — Progress Notes (Signed)
?                                                       PROGRESS NOTE ? ? ?Subjective/Complaints: ? ?Pt had a good night. Says his nose feels a little numb now. Otherwise no new complaints ? ?ROS: Patient denies fever, rash, sore throat, blurred vision, dizziness, nausea, vomiting, diarrhea, cough, shortness of breath or chest pain,  headache, or mood change.  ? ? ? ?Objective: ?  ?No results found. ?No results for input(s): WBC, HGB, HCT, PLT in the last 72 hours. ? ? ?No results for input(s): NA, K, CL, CO2, GLUCOSE, BUN, CREATININE, CALCIUM in the last 72 hours. ? ? ? ?Intake/Output Summary (Last 24 hours) at 12/31/2021 1055 ?Last data filed at 12/31/2021 0901 ?Gross per 24 hour  ?Intake 960 ml  ?Output 2950 ml  ?Net -1990 ml  ?  ? ?Pressure Injury 12/25/21 Buttocks Left;Lower Stage 1 -  Intact skin with non-blanchable redness of a localized area usually over a bony prominence. red (Active)  ?12/25/21 0508  ?Location: Buttocks  ?Location Orientation: Left;Lower  ?Staging: Stage 1 -  Intact skin with non-blanchable redness of a localized area usually over a bony prominence.  ?Wound Description (Comments): red  ?Present on Admission: No  ?   ?Pressure Injury 12/30/21 Coccyx Lower Stage 2 -  Partial thickness loss of dermis presenting as a shallow open injury with a red, pink wound bed without slough. (Active)  ?12/30/21 0958  ?Location: Coccyx  ?Location Orientation: Lower  ?Staging: Stage 2 -  Partial thickness loss of dermis presenting as a shallow open injury with a red, pink wound bed without slough.  ?Wound Description (Comments):   ?Present on Admission:   ? ? ?Physical Exam ? ? ?Vital Signs ?Blood pressure 102/61, pulse 65, temperature 97.7 ?F (36.5 ?C), resp. rate 17, height 6\' 1"  (1.854 m), weight 86.6 kg, SpO2 95 %. ?Constitutional: No distress . Vital signs reviewed. ?HEENT: NCAT, EOMI, oral membranes moist ?Neck: supple ?Cardiovascular: RRR without murmur. No JVD    ?Respiratory/Chest: CTA Bilaterally  without wheezes or rales. Normal effort    ?GI/Abdomen: BS +, non-tender, non-distended ?Ext: no clubbing, cyanosis, or edema ?Psych: pleasant and cooperative  ?Skin:  Has multiple bruises on right thigh upper extremities, right hip groin is purple some bruising. Redness buttocks, coccyx ?Neurological: alert and oriented. Fair insight and awareness. Normal language. Can follow simple commands left central 7 and left hemianopsia. Left cheek/perioral area with sl decrease in sensation of light touch.  Dysarthric. Motor: LLU/LLE 0/5   LUE 1/4, LLE 1/4. LUE tone 2-3/4, LLE 2/4.  DTR's 3+ (left side).   Tone generally increased today in LUE and LLE  ?Musculoskeletal: Mild left hip pain with PROM, no pain with palpation. ? ? ?Assessment/Plan: ?1. Functional deficits which require 3+ hours per day of interdisciplinary therapy in a comprehensive inpatient rehab setting. ?Physiatrist is providing close team supervision and 24 hour management of active medical problems listed below. ?Physiatrist and rehab team continue to assess barriers to discharge/monitor patient progress toward functional and medical goals ? ?Care Tool: ? ?Bathing ? Bathing activity did not occur: Safety/medical concerns ?Body parts bathed by patient: Chest, Abdomen, Front perineal area, Right upper leg, Left upper leg, Face, Left arm  ? Body parts bathed by helper: Right  arm, Buttocks, Right lower leg, Left lower leg ?  ?  ?Bathing assist Assist Level: 2 Helpers ?  ?  ?Upper Body Dressing/Undressing ?Upper body dressing Upper body dressing/undressing activity did not occur (including orthotics): Safety/medical concerns ?What is the patient wearing?: Pull over shirt ?   ?Upper body assist Assist Level: Moderate Assistance - Patient 50 - 74% ?   ?Lower Body Dressing/Undressing ?Lower body dressing ? ? ?   ?What is the patient wearing?: Pants ? ?  ? ?Lower body assist Assist for lower body dressing: 2 Helpers ?   ? ?Toileting ?Toileting    ?Toileting assist  Assist for toileting: 2 Helpers ?  ?  ?Transfers ?Chair/bed transfer ? ?Transfers assist ? Chair/bed transfer activity did not occur: Safety/medical concerns ? ?Chair/bed transfer assist level: 2 Helpers ?  ?  ?Locomotion ?Ambulation ? ? ?Ambulation assist ? ? Ambulation activity did not occur: Safety/medical concerns ? ?  ?  ?   ? ?Walk 10 feet activity ? ? ?Assist ? Walk 10 feet activity did not occur: Safety/medical concerns ? ?  ?   ? ?Walk 50 feet activity ? ? ?Assist Walk 50 feet with 2 turns activity did not occur: Safety/medical concerns ? ?  ?   ? ? ?Walk 150 feet activity ? ? ?Assist Walk 150 feet activity did not occur: Safety/medical concerns ? ?  ?  ?  ? ?Walk 10 feet on uneven surface  ?activity ? ? ?Assist Walk 10 feet on uneven surfaces activity did not occur: Safety/medical concerns ? ? ?  ?   ? ?Wheelchair ? ? ? ? ?Assist Is the patient using a wheelchair?: Yes ?Type of Wheelchair: Manual ?Wheelchair activity did not occur: Safety/medical concerns ? ?  ?   ? ? ?Wheelchair 50 feet with 2 turns activity ? ? ? ?Assist ? ?  ?Wheelchair 50 feet with 2 turns activity did not occur: Safety/medical concerns ? ? ?   ? ?Wheelchair 150 feet activity  ? ? ? ?Assist ? Wheelchair 150 feet activity did not occur: Safety/medical concerns ? ? ?   ? ?Blood pressure 102/61, pulse 65, temperature 97.7 ?F (36.5 ?C), resp. rate 17, height 6\' 1"  (1.854 m), weight 86.6 kg, SpO2 95 %. ? ?Medical Problem List and Plan: ?1. Functional deficits secondary to right MCA infarct likely embolic d/t atrial fib. ?-Patient may shower. ? -ELOS/Goals:   min assist with PT, OT, SLP. ?  DC dispo Changed to SNF- ? -Continue CIR therapies including PT, OT, and SLP   ? -recent CT with expected evolution ?2.  Antithrombotics: ?-DVT/anticoagulation:  Eliquis started on 12/08/21.  Dopplers show no blockage. ?            -antiplatelet therapy:N/A ?-4/15 Continue Eliquis 5 mg daily. ?3. Pain Management for left hip pain: Continue encouraging use  of foods from list that aid in pain management. ?-XR without evidence of fracture. ?-Continue focus on spasticity control and ROM. ?-Continue K pad use ?-Continue Tylenol prn and Voltaren gel as needed. ?-he finds ROM with PT quite helpful for hip   ?4. Mood: LCSW did follow for evaluation and support.   ?-Antipsychotic agents: N/A ?5. Neuropsych: Patient may be intermittently capable of making decisions on his own behalf. ? -increase ritalin to 15mg  bid for attention ?6. Skin/Wound Care: Routine skin checks ?Hydrocortisone for rash on back.  Maculopapular rash is improving. ?7. Fluids/Electrolytes/Nutrition: encourage PO ?-I personally reviewed the patient's labs today.   ?8. A-fib: Monitor heart  rate 3 times daily heart rate continues to remain in the 90s. ?4/26- HR 60s- co't regimen ?-Monitor for symptoms with activity. ?9. Dysphagia: diet upgraded to D2/thins ?10. Hyperlipidemia for HDL continue Crestor 20 mg. ?11.  Constipation: Continue senna twice daily. ?-4/7 multiple BMs after sorbitol. ?-4/11.  Stop Senna since patient's stools are pasty and frequent and he is incontinent -4/15 Pt reports normal BM today. ?4/17- LBM this AM- mushy- incontinent- con't regimen and monitor- due to stroke, likely why incontinent ?4/18- LBM yesterday ?4/27 started evening suppository last night--had 2 bms ? -still had incontinent bm this morning.  ? -hopefully we can regulate him to evenings ?12.acute blood loss anemia: Hemoglobin down from 14.4-12.2. ?- Monitor for signs of bleeding. ?- 4/5 Hgb up to 13.8 ?--4/10 Hemoglobin 13.4 ?13.  History of cervical DDD with cord compression: S\P ACDF with residual numbness in bilateral hands. ?14.  Lumbar stenosis: S\P decompression January 2023. ?- 4/11 X-rays of lumbar spine was order because patient had a recent fall and is worried about her hardware. ?- No acute osseous abnormality identified.  Interval L4-5 fusion. ?15.  Impaired Fasting glucose: Hemoglobin A1c-5.4. ?-Not currently  on medication. ?-Continue to trend and monitor. ?16.  Transaminitis: Mild elevation on 4 5. ?Likely reactive with further follow-up. ?4/10 LFTs have returned to baseline. ?17.  Spastic left hemiparesis

## 2022-01-01 NOTE — Progress Notes (Signed)
?                                                       PROGRESS NOTE ? ? ?Subjective/Complaints: ? ?No new issues. Working on bed to w/c transfer with OT when I arrived this am. ? ?ROS: Patient denies fever, rash, sore throat, blurred vision, dizziness, nausea, vomiting, diarrhea, cough, shortness of breath or chest pain, j  headache, or mood change.  ? ? ? ?Objective: ?  ?No results found. ?No results for input(s): WBC, HGB, HCT, PLT in the last 72 hours. ? ? ?No results for input(s): NA, K, CL, CO2, GLUCOSE, BUN, CREATININE, CALCIUM in the last 72 hours. ? ? ? ?Intake/Output Summary (Last 24 hours) at 01/01/2022 1136 ?Last data filed at 01/01/2022 0600 ?Gross per 24 hour  ?Intake 720 ml  ?Output 1175 ml  ?Net -455 ml  ?  ? ?Pressure Injury 12/25/21 Buttocks Left;Lower Stage 1 -  Intact skin with non-blanchable redness of a localized area usually over a bony prominence. red (Active)  ?12/25/21 0508  ?Location: Buttocks  ?Location Orientation: Left;Lower  ?Staging: Stage 1 -  Intact skin with non-blanchable redness of a localized area usually over a bony prominence.  ?Wound Description (Comments): red  ?Present on Admission: No  ?   ?Pressure Injury 12/30/21 Coccyx Lower Stage 2 -  Partial thickness loss of dermis presenting as a shallow open injury with a red, pink wound bed without slough. (Active)  ?12/30/21 0958  ?Location: Coccyx  ?Location Orientation: Lower  ?Staging: Stage 2 -  Partial thickness loss of dermis presenting as a shallow open injury with a red, pink wound bed without slough.  ?Wound Description (Comments):   ?Present on Admission:   ? ? ?Physical Exam ? ? ?Vital Signs ?Blood pressure (!) 117/59, pulse 68, temperature 98.2 ?F (36.8 ?C), resp. rate 17, height 6\' 1"  (1.854 m), weight 86.6 kg, SpO2 96 %. ?Constitutional: No distress . Vital signs reviewed. ?HEENT: NCAT, EOMI, oral membranes moist ?Neck: supple ?Cardiovascular: RRR without murmur. No JVD    ?Respiratory/Chest: CTA Bilaterally without  wheezes or rales. Normal effort    ?GI/Abdomen: BS +, non-tender, non-distended ?Ext: no clubbing, cyanosis, or edema ?Psych: pleasant and cooperative  ?Skin:  Has multiple bruises on right thigh upper extremities, right hip groin is purple some bruising. Redness buttocks, coccyx ?Neurological: alert and oriented. Fair insight and awareness. Normal language. Can follow simple commands left central 7 and left hemianopsia. Left cheek/perioral area with sl decrease in sensation of light touch.  Dysarthric. Motor: LLU/LLE 0/5    LLE tone 1+/4. LUE tone 2/4--better today  DTR's 3+ (left side).     ?Musculoskeletal: Mild left hip pain with PROM, no pain with palpation. ? ? ?Assessment/Plan: ?1. Functional deficits which require 3+ hours per day of interdisciplinary therapy in a comprehensive inpatient rehab setting. ?Physiatrist is providing close team supervision and 24 hour management of active medical problems listed below. ?Physiatrist and rehab team continue to assess barriers to discharge/monitor patient progress toward functional and medical goals ? ?Care Tool: ? ?Bathing ? Bathing activity did not occur: Safety/medical concerns ?Body parts bathed by patient: Chest, Abdomen, Front perineal area, Right upper leg, Left upper leg, Face, Left arm  ? Body parts bathed by helper: Right arm, Buttocks, Right lower leg, Left lower  leg ?  ?  ?Bathing assist Assist Level: 2 Helpers ?  ?  ?Upper Body Dressing/Undressing ?Upper body dressing Upper body dressing/undressing activity did not occur (including orthotics): Safety/medical concerns ?What is the patient wearing?: Pull over shirt ?   ?Upper body assist Assist Level: Moderate Assistance - Patient 50 - 74% ?   ?Lower Body Dressing/Undressing ?Lower body dressing ? ? ?   ?What is the patient wearing?: Pants ? ?  ? ?Lower body assist Assist for lower body dressing: 2 Helpers ?   ? ?Toileting ?Toileting    ?Toileting assist Assist for toileting: 2 Helpers ?  ?   ?Transfers ?Chair/bed transfer ? ?Transfers assist ? Chair/bed transfer activity did not occur: Safety/medical concerns ? ?Chair/bed transfer assist level: 2 Helpers ?  ?  ?Locomotion ?Ambulation ? ? ?Ambulation assist ? ? Ambulation activity did not occur: Safety/medical concerns ? ?  ?  ?   ? ?Walk 10 feet activity ? ? ?Assist ? Walk 10 feet activity did not occur: Safety/medical concerns ? ?  ?   ? ?Walk 50 feet activity ? ? ?Assist Walk 50 feet with 2 turns activity did not occur: Safety/medical concerns ? ?  ?   ? ? ?Walk 150 feet activity ? ? ?Assist Walk 150 feet activity did not occur: Safety/medical concerns ? ?  ?  ?  ? ?Walk 10 feet on uneven surface  ?activity ? ? ?Assist Walk 10 feet on uneven surfaces activity did not occur: Safety/medical concerns ? ? ?  ?   ? ?Wheelchair ? ? ? ? ?Assist Is the patient using a wheelchair?: Yes ?Type of Wheelchair: Manual ?Wheelchair activity did not occur: Safety/medical concerns ? ?  ?   ? ? ?Wheelchair 50 feet with 2 turns activity ? ? ? ?Assist ? ?  ?Wheelchair 50 feet with 2 turns activity did not occur: Safety/medical concerns ? ? ?   ? ?Wheelchair 150 feet activity  ? ? ? ?Assist ? Wheelchair 150 feet activity did not occur: Safety/medical concerns ? ? ?   ? ?Blood pressure (!) 117/59, pulse 68, temperature 98.2 ?F (36.8 ?C), resp. rate 17, height 6\' 1"  (1.854 m), weight 86.6 kg, SpO2 96 %. ? ?Medical Problem List and Plan: ?1. Functional deficits secondary to right MCA infarct likely embolic d/t atrial fib. ?-Patient may shower. ? -ELOS/Goals:   min assist with PT, OT, SLP. ?  DC dispo Changed to SNF- ? -Continue CIR therapies including PT, OT, and SLP  ? -recent CT with expected evolution of stroke ?2.  Antithrombotics: ?-DVT/anticoagulation:  Eliquis started on 12/08/21.  Dopplers show no blockage. ?            -antiplatelet therapy:N/A ?-4/15 Continue Eliquis 5 mg daily. ?3. Pain Management for left hip pain: Continue encouraging use of foods from list that  aid in pain management. ?-XR without evidence of fracture. ?-Continue focus on spasticity control and ROM. ?-Continue K pad use ?-Continue Tylenol prn and Voltaren gel as needed. ?-ROM with PT helpful   ?4. Mood: LCSW did follow for evaluation and support.   ?-Antipsychotic agents: N/A ?5. Neuropsych: Patient may be intermittently capable of making decisions on his own behalf. ? -increased ritalin to 15mg  bid for attention, continue amantadine for now also ?6. Skin/Wound Care: Routine skin checks ?Hydrocortisone for rash on back.  Maculopapular rash is improving. ?7. Fluids/Electrolytes/Nutrition: encourage PO ?-I personally reviewed the patient's labs today.   ?8. A-fib: Monitor heart rate 3 times daily heart  rate continues to remain in the 90s. ?4/28- HR 60s- co't regimen ?-Monitor for symptoms with activity. ?9. Dysphagia: diet upgraded to D2/thins ?10. Hyperlipidemia for HDL continue Crestor 20 mg. ?11.  Constipation: Continue senna twice daily. ?-4/7 multiple BMs after sorbitol. ?-4/11.  Stop Senna since patient's stools are pasty and frequent and he is incontinent -4/15 Pt reports normal BM today. ?4/17- LBM this AM- mushy- incontinent- con't regimen and monitor- due to stroke, likely why incontinent ?4/18- LBM yesterday ?4/28 receiving evening suppository@ 2000 ? -seems to be falling into a schedule with BM's,   ? -had large formed bm's x2 yesterday evening ? -trying to keep dry during daytime activities ?12.acute blood loss anemia: Hemoglobin down from 14.4-12.2. ?- Monitor for signs of bleeding. ?- 4/5 Hgb up to 13.8 ?--4/10 Hemoglobin 13.4 ?13.  History of cervical DDD with cord compression: S\P ACDF with residual numbness in bilateral hands. ?14.  Lumbar stenosis: S\P decompression January 2023. ?- 4/11 X-rays of lumbar spine was order because patient had a recent fall and is worried about her hardware. ?- No acute osseous abnormality identified.  Interval L4-5 fusion. ?15.  Impaired Fasting glucose:  Hemoglobin A1c-5.4. ?-Not currently on medication. ?-Continue to trend and monitor. ?16.  Transaminitis: Mild elevation on 4 5. ?Likely reactive with further follow-up. ?4/10 LFTs have returned to baseline. ?17.  Spasti

## 2022-01-01 NOTE — Progress Notes (Signed)
Physical Therapy Session Note ? ?Patient Details  ?Name: Patrick Rose ?MRN: [id number] ?Date of Birth: 03/18/1951 ? ?Today's Date: 01/01/2022 ?PT Individual Time: 2595-6387 ?PT Individual Time Calculation (min): 11 min  ? ?Short Term Goals: ?Week 3:  PT Short Term Goal 1 (Week 3): Patient will perform sitting balance with supervision >2 min with mod cues for midline orientation. ?PT Short Term Goal 2 (Week 3): Patient will perform chair<>chair transfers with mod A +2. ?PT Short Term Goal 3 (Week 3): Pt will perform sit<>stand with Max A using LRAD. ?PT Short Term Goal 4 (Week 3): Patient will perform bed mobility with mod A >50% of the time with use of hospital bed features. ? ?Skilled Therapeutic Interventions/Progress Updates: Pt seen by this PTA per nsg request to assist with transfer to bed. Pt in [place] with wife present. Pt set up to perform Slide board transfer to L. Pt required total A for Slide board set up. PTA placed RUE on pt's lap to decrease pushing during transfer. Pt instructed to push with LE only. During first scoot pt immediately pushing from arm rest and noted increased posterior lean. PTA repositioned pt and used bobath technique to block posterior push. Pt then transferred to bed with maxA x 1 and nsg present to stabilize TIS. Pt noted to have leaking catheter therefore total A for sit to supine transfer. Pt handed off to nsg and wife with current needs met.  ?   ? ?Therapy Documentation ?Precautions:  ?Precautions ?Precautions: Fall ?Precaution Comments: dense L hemipareisis with UE/LE tone, pushes to L, limited focus ?Restrictions ?Weight Bearing Restrictions: No ?General: ?  ?Vital Signs: ?Therapy Vitals ?Temp: 97.8 ?F (36.6 ?C) ?Pulse Rate: 71 ?Resp: 18 ?BP: (!) 125/55 ?Patient Position (if appropriate): Sitting ?Oxygen Therapy ?SpO2: 98 % ?O2 Device: Room Air ?Pain: ?  ?Mobility: ?  ?Locomotion : ?   ?Trunk/Postural Assessment : ?   ?Balance: ?  ?Exercises: ?  ?Other Treatments:    ? ? ? ?Therapy/Group: Individual Therapy ? ?Oran Dillenburg ?01/01/2022, 4:41 PM  ?

## 2022-01-01 NOTE — Progress Notes (Signed)
Occupational Therapy Session Note ? ?Patient Details  ?Name: Patrick Rose ?MRN: [id number] ?Date of Birth: 15-Oct-1950 ? ?Today's Date: 01/01/2022 ?OT Individual Time: 0800-0900 ?OT Individual Time Calculation (min): 60 min  ? ? ?Short Term Goals: ?Week 1:  OT Short Term Goal 1 (Week 1): Pt will maintain static sitting balance with max cueing and mod A ?OT Short Term Goal 1 - Progress (Week 1): Met ?OT Short Term Goal 2 (Week 1): Pt will don a shirt with mod A ?OT Short Term Goal 2 - Progress (Week 1): Progressing toward goal ?OT Short Term Goal 3 (Week 1): Pt will complete a sit <> stand with LRAD with max A of 1 ?OT Short Term Goal 3 - Progress (Week 1): Progressing toward goal ?OT Short Term Goal 4 (Week 1): Pt will demo improved sustained attention, requiring only mod cueing to attend to one grooming tasks ?OT Short Term Goal 4 - Progress (Week 1): Progressing toward goal ? ?Skilled Therapeutic Interventions/Progress Updates:  ?   ?Pt received in bed with intermittent L hip pain with spasm and hip flexion for rolling. Repositioning provided.  ?ADL: ?Pt completes ADL at overall MAX+2 A for LB bed Level and MOD A for UB in chair using shoot technique for hemi dressing. Skilled interventions include: pt completes rolling with max VC for hand placement d/t perseveration of RUE reaching and holding onto bed rails, total A for sup>sit with improved sitting balance at EOB with cuing for head positioning to improve midline orientation. Pt completes squat pivot transfers with MAX A+2 in both directions with cuing for hand placement and counting for initiation and attention. Total A for reciprocal scooting to back of TIS. Transported to tx gym to begin sitting balance task, however pt reporting need to toilet and returned to room/bed as stated before and placed on bed pan at end of session. Wife present to call NT when pt finished on bed pan.  ?Pt left at end of session in bed with exit alarm on, call light in reach and all  needs met ? ? ?Therapy Documentation ?Precautions:  ?Precautions ?Precautions: Fall ?Precaution Comments: dense L hemipareisis with UE/LE tone, pushes to L, limited focus ?Restrictions ?Weight Bearing Restrictions: No ?General: ? ? ? ?Therapy/Group: Individual Therapy ? ?Lowella Dell Ramzi Brathwaite ?01/01/2022, 6:51 AM ?

## 2022-01-01 NOTE — Progress Notes (Signed)
Patient ID: Patrick Rose, male   DOB: 1951/02/03, 71 y.o.   MRN: [id number] ? ?SW received phone call from pt dtr about SNF updates. SW informed on [facility] [place] submitted and waiting on updates from insurance. SW informed will follow-up once there is more information.   ? ?Cecile Sheerer, MSW, LCSWA ?Office: [phone] ?Cell: [phone] ?Fax: [phone]  ?

## 2022-01-01 NOTE — Progress Notes (Signed)
Physical Therapy Session Note ? ?Patient Details  ?Name: Patrick Rose ?MRN: [id number] ?Date of Birth: Mar 08, 1951 ? ?Today's Date: 01/01/2022 ?PT Individual Time: 6144-3154 ?PT Individual Time Calculation (min): 43 min  ? ?Short Term Goals: ?Week 3:  PT Short Term Goal 1 (Week 3): Patient will perform sitting balance with supervision >2 min with mod cues for midline orientation. ?PT Short Term Goal 2 (Week 3): Patient will perform chair<>chair transfers with mod A +2. ?PT Short Term Goal 3 (Week 3): Pt will perform sit<>stand with Max A using LRAD. ?PT Short Term Goal 4 (Week 3): Patient will perform bed mobility with mod A >50% of the time with use of hospital bed features. ?Week 4:    ? ?Skilled Therapeutic Interventions/Progress Updates:  ?Patient supine in bed on entrance to room. Wife present. Pt has been positioned in bed by nursing hooked to purewick and pants doffed.  Patient alert and agreeable to PT session.  ? ?Patient with no pain complaint throughout session. ? ?Therapeutic Activity: ?Bed Mobility: Patient performed bridging in bed with Mod/ Max A and hold to ankles in order to don shorts. Supine--> sit to L side with MaxA into upright seated position. Several instances of post and L sided LOB requiring MaxA to re-attain upright positioning.  ?Transfers: Patient performed slide board transfers throughout session toward R side with ModA. Pt is able to use RUE to pull and slide self across board with good effort and vc/ tc for technique. ? ?Neuromuscular Re-ed: ?NMR facilitated during session with focus onBLE muscle activation. Pt guided in AAROM for BLE and LLE only. Muscle tapping provided with conscious effort from pt in hip flexion and knee extension/ flexion. RLE guided in AROM extension into heel touch anteriorly to flexion and toe touch with foot under w/c. Then guided LLE into same motion with conscious practice from pt. No ataxia or co contraction noted. NMR performed for improvements in motor  control and coordination, balance, sequencing, judgement, and self confidence/ efficacy in performing all aspects of mobility at highest level of independence.  ? ?Patient taken to day room for sing-along with MD and staff at end of session. Pt parked with brakes locked, no alarm set as wife and staff present, pillow support to LUE, and all needs within reach. ? ? ?Therapy Documentation ?Precautions:  ?Precautions ?Precautions: Fall ?Precaution Comments: dense L hemipareisis with UE/LE tone, pushes to L, limited focus ?Restrictions ?Weight Bearing Restrictions: No ?General: ?  ?Vital Signs: ? ?Pain: ?Pain Assessment ?Pain Scale: 0-10 ?Pain Score: 0-No pain ? ?Therapy/Group: Individual Therapy ? ?Loel Dubonnet PT, DPT ?01/01/2022, 10:30 AM  ?

## 2022-01-01 NOTE — Progress Notes (Signed)
Speech Language Pathology Daily Session Note ? ?Patient Details  ?Name: SERAPIO EDELSON ?MRN: [id number] ?Date of Birth: [birth year] ? ?Today's Date: 01/01/2022 ?SLP Individual Time: 6222-9798 ?SLP Individual Time Calculation (min): 55 min ? ?Short Term Goals: ?Week 4: SLP Short Term Goal 1 (Week 4): Pt will consume recommended diet textures with s/sx concerning for aspiration <25% of the time and Mod A to implement safe swallow strategies. ?SLP Short Term Goal 2 (Week 4): Pt will complete functional basic problem-solving tasks with 80% accuracy given Min A. ?SLP Short Term Goal 3 (Week 4): Pt will attend to L visual field to complete table top tasks with 80% accuracy given visual anchor and Min vebral cues. ?SLP Short Term Goal 4 (Week 4): Patient will demonstrate sustained attention to functional tasks for 5 minutes with Mod verbal cues for redirection. ?SLP Short Term Goal 5 (Week 4): Patient will self-monitor and correct errors during functional tasks with Mod verbal cues. ? ?Skilled Therapeutic Interventions: Skilled treatment session focused on cognitive goals. SLP facilitated session by providing overall supervision level verbal cues for visual scanning and Mod verbal cues for emergent awareness of errors and sustained attention during a mildly complex appointment/calendar task. SLP also initiated a complex scheduling task in which patient had to create a "to-do" list of tasks that had to be completed throughout the day. Patient required Mod verbal cues for sustained attention to task and for accuracy. Task will be completed during next scheduled session. Patient left upright in bed with alarm on and all needs within reach. Continue with current plan of care.  ?   ? ?Pain ?No/Denies Pain  ? ?Therapy/Group: Individual Therapy ? ?Zaydn Gutridge ?01/01/2022, 12:17 PM ?

## 2022-01-02 NOTE — Progress Notes (Signed)
Physical Therapy Session Note ? ?Patient Details  ?Name: Patrick Rose ?MRN: [id number] ?Date of Birth: [birth year] ? ?Today's Date: 01/02/2022 ?PT Individual Time: [id number] ?PT Individual Time Calculation (min): 64 min  ? ?Short Term Goals: ?Week 3:  PT Short Term Goal 1 (Week 3): Patient will perform sitting balance with supervision >2 min with mod cues for midline orientation. ?PT Short Term Goal 2 (Week 3): Patient will perform chair<>chair transfers with mod A +2. ?PT Short Term Goal 3 (Week 3): Pt will perform sit<>stand with Max A using LRAD. ?PT Short Term Goal 4 (Week 3): Patient will perform bed mobility with mod A >50% of the time with use of hospital bed features. ? ?Skilled Therapeutic Interventions/Progress Updates:  ?  Pt received supine in bed with his family in the room visiting. Pt agreeable to therapy session. Pt soiled in urine due to leaking of male purewick - NT apologizes and states that pt/family declined her assistance with peri-care prior to therapist arrival because pt wanted to spend as much time visiting with family as possible.  ? ?Rolling R with total assist for trunk/pelvic rotation - noticed increased L hip pain if therapist facilitated near hamstrings when rotating hips because this caused increased hip flexion and less pain if therapist facilitated at the pelvis/gluteal region avoiding increased hip/knee flexion ROM while rolling. Rolling L with mod/max assist for trunk/pelvis rotation and pt using R UE support on bedrail to assist with trunk and R LE in hooklying to rotate pelvis. Dependent assist peri-care to ensure cleanliness and dependent assist brief management. Threaded shorts on over LEs with total assist and then pt able to use R UE to assist with pulling over hips after therapist manually facilitated and maintained L LE into hooklying position for pt to perform bridge for hip clearance - still requires mod/max assist to pull shorts up over L hip. ? ?Pt reports urge to  urinate - provided urinal with increased time but pt unable to void and then a few minutes later pt reports feeling the urge again but unable to place urinal quickly enough resulting in pt being incontinent in brief - performed peri-care and LB clothing management as just described. ? ?Donned B shoes and L LE Matrix Max GRAFO total assist.  ? ?Supine>sitting L EOB with mod assist for rolling and then max assist for trunk upright and L hemibody management. Sitting EOB initially requires heavy mod assist progressed to min assist while doffing dirty shirt and donning clean shirt with max assist. ? ?R squat pivot transfer EOB>w/c with heavy max assist of 1 (+2 present for safety) to lift and pivot hips - requires repeated verbal cuing to maintain anterior trunk lean, reach R UE across to opposite w/c armrest, and to lift hips while pivoting. ? ? Transported to/from hallway in w/c. ? ?Sit>stand from w/c with +2 max assist for lifting to stand and balance due to strong L lean and poor L LE hip/knee extension to come into standing - standing up to 3 Musketeer support. ? ?Pt wearing L LE Matrix Max GRAFO and leg lifter to facilitate increased L knee extension during stance phase and allow therapist to advance L LE during swing phase respectively. ? ?Gait training 59ft with +2 max assist using 3 Musketeer support and 3rd person providing w/c follow for safety ?- pt demos significant improvement in trunk/hip extension for upright posture compared to last time seen by this therapist although still very impaired and worsens with fatigue requiring +2  assist to provide increased facilitation at trunk to maintain upright towards end of walk ?- pt able to achieve increased L hip extension during stance to allow longer R LE step length for a reciprocal stepping pattern ?- continues to require max/total manual facilitation for L hip/knee extension during stance phase as well as total assist to advance L LE during swing ?- continues  to have L trunk lean although significantly improved since last time seen by this therapist ? ?Overall pt with improving attention to tasks throughout session, although still significantly impaired. ? ?Transported back to room in w/c. Nurse requesting pt be returned to bed to allow reapplication of male purewick due to incontinence. ? ?L squat pivot w/c>EOB with heavier max assist in this direction as pt has more difficulty motor planning this way compared to towards the R - +2 assist facilitating pt to maintain anterior trunk lean during transfer - repeated cuing to maintain R anterior trunk lean with this - pt with difficulty motor planning how to push through B LEs to clear hips.  ? ?Sit>supine via reverse logroll technique with light max/mod assist of 1 for trunk descent and L LE managaement onto bed (keeping the leg slightly extended to avoid L hip pain). Pt left supine in bed with needs in reach, bed alarm on, and nurse notified of pt's position. ? ? ? ? ?Therapy Documentation ?Precautions:  ?Precautions ?Precautions: Fall ?Precaution Comments: dense L hemipareisis with UE/LE tone, pushes to L, limited focus ?Restrictions ?Weight Bearing Restrictions: No ? ? ?Pain: ? Continues to have L hip pain, especially when rolling towards R  side - does better if therapist facilitates at pelvis for rolling as opposed to the back of the femur because that causes hip flexion which leads to more pain; however, if keep L LE more extended then pt has less pain. Pt denies L hip pain during gait training, which is a significant improvement compared to last time seen by this therapist. ? ? ?Therapy/Group: Individual Therapy ? ?Tawana Scale , PT, DPT, NCS, CSRS ?01/02/2022, 12:25 PM  ?

## 2022-01-02 NOTE — Progress Notes (Signed)
?                                                       PROGRESS NOTE ? ? ?Subjective/Complaints: ? ?Wife is asking about potential SNF due to heavy assist level  ? ?ROS: Patient denies fever, rash, sore throat, blurred vision, dizziness, nausea, vomiting, diarrhea, cough, ? ? ? ?Objective: ?  ?No results found. ?No results for input(s): WBC, HGB, HCT, PLT in the last 72 hours. ? ? ?No results for input(s): NA, K, CL, CO2, GLUCOSE, BUN, CREATININE, CALCIUM in the last 72 hours. ? ? ? ?Intake/Output Summary (Last 24 hours) at 01/02/2022 0907 ?Last data filed at 01/02/2022 0400 ?Gross per 24 hour  ?Intake 660 ml  ?Output 2450 ml  ?Net -1790 ml  ? ?  ? ?Pressure Injury 12/25/21 Buttocks Left;Lower Stage 1 -  Intact skin with non-blanchable redness of a localized area usually over a bony prominence. red (Active)  ?12/25/21 0508  ?Location: Buttocks  ?Location Orientation: Left;Lower  ?Staging: Stage 1 -  Intact skin with non-blanchable redness of a localized area usually over a bony prominence.  ?Wound Description (Comments): red  ?Present on Admission: No  ?   ?Pressure Injury 12/30/21 Coccyx Lower Stage 2 -  Partial thickness loss of dermis presenting as a shallow open injury with a red, pink wound bed without slough. (Active)  ?12/30/21 0958  ?Location: Coccyx  ?Location Orientation: Lower  ?Staging: Stage 2 -  Partial thickness loss of dermis presenting as a shallow open injury with a red, pink wound bed without slough.  ?Wound Description (Comments):   ?Present on Admission:   ? ? ?Physical Exam ? ? ?Vital Signs ?Blood pressure 116/64, pulse 67, temperature 98.1 ?F (36.7 ?C), resp. rate 17, height 6\' 1"  (1.854 m), weight 86.6 kg, SpO2 96 %. ? ?General: No acute distress ?Mood and affect are appropriate ?Heart: Regular rate and rhythm no rubs murmurs or extra sounds ?Lungs: Clear to auscultation, breathing unlabored, no rales or wheezes ?Abdomen: Positive bowel sounds, soft nontender to palpation,  nondistended ?Extremities: No clubbing, cyanosis, or edema ?Skin: No evidence of breakdown, no evidence of rash ? ? ? ?Psych: pleasant and cooperative  ?Skin:  Has multiple bruises on right thigh upper extremities, right hip groin is purple some bruising. Redness buttocks, coccyx ?Neurological: alert and oriented. Fair insight and awareness. Normal language. Can follow simple commands left central 7 and left hemianopsia. Left cheek/perioral area with sl decrease in sensation of light touch.  Dysarthric. Motor: LLU/LLE 0/5    LLE tone 1+/4. LUE tone 2/4--better today  DTR's 3+ (left side).     ?Musculoskeletal: Mild left hip pain with PROM, no pain with palpation. ? ? ?Assessment/Plan: ?1. Functional deficits which require 3+ hours per day of interdisciplinary therapy in a comprehensive inpatient rehab setting. ?Physiatrist is providing close team supervision and 24 hour management of active medical problems listed below. ?Physiatrist and rehab team continue to assess barriers to discharge/monitor patient progress toward functional and medical goals ? ?Care Tool: ? ?Bathing ? Bathing activity did not occur: Safety/medical concerns ?Body parts bathed by patient: Chest, Abdomen, Front perineal area, Right upper leg, Left upper leg, Face, Left arm  ? Body parts bathed by helper: Right arm, Buttocks, Right lower leg, Left lower leg ?  ?  ?Bathing  assist Assist Level: 2 Helpers ?  ?  ?Upper Body Dressing/Undressing ?Upper body dressing Upper body dressing/undressing activity did not occur (including orthotics): Safety/medical concerns ?What is the patient wearing?: Pull over shirt ?   ?Upper body assist Assist Level: Moderate Assistance - Patient 50 - 74% ?   ?Lower Body Dressing/Undressing ?Lower body dressing ? ? ?   ?What is the patient wearing?: Pants ? ?  ? ?Lower body assist Assist for lower body dressing: 2 Helpers ?   ? ?Toileting ?Toileting    ?Toileting assist Assist for toileting: 2 Helpers ?  ?   ?Transfers ?Chair/bed transfer ? ?Transfers assist ? Chair/bed transfer activity did not occur: Safety/medical concerns ? ?Chair/bed transfer assist level: 2 Helpers ?  ?  ?Locomotion ?Ambulation ? ? ?Ambulation assist ? ? Ambulation activity did not occur: Safety/medical concerns ? ?  ?  ?   ? ?Walk 10 feet activity ? ? ?Assist ? Walk 10 feet activity did not occur: Safety/medical concerns ? ?  ?   ? ?Walk 50 feet activity ? ? ?Assist Walk 50 feet with 2 turns activity did not occur: Safety/medical concerns ? ?  ?   ? ? ?Walk 150 feet activity ? ? ?Assist Walk 150 feet activity did not occur: Safety/medical concerns ? ?  ?  ?  ? ?Walk 10 feet on uneven surface  ?activity ? ? ?Assist Walk 10 feet on uneven surfaces activity did not occur: Safety/medical concerns ? ? ?  ?   ? ?Wheelchair ? ? ? ? ?Assist Is the patient using a wheelchair?: Yes ?Type of Wheelchair: Manual ?Wheelchair activity did not occur: Safety/medical concerns ? ?  ?   ? ? ?Wheelchair 50 feet with 2 turns activity ? ? ? ?Assist ? ?  ?Wheelchair 50 feet with 2 turns activity did not occur: Safety/medical concerns ? ? ?   ? ?Wheelchair 150 feet activity  ? ? ? ?Assist ? Wheelchair 150 feet activity did not occur: Safety/medical concerns ? ? ?   ? ?Blood pressure 116/64, pulse 67, temperature 98.1 ?F (36.7 ?C), resp. rate 17, height 6\' 1"  (1.854 m), weight 86.6 kg, SpO2 96 %. ? ?Medical Problem List and Plan: ?1. Functional deficits secondary to right MCA infarct likely embolic d/t atrial fib. ?-Patient may shower.Minimal return of function at ~40mo post CVA  ? -ELOS/Goals:   min assist with PT, OT, SLP. ?  DC dispo Changed to SNF-Wife instructed to reach out to SW on Monday  ? -Continue CIR therapies including PT, OT, and SLP  ? -recent CT with expected evolution of stroke ?2.  Antithrombotics: ?-DVT/anticoagulation:  Eliquis started on 12/08/21.  Dopplers show no blockage. ?            -antiplatelet therapy:N/A ?-4/15 Continue Eliquis 5 mg daily. ?3.  Pain Management for left hip pain: Continue encouraging use of foods from list that aid in pain management. ?-XR without evidence of fracture. ?-Continue focus on spasticity control and ROM. ?-Continue K pad use ?-Continue Tylenol prn and Voltaren gel as needed. ?-ROM with PT helpful   ?4. Mood: LCSW did follow for evaluation and support.   ?-Antipsychotic agents: N/A ?5. Neuropsych: Patient may be intermittently capable of making decisions on his own behalf. ? -increased ritalin to 15mg  bid for attention, continue amantadine for now also ?6. Skin/Wound Care: Routine skin checks ?Hydrocortisone for rash on back.  Maculopapular rash is improving. ?7. Fluids/Electrolytes/Nutrition: encourage PO ?-I personally reviewed the patient's labs today.   ?  8. A-fib: Monitor heart rate 3 times daily heart rate continues to remain in the 90s. ?4/28- HR 60s- co't regimen ?-Monitor for symptoms with activity. ?9. Dysphagia: diet upgraded to D2/thins ?10. Hyperlipidemia for HDL continue Crestor 20 mg. ?11.  Constipation: Continue senna twice daily. ?-4/7 multiple BMs after sorbitol. ?-4/11.  Stop Senna since patient's stools are pasty and frequent and he is incontinent -4/15 Pt reports normal BM today. ?4/17- LBM this AM- mushy- incontinent- con't regimen and monitor- due to stroke, likely why incontinent ?4/18- LBM yesterday ?4/28 receiving evening suppository@ 2000 ? -seems to be falling into a schedule with BM's,   ? -had large formed bm's x2 yesterday evening ? -trying to keep dry during daytime activities ?12.acute blood loss anemia: Hemoglobin down from 14.4-12.2. ?- Monitor for signs of bleeding. ?- 4/5 Hgb up to 13.8 ?--4/10 Hemoglobin 13.4 ?13.  History of cervical DDD with cord compression: S\P ACDF with residual numbness in bilateral hands. ?14.  Lumbar stenosis: S\P decompression January 2023. ?- 4/11 X-rays of lumbar spine was order because patient had a recent fall and is worried about her hardware. ?- No acute osseous  abnormality identified.  Interval L4-5 fusion. ?15.  Impaired Fasting glucose: Hemoglobin A1c-5.4. ?-Not currently on medication. ?-Continue to trend and monitor. ?16.  Transaminitis: Mild elevation on 4 5. ?Likely reactive with furt

## 2022-01-03 MED ORDER — MELATONIN 5 MG PO TABS
10.0000 mg | ORAL_TABLET | Freq: Every day | ORAL | Status: DC
  Administered 2022-01-03 – 2022-01-04 (×2): 10 mg via ORAL
  Filled 2022-01-03 (×2): qty 2

## 2022-01-03 NOTE — Progress Notes (Addendum)
?                                                       PROGRESS NOTE ? ? ?Subjective/Complaints: ? ?C/o poor sleep , tried trazodone, but used melatonin at home  ? ?ROS: Patient denies fever, rash, sore throat, blurred vision, dizziness, nausea, vomiting, diarrhea, cough, ? ? ? ?Objective: ?  ?No results found. ?No results for input(s): WBC, HGB, HCT, PLT in the last 72 hours. ? ? ?No results for input(s): NA, K, CL, CO2, GLUCOSE, BUN, CREATININE, CALCIUM in the last 72 hours. ? ? ? ?Intake/Output Summary (Last 24 hours) at 01/03/2022 0925 ?Last data filed at 01/03/2022 0400 ?Gross per 24 hour  ?Intake 440 ml  ?Output 201 ml  ?Net 239 ml  ? ?  ? ?Pressure Injury 12/25/21 Buttocks Left;Lower Stage 1 -  Intact skin with non-blanchable redness of a localized area usually over a bony prominence. red (Active)  ?12/25/21 0508  ?Location: Buttocks  ?Location Orientation: Left;Lower  ?Staging: Stage 1 -  Intact skin with non-blanchable redness of a localized area usually over a bony prominence.  ?Wound Description (Comments): red  ?Present on Admission: No  ?   ?Pressure Injury 12/30/21 Coccyx Lower Stage 2 -  Partial thickness loss of dermis presenting as a shallow open injury with a red, pink wound bed without slough. (Active)  ?12/30/21 0958  ?Location: Coccyx  ?Location Orientation: Lower  ?Staging: Stage 2 -  Partial thickness loss of dermis presenting as a shallow open injury with a red, pink wound bed without slough.  ?Wound Description (Comments):   ?Present on Admission:   ? ? ?Physical Exam ? ? ?Vital Signs ?Blood pressure (!) 109/57, pulse 67, temperature 98 ?F (36.7 ?C), temperature source Oral, resp. rate 17, height 6\' 1"  (1.854 m), weight 86.6 kg, SpO2 94 %. ? ?General: No acute distress ?Mood and affect are appropriate ?Heart: Regular rate and rhythm no rubs murmurs or extra sounds ?Lungs: Clear to auscultation, breathing unlabored, no rales or wheezes ?Abdomen: Positive bowel sounds, soft nontender to  palpation, nondistended ?Extremities: No clubbing, cyanosis, or edema ?Skin: No evidence of breakdown, no evidence of rash ? ? ? ?Psych: pleasant and cooperative  ?Skin:  Has multiple bruises on right thigh upper extremities, right hip groin is purple some bruising. Redness buttocks, coccyx ?Neurological: alert and oriented. Fair insight and awareness. Normal language. Can follow simple commands left central 7 and left hemianopsia. Left cheek/perioral area with sl decrease in sensation of light touch.  Dysarthric. Motor: LLU/LLE 0/5    LLE tone 1+/4. LUE tone 2/4--better today  DTR's 3+ (left side).     ?Musculoskeletal: Mild left hip pain with PROM, no pain with palpation. ? ? ?Assessment/Plan: ?1. Functional deficits which require 3+ hours per day of interdisciplinary therapy in a comprehensive inpatient rehab setting. ?Physiatrist is providing close team supervision and 24 hour management of active medical problems listed below. ?Physiatrist and rehab team continue to assess barriers to discharge/monitor patient progress toward functional and medical goals ? ?Care Tool: ? ?Bathing ? Bathing activity did not occur: Safety/medical concerns ?Body parts bathed by patient: Chest, Abdomen, Front perineal area, Right upper leg, Left upper leg, Face, Left arm  ? Body parts bathed by helper: Right arm, Buttocks, Right lower leg, Left lower leg ?  ?  ?  Bathing assist Assist Level: 2 Helpers ?  ?  ?Upper Body Dressing/Undressing ?Upper body dressing Upper body dressing/undressing activity did not occur (including orthotics): Safety/medical concerns ?What is the patient wearing?: Pull over shirt ?   ?Upper body assist Assist Level: Moderate Assistance - Patient 50 - 74% ?   ?Lower Body Dressing/Undressing ?Lower body dressing ? ? ?   ?What is the patient wearing?: Pants ? ?  ? ?Lower body assist Assist for lower body dressing: 2 Helpers ?   ? ?Toileting ?Toileting    ?Toileting assist Assist for toileting: 2 Helpers ?  ?   ?Transfers ?Chair/bed transfer ? ?Transfers assist ? Chair/bed transfer activity did not occur: Safety/medical concerns ? ?Chair/bed transfer assist level: 2 Helpers (squat pivot) ?  ?  ?Locomotion ?Ambulation ? ? ?Ambulation assist ? ? Ambulation activity did not occur: Safety/medical concerns ? ?Assist level: 2 helpers ?Assistive device: Other (comment) (3 Musketeer) ?Max distance: 40ft  ? ?Walk 10 feet activity ? ? ?Assist ? Walk 10 feet activity did not occur: Safety/medical concerns ? ?  ?   ? ?Walk 50 feet activity ? ? ?Assist Walk 50 feet with 2 turns activity did not occur: Safety/medical concerns ? ?  ?   ? ? ?Walk 150 feet activity ? ? ?Assist Walk 150 feet activity did not occur: Safety/medical concerns ? ?  ?  ?  ? ?Walk 10 feet on uneven surface  ?activity ? ? ?Assist Walk 10 feet on uneven surfaces activity did not occur: Safety/medical concerns ? ? ?  ?   ? ?Wheelchair ? ? ? ? ?Assist Is the patient using a wheelchair?: Yes ?Type of Wheelchair: Manual ?Wheelchair activity did not occur: Safety/medical concerns ? ?  ?   ? ? ?Wheelchair 50 feet with 2 turns activity ? ? ? ?Assist ? ?  ?Wheelchair 50 feet with 2 turns activity did not occur: Safety/medical concerns ? ? ?   ? ?Wheelchair 150 feet activity  ? ? ? ?Assist ? Wheelchair 150 feet activity did not occur: Safety/medical concerns ? ? ?   ? ?Blood pressure (!) 109/57, pulse 67, temperature 98 ?F (36.7 ?C), temperature source Oral, resp. rate 17, height 6\' 1"  (1.854 m), weight 86.6 kg, SpO2 94 %. ? ?Medical Problem List and Plan: ?1. Functional deficits secondary to right MCA infarct likely embolic d/t atrial fib. ?-Patient may shower.Minimal return of function at ~77mo post CVA  ? -ELOS/Goals:   min assist with PT, OT, SLP. ?  DC dispo Changed to SNF-Wife instructed to reach out to SW on Monday  ? -Continue CIR therapies including PT, OT, and SLP  ? -recent CT with expected evolution of stroke ?2.  Antithrombotics: ?-DVT/anticoagulation:  Eliquis  started on 12/08/21.  Dopplers show no blockage. ?            -antiplatelet therapy:N/A ?-4/15 Continue Eliquis 5 mg daily. ?3. Pain Management for left hip pain: Continue encouraging use of foods from list that aid in pain management. ?-XR without evidence of fracture. ?-Continue focus on spasticity control and ROM. ?-Continue K pad use ?-Continue Tylenol prn and Voltaren gel as needed. ?-ROM with PT helpful   ?4. Mood: LCSW did follow for evaluation and support.   ?-Antipsychotic agents: N/A ?5. Neuropsych: Patient may be intermittently capable of making decisions on his own behalf. ? -increased ritalin to 15mg  bid for attention, continue amantadine for now also ?6. Skin/Wound Care: Routine skin checks ?Hydrocortisone for rash on back.  Maculopapular rash  is improving. ?7. Fluids/Electrolytes/Nutrition: encourage PO ?-I personally reviewed the patient's labs today.   ?8. A-fib: Monitor heart rate 3 times daily heart rate continues to remain in the 90s. ?4/28- HR 60s- co't regimen ? ?Vitals:  ? 01/02/22 1930 01/03/22 0408  ?BP:  (!) 109/57  ?Pulse: 70 67  ?Resp:  17  ?Temp:  98 ?F (36.7 ?C)  ?SpO2: 100% 94%  ? ? ?9. Dysphagia: diet upgraded to D2/thins ?10. Hyperlipidemia for HDL continue Crestor 20 mg. ?11.  Constipation: Continue senna twice daily. ?-4/7 multiple BMs after sorbitol. ?-4/11.  Stop Senna since patient's stools are pasty and frequent and he is incontinent -4/15 Pt reports normal BM today. ?4/17- LBM this AM- mushy- incontinent- con't regimen and monitor- due to stroke, likely why incontinent ?4/18- LBM yesterday ?4/28 receiving evening suppository@ 2000 ? -seems to be falling into a schedule with BM's,   ? -had large formed bm's x2 yesterday evening ? -trying to keep dry during daytime activities ?12.acute blood loss anemia: Hemoglobin down from 14.4-12.2. ?- Monitor for signs of bleeding. ?- 4/5 Hgb up to 13.8 ?--4/10 Hemoglobin 13.4 ?13.  History of cervical DDD with cord compression: S\P ACDF with  residual numbness in bilateral hands. ?14.  Lumbar stenosis: S\P decompression January 2023. ?- 4/11 X-rays of lumbar spine was order because patient had a recent fall and is worried about her hardware. ?- No a

## 2022-01-04 DIAGNOSIS — K5900 Constipation, unspecified: Secondary | ICD-10-CM

## 2022-01-04 DIAGNOSIS — R7301 Impaired fasting glucose: Secondary | ICD-10-CM

## 2022-01-04 DIAGNOSIS — M25552 Pain in left hip: Secondary | ICD-10-CM

## 2022-01-04 DIAGNOSIS — R414 Neurologic neglect syndrome: Secondary | ICD-10-CM

## 2022-01-04 DIAGNOSIS — E785 Hyperlipidemia, unspecified: Secondary | ICD-10-CM

## 2022-01-04 DIAGNOSIS — R1310 Dysphagia, unspecified: Secondary | ICD-10-CM

## 2022-01-04 LAB — COMPREHENSIVE METABOLIC PANEL
ALT: 36 U/L (ref 0–44)
AST: 25 U/L (ref 15–41)
Albumin: 3.2 g/dL — ABNORMAL LOW (ref 3.5–5.0)
Alkaline Phosphatase: 65 U/L (ref 38–126)
Anion gap: 5 (ref 5–15)
BUN: 12 mg/dL (ref 8–23)
CO2: 26 mmol/L (ref 22–32)
Calcium: 9.1 mg/dL (ref 8.9–10.3)
Chloride: 107 mmol/L (ref 98–111)
Creatinine, Ser: 0.86 mg/dL (ref 0.61–1.24)
GFR, Estimated: >60 mL/min (ref >60)
Glucose, Bld: 121 mg/dL — ABNORMAL HIGH (ref 70–99)
Potassium: 3.7 mmol/L (ref 3.5–5.1)
Sodium: 138 mmol/L (ref 135–145)
Total Bilirubin: 0.6 mg/dL (ref 0.3–1.2)
Total Protein: 5.7 g/dL — ABNORMAL LOW (ref 6.5–8.1)

## 2022-01-04 LAB — CBC
HCT: 42.4 % (ref 39.0–52.0)
Hemoglobin: 14.1 g/dL (ref 13.0–17.0)
MCH: 29.5 pg (ref 26.0–34.0)
MCHC: 33.3 g/dL (ref 30.0–36.0)
MCV: 88.7 fL (ref 80.0–100.0)
Platelets: 164 10*3/uL (ref 150–400)
RBC: 4.78 MIL/uL (ref 4.22–5.81)
RDW: 13.4 % (ref 11.5–15.5)
WBC: 6.9 10*3/uL (ref 4.0–10.5)
nRBC: 0 % (ref 0.0–0.2)

## 2022-01-04 MED ORDER — FOOD THICKENER (SIMPLYTHICK)
10.0000 | ORAL | Status: DC | PRN
Start: 2022-01-04 — End: 2022-01-04

## 2022-01-04 MED ORDER — PANTOPRAZOLE SODIUM 40 MG PO TBEC: 40.0000 mg | DELAYED_RELEASE_TABLET | Freq: Every day | ORAL | Status: AC

## 2022-01-04 MED ORDER — MELATONIN 10 MG PO TABS: 10.0000 mg | ORAL_TABLET | Freq: Every day | ORAL | 0 refills | Status: AC

## 2022-01-04 MED ORDER — TIZANIDINE HCL 4 MG PO TABS
4.0000 mg | ORAL_TABLET | Freq: Three times a day (TID) | ORAL | Status: DC
  Administered 2022-01-04 – 2022-01-05 (×3): 4 mg via ORAL
  Filled 2022-01-04 (×3): qty 1

## 2022-01-04 MED ORDER — TRAMADOL HCL 50 MG PO TABS: 50.0000 mg | ORAL_TABLET | Freq: Four times a day (QID) | ORAL | 0 refills | Status: DC

## 2022-01-04 MED ORDER — METHYLPHENIDATE HCL 10 MG PO TABS: 15.0000 mg | ORAL_TABLET | Freq: Two times a day (BID) | ORAL | 0 refills | Status: DC

## 2022-01-04 MED ORDER — TIZANIDINE HCL 4 MG PO TABS: 4.0000 mg | ORAL_TABLET | Freq: Three times a day (TID) | ORAL | 0 refills | Status: DC

## 2022-01-04 MED ORDER — DANTROLENE SODIUM 50 MG PO CAPS: 50.0000 mg | ORAL_CAPSULE | Freq: Four times a day (QID) | ORAL | Status: AC

## 2022-01-04 MED ORDER — DICLOFENAC SODIUM 1 % EX GEL: 2.0000 g | Freq: Four times a day (QID) | CUTANEOUS | Status: AC

## 2022-01-04 MED ORDER — CAMPHOR-MENTHOL 0.5-0.5 % EX LOTN: TOPICAL_LOTION | CUTANEOUS | 0 refills | Status: AC | PRN

## 2022-01-04 MED ORDER — AMANTADINE HCL 100 MG PO CAPS: 100.0000 mg | ORAL_CAPSULE | Freq: Every day | ORAL | Status: DC

## 2022-01-04 MED ORDER — HYDROCORTISONE 1 % EX CREA: TOPICAL_CREAM | Freq: Two times a day (BID) | CUTANEOUS | 0 refills | Status: AC

## 2022-01-04 NOTE — Progress Notes (Signed)
Occupational Therapy Session Note ? ?Patient Details  ?Name: Patrick Rose ?MRN: [id number] ?Date of Birth: 1950-12-20 ? ?Today's Date: 01/04/2022 ?OT Individual Time: 3825-0539 ?OT Individual Time Calculation (min): 45 min  ? ? ?Short Term Goals: ?Week 4:  OT Short Term Goal 1 (Week 4): Pt will don a shirt with min A ?OT Short Term Goal 2 (Week 4): Pt will complete bed mobility, rolling R and L with no more than mod A overall ?OT Short Term Goal 3 (Week 4): Pt will sit EOB with mod A without UE support ? ?Skilled Therapeutic Interventions/Progress Updates:  ?  Pt received from PT in gym. Pt requesting to work on LUE NMR for session d/t fatigue from PT session. Attended, facilitated e-stim (parameters below) used for majority of session. E-stim was applied to his bicep/tricep, as well as a second 10 min session to his tricep and supraspinatus. Worked on overhead weightbearing with manual resistance applied through his open palm to help approximate GH joint, as well as encourage extension synergy with facilitation at the scapula as well. No active activation in gravity eliminated positions of any muscle in the LUE, but excellent response to e-stim. He completed 1x stand at the elevated table with max A of one! Excellent improvement of midline awareness. Pt returned to his room and was left sitting up in the TIS with his wife present.  ? ?1:1 NMES ?Ratio 1:3 ?Rate 35 pps ?Waveform- Asymmetric ?Ramp 1.0 ?Pulse 300 ?Intensity- 18 ?Duration - 15 min x2  ? ?Report of pain at the beginning of session 0/10 ?Report of pain at the end of session 0/10 ? ?No adverse reactions after treatment and is skin intact.  ? ? ?Therapy Documentation ?Precautions:  ?Precautions ?Precautions: Fall ?Precaution Comments: dense L hemipareisis with UE/LE tone, pushes to L, limited focus ?Restrictions ?Weight Bearing Restrictions: No ? ? ?Therapy/Group: Individual Therapy ? ?Crissie Reese ?01/04/2022, 11:08 AM ?

## 2022-01-04 NOTE — Discharge Summary (Signed)
Physician Discharge Summary  ?Patient ID: ?Patrick Rose ?MRN: [id number] ?DOB/AGE: 72-Aug-1952 72 y.o. ? ?Admit date: 12/08/2021 ?Discharge date: 01/05/2022 ? ?Discharge Diagnoses:  ?Principal Problem: ?  Acute ischemic right MCA stroke ([place]) ?Active Problems: ?  Pressure injury of skin ?  Elevated lipids ?  Dysphagia ?  Impaired fasting glucose ?  Left hip pain ?  Constipation ?  Hemi-inattention--left ? ? ?Discharged Condition: stable ? ?Significant Diagnostic Studies: ?DG Lumbar Spine Complete ? ?Result Date: 12/15/2021 ?CLINICAL DATA:  Fall approximately 1 month ago. Prior lumbar fusion. EXAM: LUMBAR SPINE - COMPLETE 4+ VIEW COMPARISON:  Lumbar spine radiographs 01/12/2019 and lumbar spine MRI 09/02/2021 FINDINGS: There are 5 non rib-bearing lumbar type vertebrae. There is chronic trace anterolisthesis of L4 on L5, and there is mild straightening of the normal lumbar lordosis. There has been interval posterior and interbody fusion at L4-5. No fracture is identified. Anterior endplate spurring elsewhere in the lumbar and lower thoracic spine is similar to the prior radiographs. Lumbar intervertebral disc space heights are preserved. Mild-to-moderate lumbar facet arthrosis is noted. IMPRESSION: 1. No acute osseous abnormality identified. 2. Interval L4-5 fusion. Electronically Signed   By: Logan Bores M.D.   On: 12/15/2021 15:17  ? ?CT HEAD WO CONTRAST (5MM) ? ?Result Date: 12/28/2021 ?CLINICAL DATA:  Stroke, follow up; new tingling along the left side of face since noon EXAM: CT HEAD WITHOUT CONTRAST TECHNIQUE: Contiguous axial images were obtained from the base of the skull through the vertex without intravenous contrast. RADIATION DOSE REDUCTION: This exam was performed according to the departmental dose-optimization program which includes automated exposure control, adjustment of the mA and/or kV according to patient size and/or use of iterative reconstruction technique. COMPARISON:  12/08/2021 FINDINGS: Brain:  Evolution of right MCA territory infarction involving the frontal lobe with some parietal extension, basal ganglia, and insula. Similar evolution of right ACA territory infarction involving the frontal lobe. Edema and mass effect have significantly improved. Mildly increased density may reflect presence of petechial hemorrhage, but there is no discrete hematoma. There is no new loss of gray-white differentiation separate from above. Probable wallerian degeneration along the right corticospinal tract. Vascular: No new finding. Skull: Calvarium is unremarkable. Sinuses/Orbits: No acute finding. Other: None. IMPRESSION: Expected evolution of right MCA and ACA territory infarctions. There may be some petechial hemorrhage, but there is no discrete hematoma. Mass effect is significantly improved. Electronically Signed   By: Macy Mis M.D.   On: 12/28/2021 16:29  ? ?CT HEAD WO CONTRAST (5MM) ? ?Result Date: 12/08/2021 ?CLINICAL DATA:  Acute right MCA stroke, follow-up. EXAM: CT HEAD WITHOUT CONTRAST TECHNIQUE: Contiguous axial images were obtained from the base of the skull through the vertex without intravenous contrast. RADIATION DOSE REDUCTION: This exam was performed according to the departmental dose-optimization program which includes automated exposure control, adjustment of the mA and/or kV according to patient size and/or use of iterative reconstruction technique. COMPARISON:  Two days ago FINDINGS: Brain: Extensive cytotoxic edema in the right MCA and distal ACA territory. Local mass effect with 4 mm midline shift. No herniation. No hemorrhage or hydrocephalus. Vascular: No interval abnormality. Skull: Normal. Negative for fracture or focal lesion. Sinuses/Orbits: No acute finding. IMPRESSION: 1. No detected progression of right MCA and distal ACA territory infarcts. No hemorrhagic conversion. 2. Cytotoxic edema causes 4 mm of midline shift. Electronically Signed   By: Jorje Guild M.D.   On: 12/08/2021  07:14  ? ?CT HEAD WO CONTRAST (5MM) ? ?Result Date: 12/06/2021 ?  CLINICAL DATA:  Stroke follow-up. EXAM: CT HEAD WITHOUT CONTRAST TECHNIQUE: Contiguous axial images were obtained from the base of the skull through the vertex without intravenous contrast. RADIATION DOSE REDUCTION: This exam was performed according to the departmental dose-optimization program which includes automated exposure control, adjustment of the mA and/or kV according to patient size and/or use of iterative reconstruction technique. COMPARISON:  MR head 12/04/2021; CT head 12/03/2021 FINDINGS: Brain: Extensive right MCA territory infarct and moderate right ACA territory infarct, stable compared to 12/04/2021 MR head. Unchanged mass effect with near complete effacement of the frontal horn of the right lateral ventricle. There is approximately 3 mm of leftward midline shift, similar to most recent prior exam. No interval intracranial hemorrhage, herniation, extra-axial fluid collection or hydrocephalus. No definite correlate for the T2 FLAIR hyperintense signal abnormality within the superior right cerebellar hemisphere seen on MR head 12/04/2021. Vascular: No hyperdense vessel or unexpected calcification. Skull: Normal. Negative for fracture or focal lesion. Sinuses/Orbits: No acute finding. Other: None. IMPRESSION: Extensive right MCA territory infarct and moderate right ACA territory infarct are stable compared to 12/04/2021. Unchanged mild leftward midline shift. No interval intracranial hemorrhage, herniation, or hydrocephalus. Electronically Signed   By: Ileana Roup M.D.   On: 12/06/2021 17:27  ? ?DG Swallowing Func-Speech Pathology ? ?Result Date: 12/22/2021 ?Table formatting from the original result was not included. Objective Swallowing Evaluation: Type of Study: MBS-Modified Barium Swallow Study  Patient Details Name: Patrick Rose MRN: [id number] Date of Birth: 05/09/1951 Today's Date: 12/22/2021 Time: SLP Start Time (ACUTE ONLY): 0909  -SLP Stop Time (ACUTE ONLY): 0942 SLP Time Calculation (min) (ACUTE ONLY): 33 min Past Medical History: Past Medical History: Diagnosis Date  Afib ([place])   Cervical myelopathy ([facility])   Lumbar stenosis 09/2021  with pain radiatiing down buttocks to thighs Past Surgical History: Past Surgical History: Procedure Laterality Date  ANTERIOR CERVICAL DECOMP/DISCECTOMY FUSION  2008  APPENDECTOMY    BACK SURGERY    IR CT HEAD LTD  12/03/2021  IR PERCUTANEOUS ART THROMBECTOMY/INFUSION INTRACRANIAL INC DIAG ANGIO  12/03/2021  RADIOLOGY WITH ANESTHESIA N/A 12/03/2021  Procedure: IR WITH ANESTHESIA;  Surgeon: Luanne Bras, MD;  Location: [facility] [facility];  Service: Radiology;  Laterality: N/A; HPI: 71 year old man who [facility] ED 3/30 as a Code Stroke due to L hemiplegia and R gaze deviation. LKW 1700 3/30. PMHx significant Afib (CHA2DS2-VASc 1, not on AC), aortic aneurysm (stable) admitted with acute stroke s/p TNK and mechanical thrombectomy of right MCA m1 with TICI 3.  Subjective: alert, participatory  Recommendations for follow up therapy are one component of a multi-disciplinary discharge planning process, led by the attending physician.  Recommendations may be updated based on patient status, additional functional criteria and insurance authorization. Assessment / Plan / Recommendation   12/22/2021   9:58 AM Clinical Impressions Clinical Impression Pt seen this date for instrumental swallow study (MBSS) secondary to s/sx c/f pharyngeal dysphagia at bedside, and to assess readiness for upgrading liquid consistencies. Per instrumental findings, pt presents with mild-moderate oral and mild pharyngeal dysphagia in the setting of acute R CVA resulting in moderate-severe cognitive impairment and dysarthria. Please note, performance likely compounded by cognitive deficits to include poor focused attention and working memory. Oral phase c/b generalized lingual weakness resulting in lingual pumping at times, bolus holding, oral  residuals along L lateral sulci, reduced posterior propulsion of bolus, premature spillage of bolus, and decreased bolus cohesion. Pharyngeal phase c/b reduced base of tongue approximation to posterior pharyngeal

## 2022-01-04 NOTE — Progress Notes (Signed)
Speech Language Pathology Weekly Progress and Session Note ? ?Patient Details  ?Name: Patrick Rose ?MRN: [id number] ?Date of Birth: [birth year] ? ?Beginning of progress report period: December 28, 2021 ?End of progress report period: Jan 04, 2022 ? ?Today's Date: 01/04/2022 ?SLP Individual Time: [phone] ?SLP Individual Time Calculation (min): 60 min ? ?Short Term Goals: ?Week 4: SLP Short Term Goal 1 (Week 4): Pt will consume recommended diet textures with s/sx concerning for aspiration <25% of the time and Mod A to implement safe swallow strategies. ?SLP Short Term Goal 1 - Progress (Week 4): Met ?SLP Short Term Goal 2 (Week 4): Pt will complete functional basic problem-solving tasks with 80% accuracy given Min A. ?SLP Short Term Goal 2 - Progress (Week 4): Met ?SLP Short Term Goal 3 (Week 4): Pt will attend to L visual field to complete table top tasks with 80% accuracy given visual anchor and Min vebral cues. ?SLP Short Term Goal 3 - Progress (Week 4): Met ?SLP Short Term Goal 4 (Week 4): Patient will demonstrate sustained attention to functional tasks for 5 minutes with Mod verbal cues for redirection. ?SLP Short Term Goal 4 - Progress (Week 4): Met ?SLP Short Term Goal 5 (Week 4): Patient will self-monitor and correct errors during functional tasks with Mod verbal cues. ?SLP Short Term Goal 5 - Progress (Week 4): Met ? ?  ?New Short Term Goals: ?Week 5: SLP Short Term Goal 1 (Week 5): Pt will consume recommended diet textures with s/sx concerning for aspiration <25% of the time and Min A to implement safe swallow strategies. ?SLP Short Term Goal 2 (Week 5): Pt will complete functional basic problem-solving tasks with 80% accuracy given supervision level cues. ?SLP Short Term Goal 3 (Week 5): Pt will attend to L visual field to during structured tasks with 80% accuracy given visual anchor and supervision level verbal cues. ?SLP Short Term Goal 4 (Week 5): Patient will demonstrate sustained attention to functional  tasks for 10 minutes with Min verbal cues for redirection. ?SLP Short Term Goal 5 (Week 5): Patient will self-monitor and correct errors during functional tasks with Min verbal cues. ? ?Weekly Progress Updates: Patient has made functional gains and has met 5 of 5 STGs this reporting period. Currently, patient is consuming Dys. 2 textures with thin liquids with minimal overt s/s of aspiration with Mod verbal cues for use of swallowing compensatory strategies. Patient demonstrates an improvement in overall cognitive functioning and requires Min-Mod A verbal cues to complete functional and familiar tasks safely in regards to sustained attention, functional problem solving, emergent awareness and left visual scanning. Patient and family education ongoing.Patient would benefit from continued skilled SLP intervention to maximize his swallowing and cognitive functioning prior to discharge.  ?  ? ? ?Intensity: Minumum of 1-2 x/day, 30 to 90 minutes ?Frequency: 3 to 5 out of 7 days ?Duration/Length of Stay: TBD due to SNF placement ?Treatment/Interventions: Cognitive remediation/compensation;Cueing hierarchy;Dysphagia/aspiration precaution training;Functional tasks;Medication managment;Patient/family education;Internal/external aids;Speech/Language facilitation ? ? ?Daily Session ? ?Skilled Therapeutic Interventions:  Skilled treatment session focused on dysphagia and cognitive goals. SLP facilitated session by providing skilled observation with trials of Dys. 3 textures. Patient demonstrated efficient mastication but continues to demonstrate moderate oral residue that takes Min-Mod verbal cues and liquid washes to clear. Recommend ongoing trials with SLP. SLP also facilitated session by providing extra time and overall Min question cues for complex problem solving during a scheduling task. Min verbal cues were also needed for sustained attention to task for ~  20 minutes. SLP also re-administered the [place] Visual  Cancellation Test-Single Stimuli. Patient missed only 14 occurences of the stimuli today compared to 54 occurences on 4/22 indicative of improved use of compensatory strategies for visual scanning/attention. Patient left upright in bed with alarm on and all needs within reach. Continue with current plan of care.    ? ?Pain ?No/Denies Pain  ? ?Therapy/Group: Individual Therapy ? ?Akua Blethen ?01/04/2022, 6:28 AM ? ? ? ? ? ? ?

## 2022-01-04 NOTE — Progress Notes (Signed)
Physical Therapy Session Note ? ?Patient Details  ?Name: Patrick Rose ?MRN: [id number] ?Date of Birth: Jan 06, 1951 ? ?Today's Date: 01/04/2022 ?PT Individual Time: 1420-1530 ?PT Individual Time Calculation (min): 70 min  ? ?Short Term Goals: ?Week 3:  PT Short Term Goal 1 (Week 3): Patient will perform sitting balance with supervision >2 min with mod cues for midline orientation. ?PT Short Term Goal 2 (Week 3): Patient will perform chair<>chair transfers with mod A +2. ?PT Short Term Goal 3 (Week 3): Pt will perform sit<>stand with Max A using LRAD. ?PT Short Term Goal 4 (Week 3): Patient will perform bed mobility with mod A >50% of the time with use of hospital bed features. ? ?Skilled Therapeutic Interventions/Progress Updates:  ?   ?Pt received supine in bed and agrees to therapy. Reports pain in L hip. Number not provided. PT provides rest breaks and mobility to manage pain. OT and trainee present for +2/3 assistance as needed. Pt performs supine bridging to don pants. Supine to sit with modA +2 and cues for hand placement, log rolling, and sequencing. Pt sits at EOB with CGA/minA and cues for posture and positioning. Stand pivot transfer to [facility] with modA +2 and cues for initiation, sequencing, and positioning. WC transport to gym. Pt completes sit to stand to hall rails with maxA +2 and cues for body mechanics. PT provides mod blocking of L knee and manual facilitation of trunk extension and neutral posture in standing. Following seated rest break, pt ambulates x2 bouts of x30' with modA +2 and R hand rails. +3 for WC follow for safety. PT uses "leg lifters" to help progress L lower extremity and provides tactile cueing through distal thigh for stability and quad contraction. Multimodal cueing for neutral posture and trunk positioning. Seated rest break between bouts. ? ?Pt performs stand pivot transfer to mat table to the L with maxA +1, minA +2. Pt performs block practicing of sit to stand transfer from mat with  mirror provided for visual feedback. Pt initially performs from significantly elevated mat and height is gradually decreased with subsequent reps. Pt initially requires mod to maxA +2 for sit to stand but is able to progress to modA +1. In standing pt cued to attempt neutral posture and performs anterior/posterior/lateral weight shifting. Pt completes x8 reps total and stands up to 1 minutes each rep. ? ?Pt positioned in semireclined position on wedge to promote passive stretch of hip flexors as well as assisted stretch of anterior chest musculature. Towel roll placed between shoulder blades to promote increased chest expansion and scapular retraction. ? ?Pt performs stand pivot back to WC with modA +2. Handed of to OT in gym. ? ?Therapy Documentation ?Precautions:  ?Precautions ?Precautions: Fall ?Precaution Comments: dense L hemipareisis with UE/LE tone, pushes to L, limited focus ?Restrictions ?Weight Bearing Restrictions: No ? ? ? ?Therapy/Group: Individual Therapy ? ?Beau Fanny, PT, DPT ?01/04/2022, 4:42 PM  ?

## 2022-01-04 NOTE — Progress Notes (Addendum)
Patient ID: Patrick Rose, male   DOB: 14-Nov-1950, 71 y.o.   MRN: [id number] ? ?SW received message (4/29) from [facility] reporting that insurance denied placement and requires a P2P to be completed to by 12pm today.  ? ?SW spoke with Shanda Bumps to get P2P contact number: [id number] #[phone]. SW updated physician and waiting on updates on status.  ?*SW received updates from physician that P2P was approved by MD and they continue to have computer issues. SW spoke with Shanda Bumps with [facility] to inform on approval. Reports she is going to see if he is approved through the portal as they must have an authorization number before he be admitted. SW waiting on follow-up.  ? ?[id number] spoke with Shanda Bumps who reported authorization was confirmed and good for three days. SW will confirm his discharge tomorrow.  ? ? ?Cecile Sheerer, MSW, LCSWA ?Office: [phone] ?Cell: [phone] ?Fax: [phone]  ? ?

## 2022-01-04 NOTE — Progress Notes (Signed)
?                                                       PROGRESS NOTE ? ? ?Subjective/Complaints: ? ?Slept better last night. Feels that spasticity might be improved as well. Tolerating tizanidine ? ?ROS: Patient denies fever, rash, sore throat, blurred vision, dizziness, nausea, vomiting, diarrhea, cough, shortness of breath or chest pain, joint or back/neck pain, headache, or mood change.  ? ? ? ?Objective: ?  ?No results found. ?Recent Labs  ?  01/04/22 ?3785  ?WBC 6.9  ?HGB 14.1  ?HCT 42.4  ?PLT 164  ? ? ? ?Recent Labs  ?  01/04/22 ?8850  ?NA 138  ?K 3.7  ?CL 107  ?CO2 26  ?GLUCOSE 121*  ?BUN 12  ?CREATININE 0.86  ?CALCIUM 9.1  ? ? ? ? ?Intake/Output Summary (Last 24 hours) at 01/04/2022 1011 ?Last data filed at 01/04/2022 0808 ?Gross per 24 hour  ?Intake 900 ml  ?Output 3000 ml  ?Net -2100 ml  ?  ? ?Pressure Injury 12/25/21 Buttocks Left;Lower Stage 1 -  Intact skin with non-blanchable redness of a localized area usually over a bony prominence. red (Active)  ?12/25/21 0508  ?Location: Buttocks  ?Location Orientation: Left;Lower  ?Staging: Stage 1 -  Intact skin with non-blanchable redness of a localized area usually over a bony prominence.  ?Wound Description (Comments): red  ?Present on Admission: No  ?   ?Pressure Injury 12/30/21 Coccyx Lower Stage 2 -  Partial thickness loss of dermis presenting as a shallow open injury with a red, pink wound bed without slough. (Active)  ?12/30/21 0958  ?Location: Coccyx  ?Location Orientation: Lower  ?Staging: Stage 2 -  Partial thickness loss of dermis presenting as a shallow open injury with a red, pink wound bed without slough.  ?Wound Description (Comments):   ?Present on Admission:   ? ? ?Physical Exam ? ? ?Vital Signs ?Blood pressure 128/70, pulse 62, temperature 97.7 ?F (36.5 ?C), temperature source Oral, resp. rate 16, height 6\' 1"  (1.854 m), weight 86.6 kg, SpO2 95 %. ? ?Constitutional: No distress . Vital signs reviewed. ?HEENT: NCAT, EOMI, oral membranes  moist ?Neck: supple ?Cardiovascular: RRR without murmur. No JVD    ?Respiratory/Chest: CTA Bilaterally without wheezes or rales. Normal effort    ?GI/Abdomen: BS +, non-tender, non-distended ?Ext: no clubbing, cyanosis, or edema ?Psych: pleasant and cooperative  ?Skin:  Has multiple bruises on right thigh upper extremities, right hip groin is purple some bruising. Redness buttocks, coccyx ?Neurological: alert and oriented. Fair insight and awareness. Normal language. Can follow simple commands left central 7 and left hemianopsia. Left cheek/perioral area with sl decrease in sensation of light touch.  Dysarthric. Motor: LLU/LLE 0/5    LLE tone 1+ to 2/4. LUE tone 2/4   DTR's 3+ (left side).     ?Musculoskeletal: continued  left hip pain with PROM, no pain with palpation. ? ? ?Assessment/Plan: ?1. Functional deficits which require 3+ hours per day of interdisciplinary therapy in a comprehensive inpatient rehab setting. ?Physiatrist is providing close team supervision and 24 hour management of active medical problems listed below. ?Physiatrist and rehab team continue to assess barriers to discharge/monitor patient progress toward functional and medical goals ? ?Care Tool: ? ?Bathing ? Bathing activity did not occur: Safety/medical concerns ?Body parts bathed by  patient: Chest, Abdomen, Front perineal area, Right upper leg, Left upper leg, Face, Left arm  ? Body parts bathed by helper: Right arm, Buttocks, Right lower leg, Left lower leg ?  ?  ?Bathing assist Assist Level: 2 Helpers ?  ?  ?Upper Body Dressing/Undressing ?Upper body dressing Upper body dressing/undressing activity did not occur (including orthotics): Safety/medical concerns ?What is the patient wearing?: Pull over shirt ?   ?Upper body assist Assist Level: Moderate Assistance - Patient 50 - 74% ?   ?Lower Body Dressing/Undressing ?Lower body dressing ? ? ?   ?What is the patient wearing?: Pants ? ?  ? ?Lower body assist Assist for lower body dressing: 2  Helpers ?   ? ?Toileting ?Toileting    ?Toileting assist Assist for toileting: 2 Helpers ?  ?  ?Transfers ?Chair/bed transfer ? ?Transfers assist ? Chair/bed transfer activity did not occur: Safety/medical concerns ? ?Chair/bed transfer assist level: 2 Helpers (squat pivot) ?  ?  ?Locomotion ?Ambulation ? ? ?Ambulation assist ? ? Ambulation activity did not occur: Safety/medical concerns ? ?Assist level: 2 helpers ?Assistive device: Other (comment) (3 Musketeer) ?Max distance: 48ft  ? ?Walk 10 feet activity ? ? ?Assist ? Walk 10 feet activity did not occur: Safety/medical concerns ? ?  ?   ? ?Walk 50 feet activity ? ? ?Assist Walk 50 feet with 2 turns activity did not occur: Safety/medical concerns ? ?  ?   ? ? ?Walk 150 feet activity ? ? ?Assist Walk 150 feet activity did not occur: Safety/medical concerns ? ?  ?  ?  ? ?Walk 10 feet on uneven surface  ?activity ? ? ?Assist Walk 10 feet on uneven surfaces activity did not occur: Safety/medical concerns ? ? ?  ?   ? ?Wheelchair ? ? ? ? ?Assist Is the patient using a wheelchair?: Yes ?Type of Wheelchair: Manual ?Wheelchair activity did not occur: Safety/medical concerns ? ?  ?   ? ? ?Wheelchair 50 feet with 2 turns activity ? ? ? ?Assist ? ?  ?Wheelchair 50 feet with 2 turns activity did not occur: Safety/medical concerns ? ? ?   ? ?Wheelchair 150 feet activity  ? ? ? ?Assist ? Wheelchair 150 feet activity did not occur: Safety/medical concerns ? ? ?   ? ?Blood pressure 128/70, pulse 62, temperature 97.7 ?F (36.5 ?C), temperature source Oral, resp. rate 16, height 6\' 1"  (1.854 m), weight 86.6 kg, SpO2 95 %. ? ?Medical Problem List and Plan: ?1. Functional deficits secondary to right MCA infarct likely embolic d/t atrial fib. ?-Patient may shower.Minimal return of function at ~36mo post CVA  ?   -Continue CIR therapies including PT, OT, and SLP, SNF pending. Pt needs peer to peer with insurance co today for SNF covg ? -recent CT with expected evolution of stroke ?2.   Antithrombotics: ?-DVT/anticoagulation:  Eliquis started on 12/08/21.  Dopplers show no blockage. ?            -antiplatelet therapy:N/A ?-4/15 Continue Eliquis 5 mg daily. ?3. Pain Management for left hip pain: Continue encouraging use of foods from list that aid in pain management. ?-XR without evidence of fracture. ?-Continue focus on spasticity control and ROM. ?-Continue K pad use ?-Continue Tylenol prn and Voltaren gel as needed. ?-ROM with PT helpful   ?4. Mood: LCSW did follow for evaluation and support.   ?-Antipsychotic agents: N/A ?5. Neuropsych: Patient may be intermittently capable of making decisions on his own behalf. ? -increased ritalin to  15mg  bid for attention, continue amantadine for now also ?6. Skin/Wound Care: Routine skin checks ?Hydrocortisone for rash on back.  Maculopapular rash is improving. ?7. Fluids/Electrolytes/Nutrition: encourage PO ?-I personally reviewed the patient's labs today.   ?8. A-fib: Monitor heart rate 3 times daily heart rate continues to remain in the 90s. ?4/28- HR 60s- co't regimen ? ?Vitals:  ? 01/04/22 0348 01/04/22 0700  ?BP: 120/76 128/70  ?Pulse: 74 62  ?Resp: 17 16  ?Temp: 98.1 ?F (36.7 ?C) 97.7 ?F (36.5 ?C)  ?SpO2: 96% 95%  ? ? ?9. Dysphagia: diet upgraded to D2/thins ?10. Hyperlipidemia for HDL continue Crestor 20 mg. ?11.  Constipation: Continue senna twice daily. ?-4/7 multiple BMs after sorbitol. ?-4/11.  Stop Senna since patient's stools are pasty and frequent and he is incontinent -4/15 Pt reports normal BM today. ?4/17- LBM this AM- mushy- incontinent- con't regimen and monitor- due to stroke, likely why incontinent ?4/18- LBM yesterday ?5/1 receiving evening suppository@ 2000 ? -having PM bm's, still some incontinence ?12.acute blood loss anemia: Hemoglobin down from 14.4-12.2. ?- Monitor for signs of bleeding. ?- 4/5 Hgb up to 13.8 ?--4/10 Hemoglobin 13.4 ?13.  History of cervical DDD with cord compression: S\P ACDF with residual numbness in bilateral  hands. ?14.  Lumbar stenosis: S\P decompression January 2023. ?- 4/11 X-rays of lumbar spine was order because patient had a recent fall and is worried about her hardware. ?- No acute osseous abnormality identified

## 2022-01-05 DIAGNOSIS — M25552 Pain in left hip: Secondary | ICD-10-CM

## 2022-01-05 DIAGNOSIS — G479 Sleep disorder, unspecified: Secondary | ICD-10-CM

## 2022-01-05 DIAGNOSIS — R1312 Dysphagia, oropharyngeal phase: Secondary | ICD-10-CM

## 2022-01-05 MED ORDER — METHYLPHENIDATE HCL 10 MG PO TABS: 15.0000 mg | ORAL_TABLET | Freq: Two times a day (BID) | ORAL | 0 refills | Status: DC

## 2022-01-05 MED ORDER — TRAMADOL HCL 50 MG PO TABS: 50.0000 mg | ORAL_TABLET | Freq: Four times a day (QID) | ORAL | 0 refills | Status: DC

## 2022-01-05 NOTE — Progress Notes (Signed)
Physical Therapy Discharge Summary ? ?Patient Details  ?Name: Patrick Rose ?MRN: [id number] ?Date of Birth: 1951/08/19 ? ?Today's Date: 01/05/2022 ?PT Individual Time: 0845-1000 ?PT Individual Time Calculation (min): 75 min  ? ? ?Patient has met 5 of 7 long term goals due to improved activity tolerance, improved balance, improved postural control, increased strength, increased range of motion, decreased pain, ability to compensate for deficits, improved attention, improved awareness, and improved coordination.  Patient to discharge at a wheelchair level Mod-Max Assist +1-2.   [name] care partner requires assistance to provide the necessary physical and cognitive assistance at discharge. ? ?Reasons goals not met: Patient continues to require mod-max A for bed mobility due to continued L flexor tone and dense hemiplegia. Patient to continue with skilled therapies at SNF and current level of function is adequate for d/c at this time.  ? ?Recommendation:  ?Patient will benefit from ongoing skilled PT services in skilled nursing facility setting to continue to advance safe functional mobility, address ongoing impairments in L side tone/spasticity, strengthening, activity tolerance, ROM, functional mobility, therapeutic gait training, sitting balance, standing balance, patient/caregiver education, and minimize fall risk. ? ?Equipment: ?No equipment provided, equipment needs to be addressed at next level of care ? ?Reasons for discharge: treatment goals met and discharge from hospital ? ?Patient/family agrees with progress made and goals achieved: Yes ? ?Skilled Therapeutic Intervention: ?Patient in bed with his wife at bedside upon PT arrival. Patient alert and agreeable to PT session with co-treat with Carly, stroke team captain for skilled +2 and consult for a complex patient. Patient reported intermittent L hip pain with onset of spasticity with mobility during session, RN made aware. PT provided repositioning, rest  breaks, and distraction as pain interventions throughout session.  ? ?Therapeutic Activity: ?Bed Mobility: Patient performed rolling L with CGA-min A for facilitation of trunk rotation in a flat bed without use of bed rail, he performed rolling L with mod A with use of bed rail in a flat bed. Provided cues for sequencing, and head/trunk rotation to the R to assist with rolling. He performed supine to/from sit with mod-max A. Provided verbal cues for progressing through side-lying, use of bottom elbow for trunk control to push up to sitting, and bringing knees to chest to lift lower extremities onto the bed. ?Dressing/sitting balance: Patient donned pants in supine performing bridging with facilitation through knee and foot on L with +2 to pull up shorts with total A. Performed sitting balance, progressing from mod A to supervision <1 min, maintained sitting balance with supervision-min A >3 min while doffing/donning t-shirt, and donning B shoes and L LE Matrix Max GRAFO with total assist. Provided cues for midline orientation, R hand placement to reduce pushing, abdominal activation to reduce retropulsion, especially with donning shoes, and sequencing for doffing/donning shoes.  ?Transfers: Patient performed stand pivot bed<>w/c with max A of 1 person and +2 SBA for safety with PT blocking L foot to prevent hip adduction and L knee to prevent buckling, facilitated hip extension and L foot placement during transfer. Provided max cues for initiation with trunk flexion and forward weight shift, gluteal activation for boosting up to standing, and reaching back to sit to control descent. He performed sit to/from stand x2 with mod a +3 in 3 musketeer position for gait training with facilitation above his knee for knee extension and behind his hips for hip extension. Provided verbal cues as for stand pivots, also with focus on trunk extension and midline orientation to  correct L lean. ?He performed sit to/from stand x2  with mod A +2 using R HR with max cues and facilitation to prevent pushing with his R hand.  ? ?Gait Training:  ?Patient ambulated 120 feet and 114 feet with max assist +2 using 3 Musketeer support and 3rd person providing visual target and max cues for trunk extension and looking ahead. L LE Matrix Max GRAFO donned throughout for increased L foot clearance and reduced L knee buckling.  ?- pt able to achieve increased L hip extension during stance to allow longer R LE step length for a reciprocal stepping pattern ?- continues to require max/total manual facilitation for L hip/knee extension during stance phase as well as total assist to advance L LE during swing ?- continues to have L trunk lean although significantly improved since last time seen by this therapist ?Provided facilitation for weight shifts, trunk control, and L lower extremity control with max cues for sequencing, attention to task, and hip/trunk extension in midline.  ? ?Patient performed step-tap with his R foot x5 with mod-max A +2 in 3 musketeer support as pre-stair training and focused on L gluteal and quad activation and weight shift in stance.  ?Patient ascended/descended 1x6" step using R hand rail with max A +2 and 3rd person SBA for safety. Performed step-to gait pattern leading with R while ascending and L while descending backwards off the step. Provided facilitation for L weight shift, R hand placement on rail with elbow on rail to prevent pushing, and L lower extremity extension in stance and stepping to/from the step. Provided cues for technique and sequencing.  ? ?Wheelchair Mobility:  ?Patient propelled wheelchair >100 feet with supervision-min A using R upper and lower extremity. Provided verbal cues for propulsion and steering technique and visual target for steering with lower extremity. ? ?Patient in bed with his wife at bedside at end of session with breaks locked, bed alarm set, and all needs within reach.  ? ?Patient tearful  during and after session due to d/c to SNF today. Patient very appreciative of therapy staff and grateful for the progress he has made while at CIR. ? ?PT Discharge ?Precautions/Restrictions ?Precautions ?Precautions: Fall ?Precaution Comments: L hemi with controversive pushing to the L, L UE/LE flexor tone, decreased attention ?Restrictions ?Weight Bearing Restrictions: No ?Pain Interference ?Pain Interference ?Pain Effect on Sleep: 1. Rarely or not at all ?Pain Interference with Therapy Activities: 2. Occasionally ?Pain Interference with Day-to-Day Activities: 1. Rarely or not at all ?Vision/Perception  ?Vision - History ?Ability to See in Adequate Light: 0 Adequate ?Vision - Assessment ?Eye Alignment: Within Functional Limits ?Alignment/Gaze Preference: Chin down;Head turned (head turn L) ?Tracking/Visual Pursuits: Requires cues, head turns, or add eye shifts to track;Decreased smoothness of horizontal tracking;Decreased smoothness of vertical tracking (need cues to track to the L limited attention) ?Saccades: Additional head turns occurred during testing;Decreased speed of saccadic movement ?Convergence: Within functional limits ?Perception ?Perception: Impaired ?Inattention/Neglect: Does not attend to left side of body ?Comments: continues to require cues to look to the L during mobility, relaxed in L rotation at rest ?Praxis ?Praxis: Impaired ?Praxis Impairment Details: Initiation;Motor planning;Perseveration  ?Cognition ?Overall Cognitive Status: Impaired/Different from baseline ?Arousal/Alertness: Awake/alert ?Orientation Level: Oriented X4 ?Attention: Sustained ?Sustained Attention: Impaired ?Sustained Attention Impairment: Verbal basic;Functional basic ?Selective Attention: Impaired ?Selective Attention Impairment: Verbal basic;Functional basic ?Memory: Appears intact ?Awareness: Impaired ?Awareness Impairment: Emergent impairment ?Problem Solving: Impaired ?Problem Solving Impairment: Verbal  basic;Functional basic ?Behaviors: Impulsive ?Safety/Judgment: Impaired ?Sensation ?Sensation ?Light  Touch: Impaired Detail ?Peripheral sensation comments: numbness with tingling of L lower extremtiy wihtout light touc

## 2022-01-05 NOTE — Progress Notes (Signed)
INPATIENT REHABILITATION DISCHARGE NOTE ? ? ?Discharge instructions by: Sent with [facility] ? ?Verbalized understanding:  ? ?Skin care/Wound care healing? Stage I and Stage II on buttocks. Dressing changed prior to DC. ? ?Pain: none ? ?IV's: None ? ?Tubes/Drains: Condom Catheter in place ? ?O2: none ? ?Safety instructions:  ? ?Patient belongings: Rhina Brackett with family ? ?Discharged to: [facility] ? ?Discharged via: [facility] ? ?Notes: done ? ?Marylu Lund, RN ? ? ? ? ? ? ? ?  ?

## 2022-01-05 NOTE — Progress Notes (Signed)
?                                                       PROGRESS NOTE ? ? ?Subjective/Complaints: ? ?No new issues overnight. Left hip still can be uncomfortable. Just learned he has SNF bed available today ? ?ROS: Patient denies fever, rash, sore throat, blurred vision, dizziness, nausea, vomiting, diarrhea, cough, shortness of breath or chest pain,  headache, or mood change.  ? ? ? ?Objective: ?  ?No results found. ?Recent Labs  ?  01/04/22 ?[id number]  ?WBC 6.9  ?HGB 14.1  ?HCT 42.4  ?PLT 164  ? ? ? ?Recent Labs  ?  01/04/22 ?[id number]  ?NA 138  ?K 3.7  ?CL 107  ?CO2 26  ?GLUCOSE 121*  ?BUN 12  ?CREATININE 0.86  ?CALCIUM 9.1  ? ? ? ? ?Intake/Output Summary (Last 24 hours) at 01/05/2022 0947 ?Last data filed at 01/05/2022 0700 ?Gross per 24 hour  ?Intake 120 ml  ?Output 1800 ml  ?Net -1680 ml  ?  ? ?Pressure Injury 12/25/21 Buttocks Left;Lower Stage 1 -  Intact skin with non-blanchable redness of a localized area usually over a bony prominence. red (Active)  ?12/25/21 0508  ?Location: Buttocks  ?Location Orientation: Left;Lower  ?Staging: Stage 1 -  Intact skin with non-blanchable redness of a localized area usually over a bony prominence.  ?Wound Description (Comments): red  ?Present on Admission: No  ?   ?Pressure Injury 12/30/21 Coccyx Lower Stage 2 -  Partial thickness loss of dermis presenting as a shallow open injury with a red, pink wound bed without slough. (Active)  ?12/30/21 0958  ?Location: Coccyx  ?Location Orientation: Lower  ?Staging: Stage 2 -  Partial thickness loss of dermis presenting as a shallow open injury with a red, pink wound bed without slough.  ?Wound Description (Comments):   ?Present on Admission:   ? ? ?Physical Exam ? ? ?Vital Signs ?Blood pressure 107/79, pulse 85, temperature 98.4 ?F (36.9 ?C), resp. rate 14, height 6\' 1"  (1.854 m), weight 86.6 kg, SpO2 96 %. ? ?Constitutional: No distress . Vital signs reviewed. ?HEENT: NCAT, EOMI, oral membranes moist ?Neck: supple ?Cardiovascular: RRR without  murmur. No JVD    ?Respiratory/Chest: CTA Bilaterally without wheezes or rales. Normal effort    ?GI/Abdomen: BS +, non-tender, non-distended ?Ext: no clubbing, cyanosis, or edema ?Psych: pleasant and cooperative  ?Skin:  bruises resolving. Redness buttocks, coccyx ?Neurological: alert and oriented. Fair insight and awareness. Normal language. Can follow simple commands left central 7 and left hemianopsia. Left cheek/perioral area with sl decrease in sensation of light touch.  Dysarthric. Motor: LLU/LLE 0/5    LLE tone 1+ to 2/4. LUE tone 1/4   DTR's 3+ (left side).     ?Musculoskeletal: mild to moderate  left hip pain with PROM, no pain with palpation. ? ? ?Assessment/Plan: ?1. Functional deficits which require 3+ hours per day of interdisciplinary therapy in a comprehensive inpatient rehab setting. ?Physiatrist is providing close team supervision and 24 hour management of active medical problems listed below. ?Physiatrist and rehab team continue to assess barriers to discharge/monitor patient progress toward functional and medical goals ? ?Care Tool: ? ?Bathing ? Bathing activity did not occur: Safety/medical concerns ?Body parts bathed by patient: Chest, Abdomen, Front perineal area, Right upper leg, Left upper leg, Face,  Left arm  ? Body parts bathed by helper: Right arm, Buttocks, Right lower leg, Left lower leg ?  ?  ?Bathing assist Assist Level: 2 Helpers ?  ?  ?Upper Body Dressing/Undressing ?Upper body dressing Upper body dressing/undressing activity did not occur (including orthotics): Safety/medical concerns ?What is the patient wearing?: Pull over shirt ?   ?Upper body assist Assist Level: Moderate Assistance - Patient 50 - 74% ?   ?Lower Body Dressing/Undressing ?Lower body dressing ? ? ?   ?What is the patient wearing?: Pants ? ?  ? ?Lower body assist Assist for lower body dressing: 2 Helpers ?   ? ?Toileting ?Toileting    ?Toileting assist Assist for toileting: 2 Helpers ?  ?  ?Transfers ?Chair/bed  transfer ? ?Transfers assist ? Chair/bed transfer activity did not occur: Safety/medical concerns ? ?Chair/bed transfer assist level: 2 Helpers (squat pivot) ?  ?  ?Locomotion ?Ambulation ? ? ?Ambulation assist ? ? Ambulation activity did not occur: Safety/medical concerns ? ?Assist level: 2 helpers ?Assistive device: Other (comment) (3 Musketeer) ?Max distance: 18ft  ? ?Walk 10 feet activity ? ? ?Assist ? Walk 10 feet activity did not occur: Safety/medical concerns ? ?  ?   ? ?Walk 50 feet activity ? ? ?Assist Walk 50 feet with 2 turns activity did not occur: Safety/medical concerns ? ?  ?   ? ? ?Walk 150 feet activity ? ? ?Assist Walk 150 feet activity did not occur: Safety/medical concerns ? ?  ?  ?  ? ?Walk 10 feet on uneven surface  ?activity ? ? ?Assist Walk 10 feet on uneven surfaces activity did not occur: Safety/medical concerns ? ? ?  ?   ? ?Wheelchair ? ? ? ? ?Assist Is the patient using a wheelchair?: Yes ?Type of Wheelchair: Manual ?Wheelchair activity did not occur: Safety/medical concerns ? ?  ?   ? ? ?Wheelchair 50 feet with 2 turns activity ? ? ? ?Assist ? ?  ?Wheelchair 50 feet with 2 turns activity did not occur: Safety/medical concerns ? ? ?   ? ?Wheelchair 150 feet activity  ? ? ? ?Assist ? Wheelchair 150 feet activity did not occur: Safety/medical concerns ? ? ?   ? ?Blood pressure 107/79, pulse 85, temperature 98.4 ?F (36.9 ?C), resp. rate 14, height 6\' 1"  (1.854 m), weight 86.6 kg, SpO2 96 %. ? ?Medical Problem List and Plan: ?1. Functional deficits secondary to right MCA infarct likely embolic d/t atrial fib. ?-Patient may shower.   ?   -pt to SNF today ? -follow up with me at [facility] in 4-6 weeks. Needs neuro f/u also ?2.  Antithrombotics: ?-DVT/anticoagulation:  Eliquis started on 12/08/21.  Dopplers show no blockage. ?            -antiplatelet therapy:N/A ?-4/15 Continue Eliquis 5 mg daily. ?3. Pain Management for left hip pain: Continue encouraging use of foods from list that aid in pain  management. ?-XR without evidence of fracture. ?-Continue focus on spasticity control and ROM. ?-Continue K pad use ?-Continue Tylenol prn and Voltaren gel as needed. ?-ROM with PT helpful, discussed stretches with pt today   ?4. Mood: LCSW did follow for evaluation and support.   ?-Antipsychotic agents: N/A ?5. Neuropsych: Patient may be intermittently capable of making decisions on his own behalf. ? -increased ritalin to 15mg  bid for attention--> continue. Dc amantadine ?6. Skin/Wound Care: Routine skin checks ?Hydrocortisone for rash on back.  Maculopapular rash is improving. ?7. Fluids/Electrolytes/Nutrition: encourage PO ?-I personally  reviewed the patient's labs today.   ?8. A-fib: Monitor heart rate 3 times daily heart rate continues to remain in the 90s. ?5/2- HR 80s- co't regimen ? ?Vitals:  ? 01/04/22 1947 01/05/22 0550  ?BP: 138/86 107/79  ?Pulse: 91 85  ?Resp: 16 14  ?Temp: 98.1 ?F (36.7 ?C) 98.4 ?F (36.9 ?C)  ?SpO2: 98% 96%  ? ? ?9. Dysphagia: diet upgraded to D2/thins. Tolerating well ?10. Hyperlipidemia for HDL continue Crestor 20 mg. ?  ?11. Bowel incontinence:  ?-5/2 receiving evening suppository@ 2000 ?-having PM bm's, still some incontinence but improved ?12.acute blood loss anemia: Hemoglobin down from 14.4-12.2. ?- Monitor for signs of bleeding. ?- 4/5 Hgb up to 13.8 ?--4/10 Hemoglobin 13.4 ?13.  History of cervical DDD with cord compression: S\P ACDF with residual numbness in bilateral hands. ?14.  Lumbar stenosis: S\P decompression/fusion January 2023. ? . ?15.  Impaired Fasting glucose: Hemoglobin A1c-5.4. ?-Not currently on medication. ?-Continue to trend and monitor. ?16.  Transaminitis: Mild elevation on 4 5. ?Likely reactive with further follow-up. ?4/10 LFTs have returned to baseline. ?17.  Spastic left hemiparesis ?- Resting orthotic in place positioning and stretching per therapy ?-dantrolene 50mg  qid ?-off baclofen ?5/1 increase tizanidine to 4mg  tid--tolerating ?5/2 botox as  outpt ?18.  Hiccups, improved, possibly resolved. ?-off baclofen ?19.  Daytime some somnolence: improved with stimulants and dc of baclofen ? -seems ok with daytime tizanidine now aboard ? -dc amantadine ?20. Inso

## 2022-01-05 NOTE — Progress Notes (Signed)
Occupational Therapy Session Note ? ?Patient Details  ?Name: Patrick Rose ?MRN: [id number] ?Date of Birth: 01/26/1951 ? ?Today's Date: 01/05/2022 ?OT Individual Time: 2951-8841 ?OT Individual Time Calculation (min): 20 min  ? ? ?Short Term Goals: ?Week 4:  OT Short Term Goal 1 (Week 4): Pt will don a shirt with min A ?OT Short Term Goal 2 (Week 4): Pt will complete bed mobility, rolling R and L with no more than mod A overall ?OT Short Term Goal 3 (Week 4): Pt will sit EOB with mod A without UE support ? ?Skilled Therapeutic Interventions/Progress Updates:  ?Pt greeted  supine in bed, pt awaiting transport to SNF however pt  agreeable to OT intervention. Session focus on DC assessments as indicated below, pt continues to present with L inattention needing MOD verbal cues to complete visual assessment accurately. Pt continues to present with no active movement in LUE and no sensation in LUE other than mild tingling feeling in L shoulder. Of note, pt did try to "cheat" on sensation assessments needing MAX cues to complete assessments with 100% validity. Pt continues to present with LUE tone as indicated below, pt reports the wife already has HEP to work on PROM.  Pt handed off to PTAR.                ? ? ?Therapy Documentation ?Precautions:  ?Precautions ?Precautions: Fall ?Precaution Comments: L hemi with controversive pushing to the L, L UE/LE flexor tone, decreased attention ?Restrictions ?Weight Bearing Restrictions: No ?ADL: ?ADL ?Eating: Minimal assistance, Moderate cueing ?Where Assessed-Eating: Bed level ?Grooming: Minimal cueing, Minimal assistance ?Where Assessed-Grooming: Bed level ?Upper Body Bathing: Maximal assistance ?Where Assessed-Upper Body Bathing: Edge of bed ?Lower Body Bathing: Dependent ?Where Assessed-Lower Body Bathing: Bed level ?Upper Body Dressing: Maximal assistance ?Where Assessed-Upper Body Dressing: Edge of bed ?Lower Body Dressing: Dependent ?Toileting: Dependent ?Where  Assessed-Toileting: Bed level ?Toilet Transfer: Unable to assess ?Tub/Shower Transfer Method: Unable to assess ?Vision ?Baseline Vision/History: 1 Wears glasses (all the time) ?Patient Visual Report: No change from baseline ?Vision Assessment?: Yes ?Eye Alignment: Within Functional Limits ?Alignment/Gaze Preference: Chin down;Head turned (head turned L) ?Tracking/Visual Pursuits: Requires cues, head turns, or add eye shifts to track;Decreased smoothness of horizontal tracking;Decreased smoothness of vertical tracking ?Saccades: Additional head turns occurred during testing;Decreased speed of saccadic movement ?Convergence: Within functional limits ?Visual Fields: No apparent deficits ?Perception  ?Perception: Impaired ?Inattention/Neglect: Does not attend to left side of body ?Comments: continues to require cues to look to the L during mobility, relaxed in L rotation at rest ?Praxis ?Praxis: Impaired ?Praxis Impairment Details: Initiation;Motor planning;Perseveration ? ? ? ?Therapy/Group: Individual Therapy ? ?Barron Schmid ?01/05/2022, 12:00 PM ?

## 2022-01-05 NOTE — Progress Notes (Signed)
Speech Language Pathology Discharge Summary ? ?Patient Details  ?Name: Patrick Rose ?MRN: [id number] ?Date of Birth: May 22, 1951 ? ?Patient has met 5 of 7 long term goals.  Patient to discharge at [facility] level.  ? ?Reasons goals not met: Patient requires Min-Mod A verbal cues for use of swallowing compensatory strategies. Patient also discharged earlier than originally estimated due to SNF placement.  ? ?Clinical Impression/Discharge Summary: Patient has made functional gains and has met 5 of 7 LTGs this admission. Currently, patient is consuming Dys. 2 textures with thin liquids with minimal overt s/s of aspiration with Min-Mod verbal cues for use of swallowing compensatory strategies, especially regarding clearing left buccal pocketing. Patient demonstrates improvement in his overall cognitive functioning and requires Min verbal cues for sustained attention, functional problem solving, and emergent awareness. Patient also demonstrates improved speech intelligibility and is ~90-100% intelligible at the conversation level.  [name] family is unable to provide the necessary level of assistance needed at this time, therefore, patient will discharge to a SNF. Patient would benefit from f/u skilled SLP intervention to maximize his cognitive and swallowing function and overall functional independence in order to reduce caregiver burden.  ? ?Care Partner:  ?Caregiver Able to Provide Assistance: No  ?Type of Caregiver Assistance: Physical;Cognitive ? ?Recommendation:  ?Skilled Nursing facility  ?Rationale for SLP Follow Up: Maximize functional communication;Maximize cognitive function and independence;Maximize swallowing safety;Reduce caregiver burden  ? ?Equipment: N/A  ? ?Reasons for discharge: Discharged from hospital  ? ?Patient/Family Agrees with Progress Made and Goals Achieved: Yes  ? ? ?Leeloo Silverthorne ?01/05/2022, 11:57 AM ? ?

## 2022-01-05 NOTE — Progress Notes (Signed)
Report given to nurse at [facility].  ? ?Marylu Lund, RN ? ?

## 2022-01-05 NOTE — Plan of Care (Signed)
?  Problem: RH Swallowing ?Goal: LTG Patient will consume least restrictive diet using compensatory strategies with assistance (SLP) ?Description: LTG:  Patient will consume least restrictive diet using compensatory strategies with assistance (SLP) ?Outcome: Not Met (add Reason) ?Note: Patient continues to require Min-Mod A verbal cues for use of swallowing compensatory strategies  ?Goal: LTG Patient will participate in dysphagia therapy to increase swallow function with assistance (SLP) ?Description: LTG:  Patient will participate in dysphagia therapy to increase swallow function with assistance (SLP) ?Outcome: Not Met (add Reason) ?Note: Patient requires supervision-min A verbal cues to participate in dysphagia treatment  ?  ?Problem: RH Swallowing ?Goal: LTG Pt will demonstrate functional change in swallow as evidenced by bedside/clinical objective assessment (SLP) ?Description: LTG: Patient will demonstrate functional change in swallow as evidenced by bedside/clinical objective assessment (SLP) ?Outcome: Completed/Met ?  ?Problem: RH Expression Communication ?Goal: LTG Patient will increase speech intelligibility (SLP) ?Description: LTG: Patient will increase speech intelligibility at word/phrase/conversation level with cues, % of the time (SLP) ?Outcome: Completed/Met ?  ?Problem: RH Problem Solving ?Goal: LTG Patient will demonstrate problem solving for (SLP) ?Description: LTG:  Patient will demonstrate problem solving for basic/complex daily situations with cues  (SLP) ?Outcome: Completed/Met ?  ?Problem: RH Attention ?Goal: LTG Patient will demonstrate this level of attention during functional activites (SLP) ?Description: LTG:  Patient will will demonstrate this level of attention during functional activites (SLP) ?Outcome: Completed/Met ?  ?Problem: RH Awareness ?Goal: LTG: Patient will demonstrate awareness during functional activites type of (SLP) ?Description: LTG: Patient will demonstrate awareness  during functional activites type of (SLP) ?Outcome: Completed/Met ?  ?

## 2022-01-05 NOTE — Progress Notes (Signed)
Occupational Therapy Discharge Summary ? ?Patient Details  ?Name: Patrick Rose ?MRN: [id number] ?Date of Birth: 1950/09/21 ? ? ? ?Patient has met 5 of 6 long term goals due to improved activity tolerance, improved balance, postural control, and improved awareness.  Pt has made gains with his sitting balance from total A +2 to min A , head posture, sustained attention, ability to follow 1-2 step directions etc. Patient to discharge at overall  max A for ADLs and +2 for transfers for safety mod A   level.  Pt can don shirt with mod A, max A for LB clothing with total A for AFO/shoe. Pt requires max A for bathing at shower level. Pt is able to transfer stand pivot with mod A +2. Pt still continues to be incontient Pt [name] care partner unavailable to provide the necessary physical and cognitive assistance at discharge.  [name] left UE/LE continues to present with tone (Le>UE) and with little active mm activity.  ? ?Reasons goals not met: Pt still requires mod A for UB dressing instead of min A  ? ?Recommendation:  ?Patient will benefit from ongoing skilled OT services in skilled nursing facility setting to continue to advance functional skills in the area of BADL and Reduce care partner burden. ? ?Equipment: ?No equipment provided ? ?Reasons for discharge: treatment goals met and discharge from hospital ? ?Patient/family agrees with progress made and goals achieved: Yes ? ?OT Discharge ?Precautions/Restrictions  ?Precautions ?Precautions: Fall ?Precaution Comments: L hemi with controversive pushing to the L, L UE/LE flexor tone, decreased attention ?Restrictions ?Weight Bearing Restrictions: No ? ?Pain ?Pain Assessment ?Pain Scale: Faces ?Faces Pain Scale: Hurts a little bit ?Pain Type: Chronic pain ?Pain Location: Hip ?Pain Orientation: Left ?Pain Descriptors / Indicators: Aching ?Pain Intervention(s): Other (Comment) (voltaren gel applied) ?ADL ?ADL ?Eating: Minimal assistance, Moderate cueing ?Where  Assessed-Eating: Bed level ?Grooming: Minimal cueing, Minimal assistance ?Where Assessed-Grooming: Bed level ?Upper Body Bathing: Maximal assistance ?Where Assessed-Upper Body Bathing: Edge of bed ?Lower Body Bathing: Dependent ?Where Assessed-Lower Body Bathing: Bed level ?Upper Body Dressing: Maximal assistance ?Where Assessed-Upper Body Dressing: Edge of bed ?Lower Body Dressing: Dependent ?Toileting: Dependent ?Where Assessed-Toileting: Bed level ?Toilet Transfer: Unable to assess ?Tub/Shower Transfer Method: Unable to assess ?Vision ?Baseline Vision/History: 1 Wears glasses (all the time) ?Patient Visual Report: No change from baseline ?Vision Assessment?: Yes ?Eye Alignment: Within Functional Limits ?Alignment/Gaze Preference: Chin down;Head turned (head turned L) ?Tracking/Visual Pursuits: Requires cues, head turns, or add eye shifts to track;Decreased smoothness of horizontal tracking;Decreased smoothness of vertical tracking ?Saccades: Additional head turns occurred during testing;Decreased speed of saccadic movement ?Convergence: Within functional limits ?Visual Fields: No apparent deficits ?Perception  ?Perception: Impaired ?Inattention/Neglect: Does not attend to left side of body ?Comments: continues to require cues to look to the L during mobility, relaxed in L rotation at rest ?Praxis ?Praxis: Impaired ?Praxis Impairment Details: Initiation;Motor planning;Perseveration ?Cognition ?Cognition ?Overall Cognitive Status: Impaired/Different from baseline ?Arousal/Alertness: Awake/alert ?Orientation Level: Place;Person;Situation ?Person: Oriented ?Place: Oriented ?Situation: Oriented ?Memory: Appears intact ?Attention: Sustained ?Sustained Attention: Impaired ?Sustained Attention Impairment: Verbal basic;Functional basic ?Selective Attention: Impaired ?Selective Attention Impairment: Verbal basic;Functional basic ?Awareness: Impaired ?Awareness Impairment: Intellectual impairment ?Problem Solving:  Impaired ?Problem Solving Impairment: Verbal basic;Functional basic ?Behaviors: Perseveration ?Safety/Judgment: Impaired ?Brief Interview for Mental Status (BIMS) ?Repetition of Three Words (First Attempt): 3 ?Temporal Orientation: Year: Correct ?Temporal Orientation: Month: Accurate within 5 days ?Temporal Orientation: Day: Correct ?Recall: "Sock": Yes, no cue required ?Recall: "Blue": Yes, no cue required ?Recall: "Bed": Yes,  no cue required ?BIMS Summary Score: 15 ?Sensation ?Sensation ?Light Touch: Impaired Detail ?Motor  ?Motor ?Motor: Hemiplegia;Abnormal tone;Abnormal postural alignment and control ?Mobility  ?  Pt requires mod A to roll to the left and max to roll to the right. Pt can performed supine to sit with max A to +2. Pt performs stand pivot transfers with mod-max A +2  ?Trunk/Postural Assessment  ?  Pt keeps head turned to the right and flexed  ?Trunk - flexed forward ?Posterior pelvic tilt ?Balance ? Requires min A for sitting balance  ?Max A for standing balance with right Ue support and facilitation to left knee for support.  ?Extremity/Trunk Assessment ?RUE Assessment ?RUE Assessment: Within Functional Limits ?LUE Assessment ?LUE Assessment: Exceptions to [facility] ?Passive Range of Motion (PROM) Comments: Full PROM without pain ?General Strength Comments: Flexor tone and spasms. ?LUE Body System: Neuro ?Brunstrum levels for arm and hand: Arm;Hand ?Brunstrum level for arm: Stage II Synergy is developing ?Brunstrum level for hand: Stage II Synergy is developing ? ? ?Patrick Rose ?01/05/2022, 11:17 AM ?

## 2022-01-05 NOTE — Progress Notes (Signed)
Patient ID: Patrick Rose, male   DOB: [birth year], 71 y.o.   MRN: [id number] ? ?Pt will d/c to SNF [facility]. PTAR to pick up at 11am. Medical team informed, and facility ready to accept pt.  ? ?Cecile Sheerer, MSW, LCSWA ?Office: [phone] ?Cell: [phone] ?Fax: [phone]  ?

## 2022-01-06 NOTE — Progress Notes (Signed)
Inpatient Rehabilitation Care Coordinator ?Discharge Note  ? ?Patient Details  ?Name: Patrick Rose ?MRN: [id number] ?Date of Birth: 1951-01-20 ? ? ?Discharge location: D/c to SNF- [facility] ? ?Length of Stay: 27 days ? ?Discharge activity level: Mod-Max A +1-2 ? ?Home/community participation: Limited ? ?Patient response CB:JSEGBT Literacy - How often do you need to have someone help you when you read instructions, pamphlets, or other written material from your doctor or pharmacy?: Never ? ?Patient response DV:VOHYWV Isolation - How often do you feel lonely or isolated from those around you?: Never ? ?Services provided included: MD, RD, PT, OT, RN, Pharmacy, Neuropsych, SW, TR, CM, SLP ? ?Financial Services:  ?Field seismologist Utilized: Private Insurance ?[employer] ? ?Choices offered to/list presented to: Yes ? ?Follow-up services arranged:  ?Other (Comment) (N/A-SNF) ?   ?  ?  ?  ? ?Patient response to transportation need: ?Is the patient able to respond to transportation needs?: Yes ?In the past 12 months, has lack of transportation kept you from medical appointments or from getting medications?: No ?In the past 12 months, has lack of transportation kept you from meetings, work, or from getting things needed for daily living?: No ? ? ?Comments (or additional information): ? ?Patient/Family verbalized understanding of follow-up arrangements:  Yes ? ?Individual responsible for coordination of the follow-up plan: contact pt wife ? ?Confirmed correct DME delivered: Gretchen Short 01/06/2022   ? ?Gretchen Short ?

## 2022-01-06 NOTE — Progress Notes (Signed)
Inpatient Rehabilitation Admission Medication Review by a Pharmacist ? ?A complete drug regimen review was completed for this patient to identify any potential clinically significant medication issues. ? ?High Risk Drug Classes Is patient taking? Indication by Medication  ?Antipsychotic No   ?Anticoagulant Yes Apixaban- AF  ?Antibiotic No   ?Opioid Yes Tramadol- pain  ?Antiplatelet No   ?Hypoglycemics/insulin No   ?Vasoactive Medication No   ?Chemotherapy No   ?Other Yes Protonix- GERD ?Crestor- HLD ?Dantrolene- spacticity  ?Melatonin- sleep ?Methylphenidate- alertness ?Tizanidine- muscle spasms  ? ? ? ?Type of Medication Issue Identified Description of Issue Recommendation(s)  ?Drug Interaction(s) (clinically significant) ?    ?Duplicate Therapy ?    ?Allergy ?    ?No Medication Administration End Date ?    ?Incorrect Dose ?    ?Additional Drug Therapy Needed ?    ?Significant med changes from prior encounter (inform family/care partners about these prior to discharge).    ?Other ?    ? ? ?Clinically significant medication issues were identified that warrant physician communication and completion of prescribed/recommended actions by midnight of the next day:  No ? ?Time spent performing this drug regimen review (minutes):  30 ? ? ?Thank you for allowing pharmacy to be a part of this patient?s care. ? ?Thelma Barge, PharmD ?Clinical Pharmacist ? ?

## 2022-02-04 ENCOUNTER — Telehealth: Payer: Self-pay | Admitting: *Deleted

## 2022-02-04 NOTE — Telephone Encounter (Signed)
Patrick Rose called and is asking if Patrick Rose can help get Patrick Rose into the [facility].  He is currently at [facility] and needs more intensive Rehab.  Please advise.

## 2022-03-03 ENCOUNTER — Encounter: Payer: Medicare HMO | Attending: Physical Medicine & Rehabilitation | Admitting: Physical Medicine & Rehabilitation

## 2022-03-03 ENCOUNTER — Encounter: Payer: Self-pay | Admitting: Physical Medicine & Rehabilitation

## 2022-03-03 VITALS — BP 115/78 | HR 62 | Ht 74.0 in | Wt 160.0 lb

## 2022-03-03 DIAGNOSIS — M7502 Adhesive capsulitis of left shoulder: Secondary | ICD-10-CM | POA: Diagnosis present

## 2022-03-03 DIAGNOSIS — I6601 Occlusion and stenosis of right middle cerebral artery: Secondary | ICD-10-CM | POA: Insufficient documentation

## 2022-03-03 DIAGNOSIS — R1312 Dysphagia, oropharyngeal phase: Secondary | ICD-10-CM | POA: Insufficient documentation

## 2022-03-03 DIAGNOSIS — M7062 Trochanteric bursitis, left hip: Secondary | ICD-10-CM | POA: Insufficient documentation

## 2022-03-03 NOTE — Patient Instructions (Addendum)
LEFT SHOULDER:  YOU NEED TO KEEP ARM ELEVATED ON PILLOW OR ARM REST WHILE IN CHAIR/BED.  REGULAR RANGE OF MOTION EVERY DAY!  I THINK TRAMADOL 50MG  30 MINUTES BEFORE THERAPY    LEFT HIP:  ICE TO LEFT HIP FOR PAIN RELIEF  REGULAR STRETCHING     IF MIRTAZIPINE  DOESN'T HELP APPETITE: TRY MEGACE 400MG  BID

## 2022-03-03 NOTE — Progress Notes (Signed)
Subjective:    Patient ID: Patrick Rose, male    DOB: 03/31/1951, 71 y.o.   MRN: [id number]  HPI Patrick Rose is here in follow-up of his right MCA infarct.  He was with [place] until early May and then transition to skilled nursing.  He ended up changing to [facility] skilled nursing facility after dissatisfaction with the initial venue.  He still is not getting as much therapy as he would hope.  It sounds as if they are working on some range of motion with him as well as his pain control etc.  He states that he is having a left shoulder pain which is worsening.  Left hip has ongoing pain and was a problem while he was with [place] on rehab.  There is some existing tone in the left lower extremity which has been a barrier.  For spasticity he has been on Dantrium 50 mg 4 times daily.  It appears that he has been taken off tizanidine.  He remains on Eliquis for anticoagulation.  He was on tramadol previously for pain control but apparently was stopped due to concerns over effects on his appetite.  He was placed on Remeron 15 mg at bedtime recently to improve his appetite.  He says he does not like the food.  Patient and family had questions today about his prognosis and recovery.  The patient self is frustrated that he is not getting better sooner.  He is anxious to be walking again.  Pain Inventory Average Pain 4 Pain Right Now 3 My pain is intermittent and aching  LOCATION OF PAIN  shoulder, buttocks, hip, leg, ribs  BOWEL Number of stools per week: 7 Oral laxative use No  Type of laxative . Enema or suppository use No  History of colostomy No  Incontinent No   BLADDER Pads In and out cath, frequency . Able to self cath  . Bladder incontinence No  Frequent urination No  Leakage with coughing No  Difficulty starting stream No  Incomplete bladder emptying No    Mobility how many minutes can you walk? 0 ability to climb steps?  no do you drive?  no use a wheelchair needs help with  transfers  Function retired I need assistance with the following:  SNF-[facility]  Neuro/Psych weakness numbness tremor tingling spasms depression  Prior Studies Hospital f/u  Physicians involved in your care Hospital f/u   Family History  Problem Relation Age of Onset   Cancer Mother    Cancer Father    Cancer Sister    Cancer Brother    Social History   Socioeconomic History   Marital status: Married    Spouse name: Not on file   Number of children: Not on file   Years of education: Not on file   Highest education level: Not on file  Occupational History   Not on file  Tobacco Use   Smoking status: Never   Smokeless tobacco: Never  Vaping Use   Vaping Use: Never used  Substance and Sexual Activity   Alcohol use: Not Currently   Drug use: Never   Sexual activity: Not Currently  Other Topics Concern   Not on file  Social History Narrative   Not on file   Social Determinants of Health   Financial Resource Strain: Not on file  Food Insecurity: Not on file  Transportation Needs: Not on file  Physical Activity: Not on file  Stress: Not on file  Social Connections: Not on file  Past Surgical History:  Procedure Laterality Date   ANTERIOR CERVICAL DECOMP/DISCECTOMY FUSION  2008   APPENDECTOMY     BACK SURGERY     IR CT HEAD LTD  12/03/2021   IR PERCUTANEOUS ART THROMBECTOMY/INFUSION INTRACRANIAL INC DIAG ANGIO  12/03/2021   RADIOLOGY WITH ANESTHESIA N/A 12/03/2021   Procedure: IR WITH ANESTHESIA;  Surgeon: Patrick Cotton, MD;  Location: [facility] OR;  Service: Radiology;  Laterality: N/A;   Past Medical History:  Diagnosis Date   Afib (HCC)    Cervical myelopathy (HCC)    Lumbar stenosis 09/2021   with pain radiatiing down buttocks to thighs   BP 115/78   Pulse 62   Ht 6\' 2"  (1.88 m)   Wt 160 lb (72.6 kg)   SpO2 92%   BMI 20.54 kg/m   Opioid Risk Score:   Fall Risk Score:  `1  Depression screen PHQ 2/9      No data to display             Review of Systems  Musculoskeletal:        Leg, shoulder, buttocks, hip, rib pain  Neurological:  Positive for tremors, weakness and numbness.  All other systems reviewed and are negative.      Objective:   Physical Exam General: No acute distress HEENT: NCAT, EOMI, oral membranes moist Cards: reg rate  Chest: normal effort Abdomen: Soft, NT, ND Skin: dry, intact Extremities: no edema Psych: pleasant and appropriate Neuro: Patient is alert oriented x3 essentially.  He has a left central seventh tongue deviation.  Speech is dysarthric.  Left upper extremity demonstrates trace movements but really restricted by pain.  Left lower extremity also with trace movement approximately.  He does seem to have intact general pain sensation in the left arm and leg but fine touch is inconsistent..  He does have some resting tone in the left upper and left lower at 1+ to 2 out of 4.  More flexor tone in the left upper and extensor tone in the left lower. Musc: Left shoulder demonstrates some anterior subluxation.  He has pain with abduction of more than 30 or 40 degrees.  He also is tender with internal and external rotation of the shoulder.  He had minimal pain with palpation of the shoulder itself today.  Left hip was notable for pain along the greater trochanter and was more tender with cross leg maneuver as well as flexion of the hip.  He complained of left great toe tenderness but I saw no gross abnormalities there.       Assessment & Plan:  Right MCA infarct with dense left hemiparesis and hemisensory loss A/c: eliquis Cognition -ritalin 4. Dysphagia 5. Hx of cervical DDD w/ myelopathy s/p ACDF 6. Spastic left hemiparesis:  -Continue dantrolene  -Needs to be more aggressive with range of motion 7. Insomnia: Remeron is fine for sleep for now as well as melatonin. 8. Adhesive capsulitis left shoulder  After informed consent and preparation of the skin with betadine and isopropyl  alcohol, I injected 6mg  (1cc) of [place]estone and 4cc of 1% lidocaine into the left subacromial space via posterior approach. Additionally, aspiration was performed prior to injection. The patient tolerated well, and no complications were encountered. Afterward the area was cleaned and dressed. Post- injection instructions were provided.    -Discussed the fact that needs to work on daily stretching of the left shoulder with therapy and family.  -Left shoulder needs to be elevated when he is in  his chair or in the bed to prevent further subluxation of the shoulder. 9.  Left greater troch bursitis:  After informed consent and preparation of the skin with betadine and isopropyl alcohol, I injected 6mg  (1cc) of celestone and 4cc of 1% lidocaine around the left greater troch via lateral approach. Additionally, aspiration was performed prior to injection. The patient tolerated well, and no complications were encountered. Afterward the area was cleaned and dressed. Post- injection instructions were provided.   -Discussed general stretching for the left hip as well as spasticity control  -Ice to the left hip would be beneficial for pain  30 minutes of face to face patient care time were spent during this visit. All questions were encouraged and answered.  Follow up with me in 2 mos  .

## 2022-03-16 ENCOUNTER — Inpatient Hospital Stay: Payer: Medicare HMO | Admitting: Neurology

## 2022-03-17 ENCOUNTER — Other Ambulatory Visit: Payer: Self-pay

## 2022-03-17 NOTE — Patient Outreach (Addendum)
[facility]) Care Management  03/17/2022  Patrick Rose 10-24-1950 [id number]   First telephone outreach attempt to obtain mRS. No answer. Unable to leave message for returned call.  [name] [facility] Management Assistant [phone]

## 2022-03-19 ENCOUNTER — Other Ambulatory Visit: Payer: Self-pay

## 2022-03-19 NOTE — Patient Outreach (Signed)
[facility]) Care Management  03/19/2022  Patrick Rose 03-01-1951 [id number]   Second telephone outreach attempt to obtain mRS. No answer. Unable to leave message for returned call.  [name] [facility] Management Assistant [phone]

## 2022-03-23 ENCOUNTER — Other Ambulatory Visit: Payer: Self-pay

## 2022-03-23 NOTE — Patient Outreach (Signed)
[facility]) Care Management  03/23/2022  Patrick Rose March 16, 1951 [id number]   Telephone outreach to patient to obtain mRS was successfully completed. MRS= 4  [name] [facility] Care Management Assistant [phone]

## 2022-05-26 ENCOUNTER — Encounter: Payer: Self-pay | Admitting: Physical Medicine & Rehabilitation

## 2022-05-26 ENCOUNTER — Encounter: Payer: Medicare HMO | Attending: Physical Medicine & Rehabilitation | Admitting: Physical Medicine & Rehabilitation

## 2022-05-26 VITALS — BP 126/85 | HR 84 | Ht 74.0 in

## 2022-05-26 DIAGNOSIS — M7502 Adhesive capsulitis of left shoulder: Secondary | ICD-10-CM

## 2022-05-26 DIAGNOSIS — M7062 Trochanteric bursitis, left hip: Secondary | ICD-10-CM

## 2022-05-26 DIAGNOSIS — I6601 Occlusion and stenosis of right middle cerebral artery: Secondary | ICD-10-CM

## 2022-05-26 NOTE — Patient Instructions (Addendum)
YOU HAVE TO BE STRETCHING BOTH YOUR LEFT SHOULDER AND HIP EACH DAY.    APPLY ICE FOR PAIN 30 MINUTES 2-3 X PER DAY   YOU CAN USE HEAT PRIOR TO STRETCHING, 30 MINUTES PRIOR   PERHAPS YOU CAN TRY HYDROCODONE 5/325: 1/2 TO 1 TABLET 30 MINUTES BEFORE STRETCHING/THERAPY.

## 2022-05-26 NOTE — Progress Notes (Signed)
Subjective:    Patient ID: Patrick Rose, male    DOB: 12/10/1950, 71 y.o.   MRN: [id number]  HPI  Mr. Pernell Dupredams is here in follow up of his cva. He continues with therapy at his facility.  Progress continues to be slow.  It sounds as if they are working on some pre-gait activities such as sitting edge of bed and standing.  We injected his left shoulder and left hip at last visit and he reports that he did not have much relief with either.  I asked him if therapy is working on some stretching of these areas and he says they have from time to time.  He does have some exercises they have given him as well.  He is not doing them on a regular basis.  They have tried a little bit of tramadol prior to therapies reducing the dose recently to half tablet due to sedation.  This does seem to help with pain tolerance during activity.  Sounds as if he might have used heat on occasion.  He does use IcyHot for his shoulder and hip with some benefit.  He is accompanied by his wife and daughter again today.  He remains at the facility not sure what the disposition plan is.  It sounds as if they are backing off therapy intensity at this point.  Pain Inventory Average Pain 3 Pain Right Now 5 My pain is burning, stabbing, and aching  LOCATION OF PAIN  back, buttocks  BOWEL Number of stools per week: 7   BLADDER Normal    Mobility ability to climb steps?  no do you drive?  no use a wheelchair needs help with transfers Do you have any goals in this area?  yes  Function retired I need assistance with the following:  dressing, bathing, toileting, meal prep, household duties, and shopping  Neuro/Psych bladder control problems weakness depression  Prior Studies Any changes since last visit?  no  Physicians involved in your care Any changes since last visit?  no   Family History  Problem Relation Age of Onset   Cancer Mother    Cancer Father    Cancer Sister    Cancer Brother    Social  History   Socioeconomic History   Marital status: Married    Spouse name: Not on file   Number of children: Not on file   Years of education: Not on file   Highest education level: Not on file  Occupational History   Not on file  Tobacco Use   Smoking status: Never   Smokeless tobacco: Never  Vaping Use   Vaping Use: Never used  Substance and Sexual Activity   Alcohol use: Not Currently   Drug use: Never   Sexual activity: Not Currently  Other Topics Concern   Not on file  Social History Narrative   Not on file   Social Determinants of Health   Financial Resource Strain: Not on file  Food Insecurity: Not on file  Transportation Needs: Not on file  Physical Activity: Not on file  Stress: Not on file  Social Connections: Not on file   Past Surgical History:  Procedure Laterality Date   ANTERIOR CERVICAL DECOMP/DISCECTOMY FUSION  2008   APPENDECTOMY     BACK SURGERY     IR CT HEAD LTD  12/03/2021   IR PERCUTANEOUS ART THROMBECTOMY/INFUSION INTRACRANIAL INC DIAG ANGIO  12/03/2021   RADIOLOGY WITH ANESTHESIA N/A 12/03/2021   Procedure: IR WITH ANESTHESIA;  Surgeon: Luanne Bras, MD;  Location: [facility];  Service: Radiology;  Laterality: N/A;   Past Medical History:  Diagnosis Date   Afib ([place])    Cervical myelopathy ([facility])    Lumbar stenosis 09/2021   with pain radiatiing down buttocks to thighs   BP 126/85   Pulse 84   Ht 6\' 2"  (1.88 m)   SpO2 93%   BMI 20.54 kg/m   Opioid Risk Score:   Fall Risk Score:  `1  Depression screen [facility] 2/9     05/26/2022    3:51 PM  Depression screen PHQ 2/9  Decreased Interest 3  Down, Depressed, Hopeless 3  PHQ - 2 Score 6     Review of Systems  Constitutional: Negative.   HENT: Negative.    Eyes: Negative.   Respiratory: Negative.    Cardiovascular: Negative.   Gastrointestinal: Negative.   Endocrine: Negative.   Genitourinary:  Positive for difficulty urinating.  Musculoskeletal:  Positive for back pain and  gait problem.  Skin: Negative.   Allergic/Immunologic: Negative.   Neurological:  Positive for weakness.  Hematological: Negative.   Psychiatric/Behavioral:  Positive for dysphoric mood.       Objective:   Physical Exam  General: No acute distress HEENT: NCAT, EOMI, oral membranes moist Cards: reg rate  Chest: normal effort Abdomen: Soft, NT, ND Skin: dry, intact Extremities: no edema Psych: Generally appropriate but can be a bit ornery Neuro: Patient is alert and oriented x3.  He still has left central 7 with dysarthric speech.  He has trace movements in the left upper extremity as well as left lower extremity.  He does have intact gross touch in the left arm and leg.  Reflexes are brisk at 2+ to 3+.  He has minimal resting tone.. Musc:  Left shoulder still is tender however I was able to abduct him to 70 to 75 degrees which was an improvement.  He still has pain with internal and external rotation as well.  The left hip is tender at the greater trochanter and with cross leg maneuver once again today.           Assessment & Plan:  Right MCA infarct with dense left hemiparesis and hemisensory loss A/c: eliquis Cognition -He is off Ritalin now 4. Dysphagia 5. Hx of cervical DDD w/ myelopathy s/p ACDF 6. Spastic left hemiparesis:             -Continue dantrolene which seems to be controlling his baseline tone             -Still is not being regular enough with his stretching program 7. Insomnia: Remeron is fine for sleep for now as well as melatonin. 8. Adhesive capsulitis left shoulder                             -Needs to stretch the left shoulder daily.  I demonstrated some basic stretches today.  He has improved but needs to demonstrate further improvement before his pain levels subside.  I am not inclined to perform another injection today given the equivocal benefits of the last 1.            -Continue left shoulder elevation in the chair in bed.  Sling as needed for  support being careful not to further restrict shoulder range of motion. 9.  Left greater troch bursitis:             Demonstrated basic  Stretches once again today.  These also need to be done at least daily if not twice daily.  I recommended trying heat before stretching as well as ice as needed for pain.    -In general he might benefit from something like hydrocodone prior to stretching as opposed to tramadol to see if he has a better pain and tolerance profile.   30 minutes of face to face patient care time were spent during this visit. All questions were encouraged and answered.  Follow up with me in 3 months

## 2022-06-09 ENCOUNTER — Inpatient Hospital Stay: Payer: Medicare HMO | Admitting: Neurology

## 2022-08-25 ENCOUNTER — Encounter: Payer: Medicare HMO | Admitting: Physical Medicine & Rehabilitation

## 2022-09-04 IMAGING — CT CT HEAD W/O CM
4 series · 16 of 47 positions shown, 18 images · non-contrast
Comparison: 12/08/2021

CLINICAL DATA: Stroke, follow up; new tingling along the left side
of face since noon



[Series 3: head wo · axial · 0.48mm/px · z∈[-134,+11]mm · 7 of 39 slices shown, 9 images]
[im 5/39  brain]
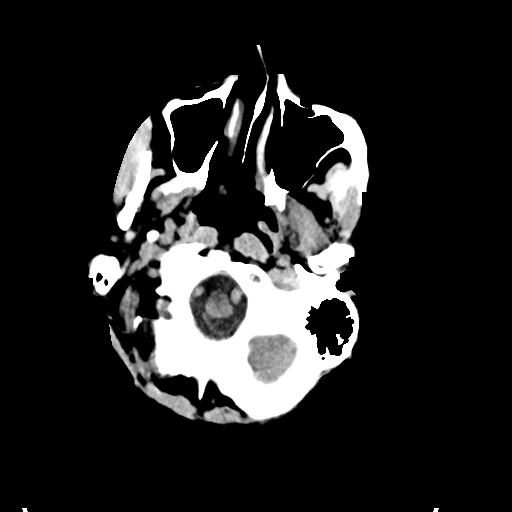
[im 5/39  bone]
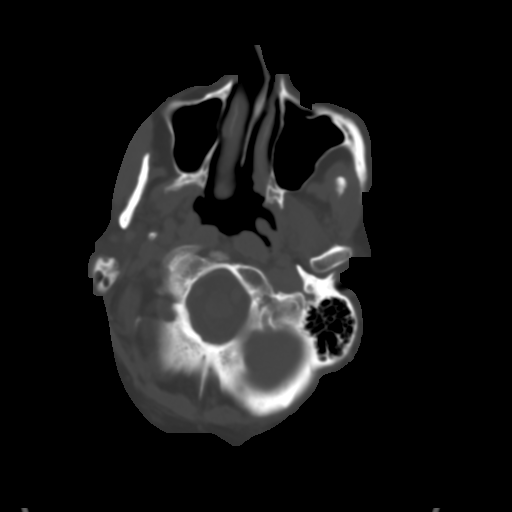
[im 10/39  brain]
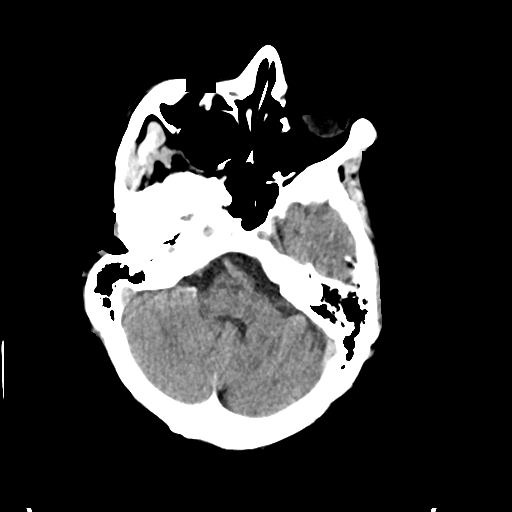
[im 15/39  brain]
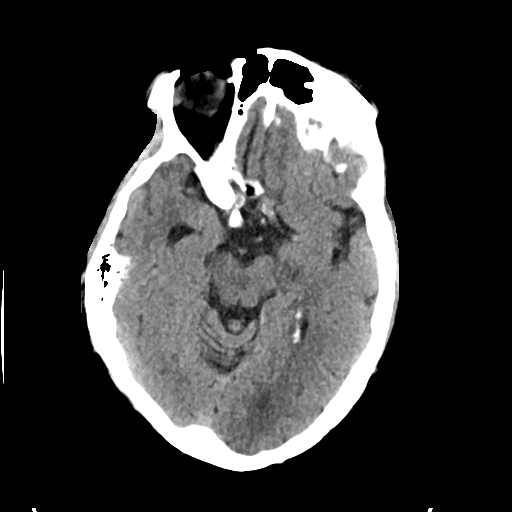
[im 20/39  brain]
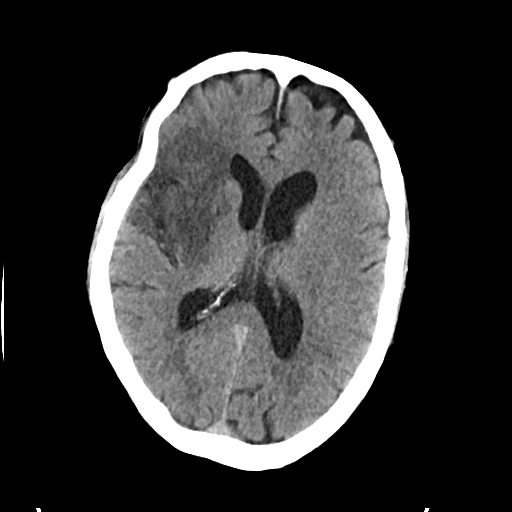
[im 24/39  brain]
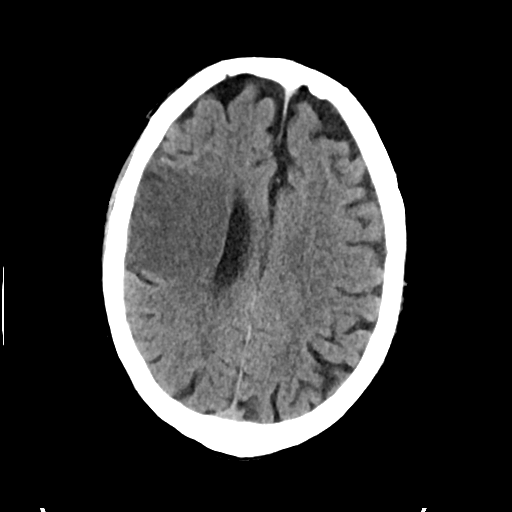
[im 24/39  bone]
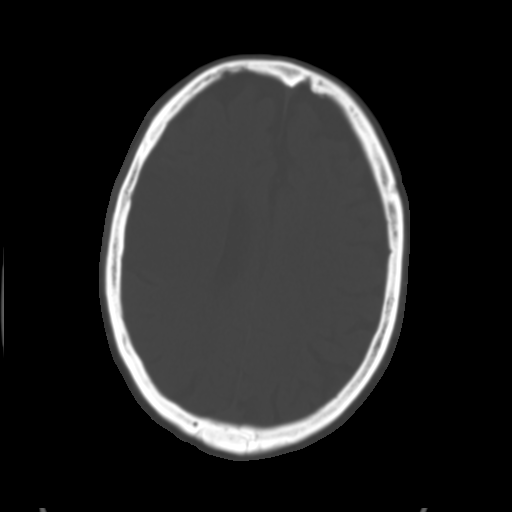
[im 29/39  brain]
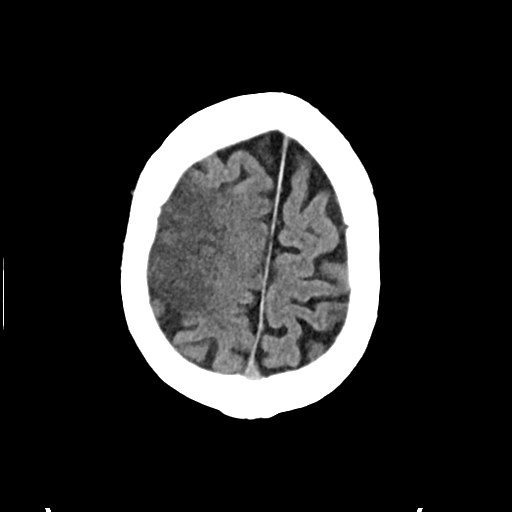
[im 34/39  brain]
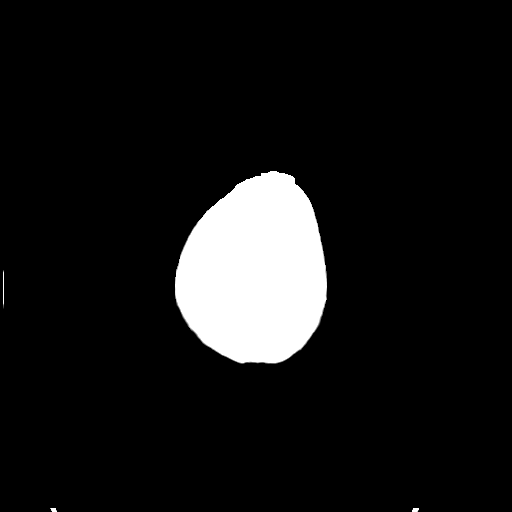

[Series 4: head bone · axial · 0.48mm/px · z∈[-136,-98]mm · 3 of 97 slices shown]
[im 10/97  bone]
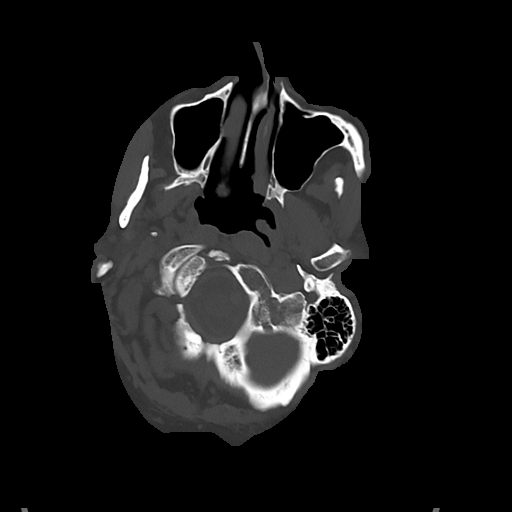
[im 20/97  bone]
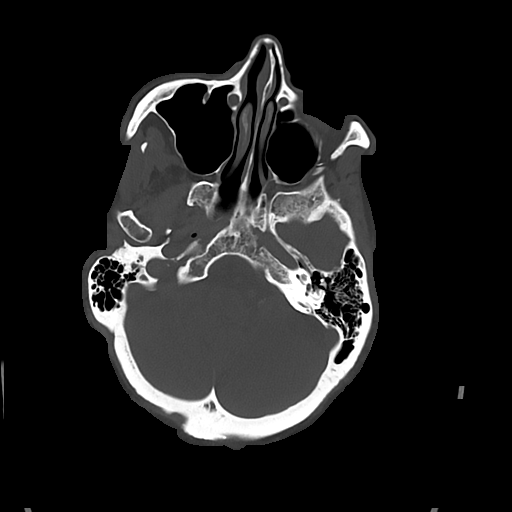
[im 29/97  bone]
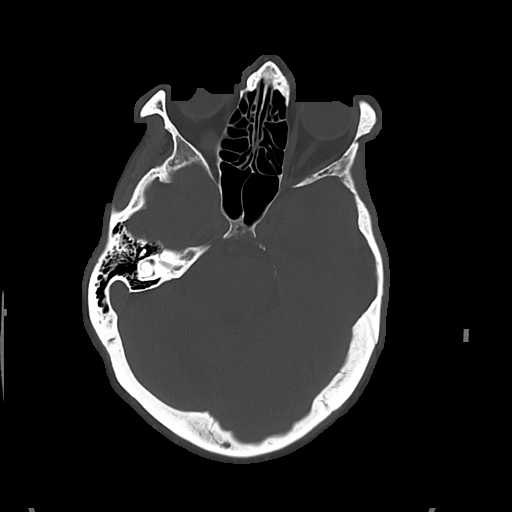

[Series 5: cor soft · coronal · 0.36mm/px · 3 of 75 slices shown]
[im 25/75  brain]
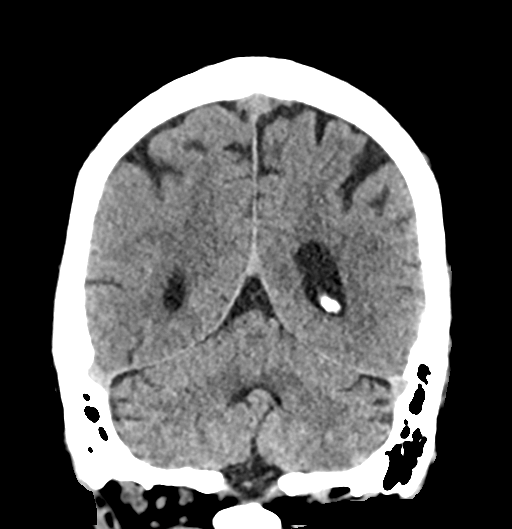
[im 33/75  brain]
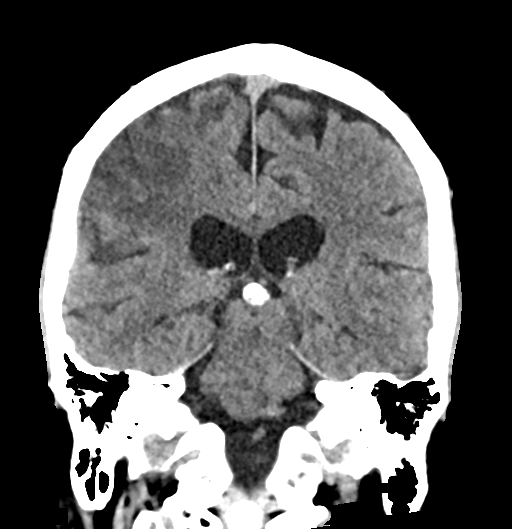
[im 42/75  brain]
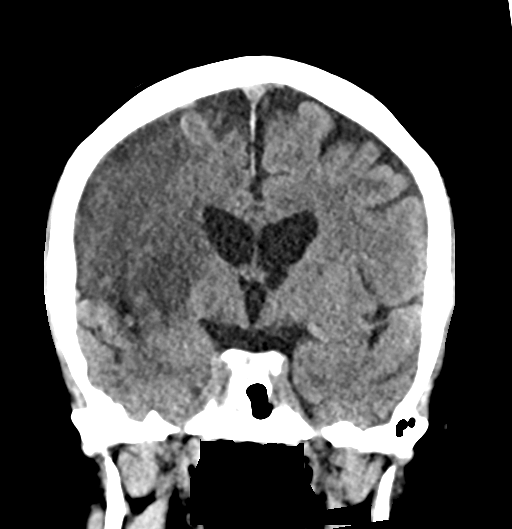

[Series 6: sag soft · sagittal · 0.37mm/px · 3 of 61 slices shown]
[im 24/61  brain]
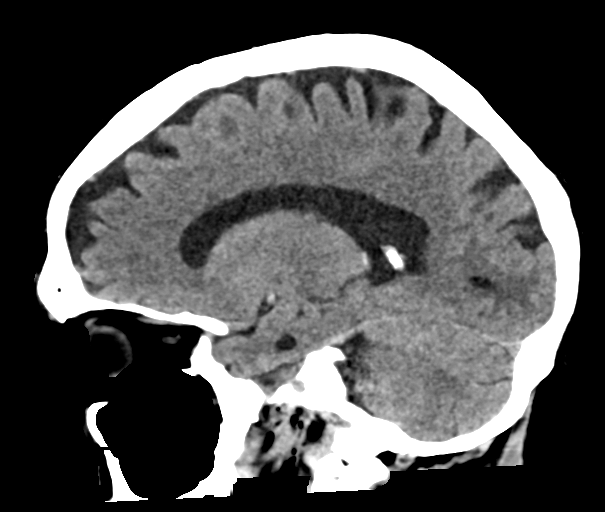
[im 31/61  brain]
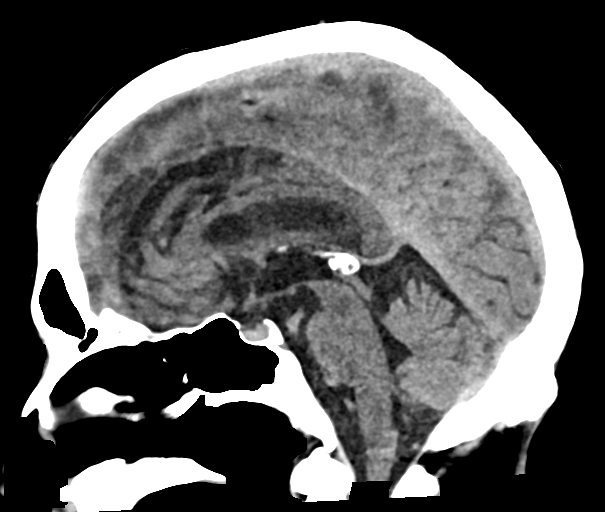
[im 38/61  brain]
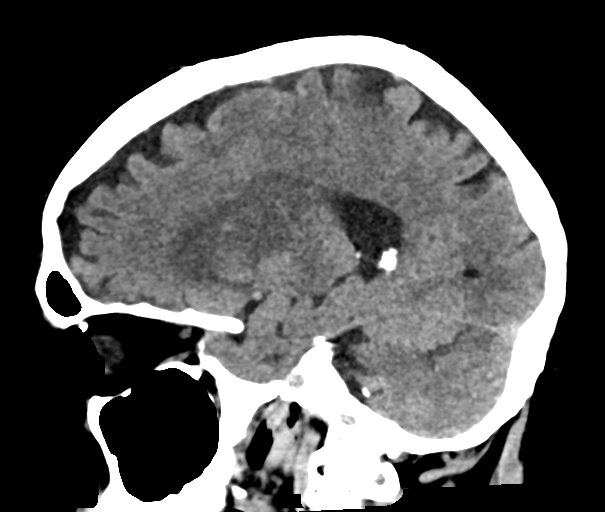

[16 of 47 positions shown; findings below may reference images not displayed]

FINDINGS: Brain: Evolution of right MCA territory infarction involving the
frontal lobe with some parietal extension, basal ganglia, and
insula. Similar evolution of right ACA territory infarction
involving the frontal lobe. Edema and mass effect have significantly
improved. Mildly increased density may reflect presence of petechial
hemorrhage, but there is no discrete hematoma.

There is no new loss of gray-white differentiation separate from
above. Probable wallerian degeneration along the right corticospinal
tract.

Vascular: No new finding.

Skull: Calvarium is unremarkable.

Sinuses/Orbits: No acute finding.

Other: None.
IMPRESSION: Expected evolution of right MCA and ACA territory infarctions. There
may be some petechial hemorrhage, but there is no discrete hematoma.
Mass effect is significantly improved.

## 2024-03-03 ENCOUNTER — Encounter (HOSPITAL_COMMUNITY): Payer: Self-pay | Admitting: Interventional Radiology
